# Patient Record
Sex: Female | Born: 1950
Health system: Southern US, Community
[De-identification: ages and names within clinical notes are randomized; demographics above are authoritative.]

## PROBLEM LIST (undated history)

## (undated) DIAGNOSIS — R51 Headache: Secondary | ICD-10-CM

## (undated) DIAGNOSIS — N183 Chronic kidney disease, stage 3 unspecified: Secondary | ICD-10-CM

## (undated) DIAGNOSIS — N3641 Hypermobility of urethra: Secondary | ICD-10-CM

## (undated) DIAGNOSIS — Z860101 Personal history of adenomatous and serrated colon polyps: Secondary | ICD-10-CM

## (undated) DIAGNOSIS — M542 Cervicalgia: Secondary | ICD-10-CM

## (undated) DIAGNOSIS — E119 Type 2 diabetes mellitus without complications: Secondary | ICD-10-CM

## (undated) DIAGNOSIS — E7800 Pure hypercholesterolemia, unspecified: Secondary | ICD-10-CM

## (undated) DIAGNOSIS — C50919 Malignant neoplasm of unspecified site of unspecified female breast: Secondary | ICD-10-CM

## (undated) DIAGNOSIS — I10 Essential (primary) hypertension: Secondary | ICD-10-CM

## (undated) DIAGNOSIS — G4733 Obstructive sleep apnea (adult) (pediatric): Secondary | ICD-10-CM

## (undated) DIAGNOSIS — E039 Hypothyroidism, unspecified: Secondary | ICD-10-CM

## (undated) DIAGNOSIS — Z9289 Personal history of other medical treatment: Secondary | ICD-10-CM

## (undated) DIAGNOSIS — R7303 Prediabetes: Secondary | ICD-10-CM

## (undated) DIAGNOSIS — R9389 Abnormal findings on diagnostic imaging of other specified body structures: Secondary | ICD-10-CM

## (undated) DIAGNOSIS — D649 Anemia, unspecified: Secondary | ICD-10-CM

## (undated) DIAGNOSIS — M1990 Unspecified osteoarthritis, unspecified site: Secondary | ICD-10-CM

## (undated) DIAGNOSIS — N814 Uterovaginal prolapse, unspecified: Secondary | ICD-10-CM

## (undated) DIAGNOSIS — M1A9XX0 Chronic gout, unspecified, without tophus (tophi): Secondary | ICD-10-CM

## (undated) DIAGNOSIS — Z86010 Personal history of colonic polyps: Secondary | ICD-10-CM

## (undated) DIAGNOSIS — Z8719 Personal history of other diseases of the digestive system: Secondary | ICD-10-CM

## (undated) DIAGNOSIS — E782 Mixed hyperlipidemia: Secondary | ICD-10-CM

## (undated) DIAGNOSIS — G4730 Sleep apnea, unspecified: Secondary | ICD-10-CM

## (undated) DIAGNOSIS — I441 Atrioventricular block, second degree: Secondary | ICD-10-CM

## (undated) HISTORY — DX: Personal history of other medical treatment: Z92.89

## (undated) HISTORY — PX: ABDOMINAL HYSTERECTOMY: SHX81

## (undated) HISTORY — DX: Atrioventricular block, second degree: I44.1

## (undated) HISTORY — PX: REPAIR TENDONS FOOT: SUR1209

---

## 1972-07-28 HISTORY — PX: TUBAL LIGATION: SHX77

## 1980-07-28 DIAGNOSIS — C50919 Malignant neoplasm of unspecified site of unspecified female breast: Secondary | ICD-10-CM

## 1980-07-28 DIAGNOSIS — C801 Malignant (primary) neoplasm, unspecified: Secondary | ICD-10-CM

## 1980-07-28 DIAGNOSIS — Z853 Personal history of malignant neoplasm of breast: Secondary | ICD-10-CM

## 1980-07-28 HISTORY — DX: Personal history of malignant neoplasm of breast: Z85.3

## 1980-07-28 HISTORY — DX: Malignant neoplasm of unspecified site of unspecified female breast: C50.919

## 1980-07-28 HISTORY — DX: Malignant (primary) neoplasm, unspecified: C80.1

## 1980-07-28 HISTORY — PX: MASTECTOMY: SHX3

## 1981-07-28 HISTORY — PX: BREAST RECONSTRUCTION: SHX9

## 1987-07-29 HISTORY — PX: OTHER SURGICAL HISTORY: SHX169

## 1997-07-28 HISTORY — PX: REPAIR TENDONS FOOT: SUR1209

## 1998-07-28 HISTORY — PX: REPAIR TENDONS FOOT: SUR1209

## 1999-06-03 ENCOUNTER — Other Ambulatory Visit: Admission: RE | Admit: 1999-06-03 | Discharge: 1999-06-03 | Payer: Self-pay | Admitting: Gynecology

## 2000-06-11 ENCOUNTER — Other Ambulatory Visit: Admission: RE | Admit: 2000-06-11 | Discharge: 2000-06-11 | Payer: Self-pay | Admitting: Gynecology

## 2001-05-19 ENCOUNTER — Encounter (HOSPITAL_BASED_OUTPATIENT_CLINIC_OR_DEPARTMENT_OTHER): Payer: Self-pay | Admitting: Internal Medicine

## 2001-05-19 ENCOUNTER — Encounter: Admission: RE | Admit: 2001-05-19 | Discharge: 2001-05-19 | Payer: Self-pay | Admitting: Internal Medicine

## 2001-06-29 ENCOUNTER — Ambulatory Visit (HOSPITAL_COMMUNITY): Admission: RE | Admit: 2001-06-29 | Discharge: 2001-06-29 | Payer: Self-pay | Admitting: Gastroenterology

## 2001-09-30 ENCOUNTER — Other Ambulatory Visit: Admission: RE | Admit: 2001-09-30 | Discharge: 2001-09-30 | Payer: Self-pay | Admitting: Gynecology

## 2002-10-31 ENCOUNTER — Other Ambulatory Visit: Admission: RE | Admit: 2002-10-31 | Discharge: 2002-10-31 | Payer: Self-pay | Admitting: Gynecology

## 2003-11-02 ENCOUNTER — Other Ambulatory Visit: Admission: RE | Admit: 2003-11-02 | Discharge: 2003-11-02 | Payer: Self-pay | Admitting: Gynecology

## 2004-12-05 ENCOUNTER — Other Ambulatory Visit: Admission: RE | Admit: 2004-12-05 | Discharge: 2004-12-05 | Payer: Self-pay | Admitting: Gynecology

## 2005-07-15 ENCOUNTER — Encounter: Admission: RE | Admit: 2005-07-15 | Discharge: 2005-07-15 | Payer: Self-pay | Admitting: Internal Medicine

## 2011-06-10 ENCOUNTER — Other Ambulatory Visit: Payer: Self-pay | Admitting: Gynecology

## 2011-06-10 ENCOUNTER — Ambulatory Visit
Admission: RE | Admit: 2011-06-10 | Discharge: 2011-06-10 | Disposition: A | Discharge status: Home / Self Care | Payer: Federal, State, Local not specified - PPO | Source: Ambulatory Visit | Attending: Gynecology | Admitting: Gynecology

## 2011-06-10 DIAGNOSIS — R1900 Intra-abdominal and pelvic swelling, mass and lump, unspecified site: Secondary | ICD-10-CM

## 2011-06-17 ENCOUNTER — Other Ambulatory Visit: Payer: Self-pay | Admitting: Gastroenterology

## 2011-06-17 DIAGNOSIS — K7689 Other specified diseases of liver: Secondary | ICD-10-CM

## 2011-06-26 ENCOUNTER — Ambulatory Visit
Admission: RE | Admit: 2011-06-26 | Discharge: 2011-06-26 | Disposition: A | Discharge status: Home / Self Care | Payer: Federal, State, Local not specified - PPO | Source: Ambulatory Visit | Attending: Gastroenterology | Admitting: Gastroenterology

## 2011-06-26 DIAGNOSIS — K7689 Other specified diseases of liver: Secondary | ICD-10-CM

## 2011-06-26 MED ORDER — GADOBENATE DIMEGLUMINE 529 MG/ML IV SOLN
20.0000 mL | Freq: Once | INTRAVENOUS | Status: AC | PRN
  Administered 2011-06-26: 20 mL via INTRAVENOUS

## 2011-07-29 HISTORY — PX: COLONOSCOPY: SHX174

## 2011-09-22 ENCOUNTER — Other Ambulatory Visit: Payer: Self-pay | Admitting: Gynecology

## 2011-09-22 DIAGNOSIS — R1900 Intra-abdominal and pelvic swelling, mass and lump, unspecified site: Secondary | ICD-10-CM

## 2011-09-25 ENCOUNTER — Ambulatory Visit
Admission: RE | Admit: 2011-09-25 | Discharge: 2011-09-25 | Disposition: A | Discharge status: Home / Self Care | Payer: Federal, State, Local not specified - PPO | Source: Ambulatory Visit | Attending: Gynecology | Admitting: Gynecology

## 2011-09-25 DIAGNOSIS — R1900 Intra-abdominal and pelvic swelling, mass and lump, unspecified site: Secondary | ICD-10-CM

## 2011-10-15 ENCOUNTER — Other Ambulatory Visit: Payer: Self-pay | Admitting: Obstetrics and Gynecology

## 2011-10-24 ENCOUNTER — Encounter (HOSPITAL_COMMUNITY): Payer: Self-pay

## 2011-11-06 ENCOUNTER — Encounter (HOSPITAL_COMMUNITY): Payer: Self-pay

## 2011-11-06 ENCOUNTER — Encounter (HOSPITAL_COMMUNITY)
Admission: RE | Admit: 2011-11-06 | Discharge: 2011-11-06 | Disposition: A | Discharge status: Home / Self Care | Payer: Federal, State, Local not specified - PPO | Source: Ambulatory Visit | Attending: Obstetrics and Gynecology | Admitting: Obstetrics and Gynecology

## 2011-11-06 HISTORY — DX: Pure hypercholesterolemia, unspecified: E78.00

## 2011-11-06 HISTORY — DX: Essential (primary) hypertension: I10

## 2011-11-06 LAB — BASIC METABOLIC PANEL
CO2: 25 mEq/L (ref 19–32)
Calcium: 9.9 mg/dL (ref 8.4–10.5)
Chloride: 109 mEq/L (ref 96–112)
Potassium: 3.7 mEq/L (ref 3.5–5.1)
Sodium: 143 mEq/L (ref 135–145)

## 2011-11-06 LAB — APTT: aPTT: 29 seconds (ref 24–37)

## 2011-11-06 LAB — CBC
MCV: 84.3 fL (ref 78.0–100.0)
Platelets: 206 10*3/uL (ref 150–400)
RBC: 4.33 MIL/uL (ref 3.87–5.11)
WBC: 6.3 10*3/uL (ref 4.0–10.5)

## 2011-11-06 LAB — PROTIME-INR: Prothrombin Time: 13.3 seconds (ref 11.6–15.2)

## 2011-11-06 NOTE — Pre-Procedure Instructions (Addendum)
Kathryn Diaz REVIEWED PT EKG AND HISTORY-NO EKG TO COMPARE WITH-REQUESTED PT BE SEEN BY PRIMARY CARE PHYSICIAN. EKG GIVEN TO PT TO TAKE TO Kathryn Diaz OFFICE. Kathryn Diaz OFFICE CALLED AND SPOKE WITH NURSE DJ.-PT HAS APPOINTMENT 11/07/11 AT 11:15AM CALLED Kathryn Diaz OFFICE-NOTIFIED Kathryn Diaz PT NEEDS CLEARANCE -WILL F/U ON Monday WITH Kathryn Kathryn Diaz OFFICE

## 2011-11-06 NOTE — Patient Instructions (Addendum)
   Your procedure is scheduled on: Tuesday April 16TH  Enter through the [employer] of [facility] at: 6AM Pick up the phone at the desk and dial [phone] and inform us of your arrival.  Please call this number if you have any problems the morning of surgery: [phone]  Remember: Do not eat food after midnight:MONDAY Do not drink clear liquids after: MIDNIGHT MONDAY Take these medicines the morning of surgery with a SIP OF WATER: DIOVAN. HOLD METFORMIN Monday EVENING.  Do not wear jewelry, make-up, or FINGER nail polish Do not wear lotions, powders, perfumes or deodorant. Do not shave 48 hours prior to surgery. Do not bring valuables to the hospital. Contacts, dentures or bridgework may not be worn into surgery.  Leave suitcase in the car. After Surgery it may be brought to your room. For patients being admitted to the hospital, checkout time is 11:00am the day of discharge.  Patients discharged on the day of surgery will not be allowed to drive home.     Remember to use your hibiclens as instructed.Please shower with 1/2 bottle the evening before your surgery and the other 1/2 bottle the morning of surgery. Neck down avoiding private area.

## 2011-11-10 MED ORDER — CEFAZOLIN SODIUM-DEXTROSE 2-3 GM-% IV SOLR
2.0000 g | INTRAVENOUS | Status: AC
  Administered 2011-11-11: 2 g via INTRAVENOUS
  Filled 2011-11-10: qty 50

## 2011-11-10 NOTE — H&P (Signed)
Kathryn Diaz is an 61 y.o. female noted to have a 6.2  cm cystic left adnexal mass that has persisted since originally being found in November 2012. The patient has had a recent CA 125 level drawn on September 25, 2011 which was 5U/ml. In view of this persistent adnexal complex mass she is now being taken to the operating room to undergo a bilateral laparoscopic salpino ophorectomy if laparotomy is indicated then a hysterectomy will also be done. The patient has had a negative endometrial biopsy and on April 12,2013 she received medical clearance by Dr. Jarome Matin.  Pertinent Gynecological History:  Last mammogram: normal Date: 03/28/2011  Last pap: normal Date: 05/23/2010 OB History: G2, P2002  Menstrual History:  No LMP recorded.    Past Medical History  Diagnosis Date  . Hypertension   . Hypercholesteremia   . Diabetes mellitus     TYPE 2-METFORMIN    Past Surgical History  Procedure Date  . Mastectomy 1982    RIGHT BREAST   . Breast reconstruction 1983  . Colonoscopy 2013  . Right ankle repair 1989    No family history on file.  Social History:  reports that she has never smoked. She does not have any smokeless tobacco history on file. She reports that she does not drink alcohol or use illicit drugs.  Allergies: No Known Allergies  No prescriptions prior to admission    ROS  Respiratory: No SOB or cough, GI: no nausea, vomiting constipation or diarrhea. No melena GU: No dysuria, frequency or urgency. No hematuria Gyn: No bleeding, discharge, itching or pelvic pain   There were no vitals taken for this visit. Physical Exam  Afebrile   BP 144/84  Pulse 84   Respiration 16 Head: Normocephalic and atraumatic Neck: supple no JVD no increase thyroid Heart: regular rhythm no murmur or gallop Abdomen: soft non tender without enlargement of the liver, kidneys or spleen Back: No CVA tenderness Pelvic Exam:   External Genitalia:  WNL  BUS: within normal limits                          Vagina: without lesion                         Cervix: no tender without lesion                           Uterus: Nontender, anterior                          Adnexa: fullness on left slightly tender  Neurologic exam: Grossly intact   No results found for this or any previous visit (from the past 24 hour(s)).  No results found.  Assessment/Plan: Complex left adnexal mass  Plan: laparoscopic bilateral salpingo ophorectomy if laparotomy is need with also proceed with hysterectomy.Risks and benefits of the procedure were discussed with the patient  [facility] 11/10/2011, 6:23 PM

## 2011-11-11 ENCOUNTER — Encounter (HOSPITAL_COMMUNITY): Payer: Self-pay | Admitting: *Deleted

## 2011-11-11 ENCOUNTER — Ambulatory Visit (HOSPITAL_COMMUNITY): Payer: Federal, State, Local not specified - PPO | Admitting: Anesthesiology

## 2011-11-11 ENCOUNTER — Ambulatory Visit (HOSPITAL_COMMUNITY)
Admission: RE | Admit: 2011-11-11 | Discharge: 2011-11-12 | Disposition: A | Discharge status: Home / Self Care | Payer: Federal, State, Local not specified - PPO | Source: Ambulatory Visit | Attending: Obstetrics and Gynecology | Admitting: Obstetrics and Gynecology

## 2011-11-11 ENCOUNTER — Encounter (HOSPITAL_COMMUNITY)
Admission: RE | Disposition: A | Discharge status: Home / Self Care | Payer: Self-pay | Source: Ambulatory Visit | Attending: Obstetrics and Gynecology

## 2011-11-11 ENCOUNTER — Encounter (HOSPITAL_COMMUNITY): Payer: Self-pay | Admitting: Anesthesiology

## 2011-11-11 DIAGNOSIS — Z01818 Encounter for other preprocedural examination: Secondary | ICD-10-CM | POA: Insufficient documentation

## 2011-11-11 DIAGNOSIS — I10 Essential (primary) hypertension: Secondary | ICD-10-CM | POA: Insufficient documentation

## 2011-11-11 DIAGNOSIS — Z01812 Encounter for preprocedural laboratory examination: Secondary | ICD-10-CM | POA: Insufficient documentation

## 2011-11-11 DIAGNOSIS — N9489 Other specified conditions associated with female genital organs and menstrual cycle: Principal | ICD-10-CM | POA: Insufficient documentation

## 2011-11-11 DIAGNOSIS — E119 Type 2 diabetes mellitus without complications: Secondary | ICD-10-CM | POA: Insufficient documentation

## 2011-11-11 DIAGNOSIS — D279 Benign neoplasm of unspecified ovary: Secondary | ICD-10-CM | POA: Insufficient documentation

## 2011-11-11 HISTORY — PX: SALPINGOOPHORECTOMY: SHX82

## 2011-11-11 HISTORY — PX: LAPAROSCOPY: SHX197

## 2011-11-11 LAB — GLUCOSE, CAPILLARY
Glucose-Capillary: 126 mg/dL — ABNORMAL HIGH (ref 70–99)
Glucose-Capillary: 103 mg/dL — ABNORMAL HIGH (ref 70–99)

## 2011-11-11 LAB — HEMOGLOBIN AND HEMATOCRIT, BLOOD
HCT: 35.8 % — ABNORMAL LOW (ref 36.0–46.0)
Hemoglobin: 11.7 g/dL — ABNORMAL LOW (ref 12.0–15.0)

## 2011-11-11 SURGERY — LAPAROSCOPY OPERATIVE
Anesthesia: General | Site: Abdomen | Wound class: Clean Contaminated

## 2011-11-11 MED ORDER — SIMETHICONE 80 MG PO CHEW: 80.0000 mg | CHEWABLE_TABLET | Freq: Four times a day (QID) | ORAL | Status: DC | PRN

## 2011-11-11 MED ORDER — ONDANSETRON HCL 4 MG/2ML IJ SOLN: 4.0000 mg | Freq: Four times a day (QID) | INTRAMUSCULAR | Status: DC | PRN

## 2011-11-11 MED ORDER — GLYCOPYRROLATE 0.2 MG/ML IJ SOLN
INTRAMUSCULAR | Status: AC
  Filled 2011-11-11: qty 2

## 2011-11-11 MED ORDER — ONDANSETRON HCL 4 MG/2ML IJ SOLN
INTRAMUSCULAR | Status: AC
  Filled 2011-11-11: qty 2

## 2011-11-11 MED ORDER — MORPHINE SULFATE (PF) 1 MG/ML IV SOLN
INTRAVENOUS | Status: DC
  Administered 2011-11-11: 3 mg via INTRAVENOUS
  Administered 2011-11-11: 1 mL via INTRAVENOUS
  Administered 2011-11-11: 10:00:00 via INTRAVENOUS
  Administered 2011-11-11: 2 mL via INTRAVENOUS
  Administered 2011-11-12: 2 mg via INTRAVENOUS
  Administered 2011-11-12: 2 mL via INTRAVENOUS
  Administered 2011-11-12: 2 mg via INTRAVENOUS
  Filled 2011-11-11: qty 25

## 2011-11-11 MED ORDER — BUPIVACAINE HCL (PF) 0.25 % IJ SOLN
INTRAMUSCULAR | Status: DC | PRN
  Administered 2011-11-11: 10 mL

## 2011-11-11 MED ORDER — DIPHENHYDRAMINE HCL 12.5 MG/5ML PO ELIX: 12.5000 mg | ORAL_SOLUTION | Freq: Four times a day (QID) | ORAL | Status: DC | PRN

## 2011-11-11 MED ORDER — ROCURONIUM BROMIDE 100 MG/10ML IV SOLN
INTRAVENOUS | Status: DC | PRN
  Administered 2011-11-11: 40 mg via INTRAVENOUS

## 2011-11-11 MED ORDER — CEFAZOLIN SODIUM 1-5 GM-% IV SOLN
INTRAVENOUS | Status: AC
  Filled 2011-11-11: qty 50

## 2011-11-11 MED ORDER — ROCURONIUM BROMIDE 50 MG/5ML IV SOLN
INTRAVENOUS | Status: AC
  Filled 2011-11-11: qty 1

## 2011-11-11 MED ORDER — NEOSTIGMINE METHYLSULFATE 1 MG/ML IJ SOLN
INTRAMUSCULAR | Status: DC | PRN
  Administered 2011-11-11: 3 mg via INTRAVENOUS

## 2011-11-11 MED ORDER — LIDOCAINE HCL (CARDIAC) 20 MG/ML IV SOLN
INTRAVENOUS | Status: AC
  Filled 2011-11-11: qty 5

## 2011-11-11 MED ORDER — INDIGOTINDISULFONATE SODIUM 8 MG/ML IJ SOLN
INTRAMUSCULAR | Status: AC
  Filled 2011-11-11: qty 5

## 2011-11-11 MED ORDER — LACTATED RINGERS IV SOLN
INTRAVENOUS | Status: DC
  Administered 2011-11-11 (×2): via INTRAVENOUS

## 2011-11-11 MED ORDER — NALOXONE HCL 0.4 MG/ML IJ SOLN: 0.4000 mg | INTRAMUSCULAR | Status: DC | PRN

## 2011-11-11 MED ORDER — HYDROMORPHONE HCL PF 1 MG/ML IJ SOLN
INTRAMUSCULAR | Status: AC
  Administered 2011-11-11: 0.5 mg via INTRAVENOUS
  Filled 2011-11-11: qty 1

## 2011-11-11 MED ORDER — ALUM & MAG HYDROXIDE-SIMETH 200-200-20 MG/5ML PO SUSP: 30.0000 mL | ORAL | Status: DC | PRN

## 2011-11-11 MED ORDER — BUPIVACAINE HCL (PF) 0.25 % IJ SOLN
INTRAMUSCULAR | Status: AC
  Filled 2011-11-11: qty 30

## 2011-11-11 MED ORDER — MIDAZOLAM HCL 2 MG/2ML IJ SOLN
INTRAMUSCULAR | Status: AC
  Filled 2011-11-11: qty 2

## 2011-11-11 MED ORDER — LACTATED RINGERS IR SOLN
Status: DC | PRN
  Administered 2011-11-11: 3000 mL

## 2011-11-11 MED ORDER — PROPOFOL 10 MG/ML IV EMUL
INTRAVENOUS | Status: AC
  Filled 2011-11-11: qty 20

## 2011-11-11 MED ORDER — SODIUM CHLORIDE 0.9 % IJ SOLN: 9.0000 mL | INTRAMUSCULAR | Status: DC | PRN

## 2011-11-11 MED ORDER — FENTANYL CITRATE 0.05 MG/ML IJ SOLN
INTRAMUSCULAR | Status: AC
  Filled 2011-11-11: qty 5

## 2011-11-11 MED ORDER — ZOLPIDEM TARTRATE 5 MG PO TABS: 5.0000 mg | ORAL_TABLET | Freq: Every evening | ORAL | Status: DC | PRN

## 2011-11-11 MED ORDER — GLYCOPYRROLATE 0.2 MG/ML IJ SOLN
INTRAMUSCULAR | Status: DC | PRN
  Administered 2011-11-11: 0.6 mg via INTRAVENOUS

## 2011-11-11 MED ORDER — HYDROMORPHONE HCL PF 1 MG/ML IJ SOLN
0.2500 mg | INTRAMUSCULAR | Status: DC | PRN
  Administered 2011-11-11 (×4): 0.5 mg via INTRAVENOUS

## 2011-11-11 MED ORDER — LIDOCAINE HCL (CARDIAC) 20 MG/ML IV SOLN
INTRAVENOUS | Status: DC | PRN
  Administered 2011-11-11: 80 mg via INTRAVENOUS

## 2011-11-11 MED ORDER — MIDAZOLAM HCL 5 MG/5ML IJ SOLN
INTRAMUSCULAR | Status: DC | PRN
  Administered 2011-11-11: 1 mg via INTRAVENOUS

## 2011-11-11 MED ORDER — ONDANSETRON HCL 4 MG/2ML IJ SOLN
INTRAMUSCULAR | Status: DC | PRN
  Administered 2011-11-11: 4 mg via INTRAVENOUS

## 2011-11-11 MED ORDER — LACTATED RINGERS IV SOLN
INTRAVENOUS | Status: DC
  Administered 2011-11-11 (×2): via INTRAVENOUS

## 2011-11-11 MED ORDER — DIPHENHYDRAMINE HCL 50 MG/ML IJ SOLN: 12.5000 mg | Freq: Four times a day (QID) | INTRAMUSCULAR | Status: DC | PRN

## 2011-11-11 MED ORDER — IBUPROFEN 600 MG PO TABS
600.0000 mg | ORAL_TABLET | Freq: Four times a day (QID) | ORAL | Status: DC | PRN
  Administered 2011-11-12: 600 mg via ORAL
  Filled 2011-11-11: qty 1

## 2011-11-11 MED ORDER — PROPOFOL 10 MG/ML IV EMUL
INTRAVENOUS | Status: DC | PRN
  Administered 2011-11-11: 150 mg via INTRAVENOUS

## 2011-11-11 MED ORDER — FENTANYL CITRATE 0.05 MG/ML IJ SOLN
INTRAMUSCULAR | Status: DC | PRN
  Administered 2011-11-11 (×5): 50 ug via INTRAVENOUS

## 2011-11-11 MED ORDER — NEOSTIGMINE METHYLSULFATE 1 MG/ML IJ SOLN
INTRAMUSCULAR | Status: AC
  Filled 2011-11-11: qty 10

## 2011-11-11 SURGICAL SUPPLY — 44 items
BLADE SURG 15 STRL LF C SS BP (BLADE) ×3 IMPLANT
BLADE SURG 15 STRL SS (BLADE) ×1
CABLE HIGH FREQUENCY MONO STRZ (ELECTRODE) IMPLANT
CANISTER SUCTION 2500CC (MISCELLANEOUS) ×4 IMPLANT
CATH ROBINSON RED A/P 16FR (CATHETERS) IMPLANT
CLOTH BEACON ORANGE TIMEOUT ST (SAFETY) ×4 IMPLANT
CONT PATH 16OZ SNAP LID 3702 (MISCELLANEOUS) ×4 IMPLANT
COVER TABLE BACK 60X90 (DRAPES) ×4 IMPLANT
DECANTER SPIKE VIAL GLASS SM (MISCELLANEOUS) IMPLANT
DRSG COVADERM PLUS 2X2 (GAUZE/BANDAGES/DRESSINGS) ×8 IMPLANT
EVACUATOR SMOKE 8.L (FILTER) IMPLANT
FORCEPS CUTTING 33CM 5MM (CUTTING FORCEPS) ×4 IMPLANT
GLOVE BIO SURGEON STRL SZ7.5 (GLOVE) ×8 IMPLANT
GOWN PREVENTION PLUS LG XLONG (DISPOSABLE) ×8 IMPLANT
GOWN PREVENTION PLUS XXLARGE (GOWN DISPOSABLE) ×4 IMPLANT
NEEDLE HYPO 25X1 1.5 SAFETY (NEEDLE) ×4 IMPLANT
NS IRRIG 1000ML POUR BTL (IV SOLUTION) ×4 IMPLANT
PACK ABDOMINAL GYN (CUSTOM PROCEDURE TRAY) IMPLANT
PACK LAPAROSCOPY BASIN (CUSTOM PROCEDURE TRAY) ×4 IMPLANT
PAD OB MATERNITY 4.3X12.25 (PERSONAL CARE ITEMS) ×4 IMPLANT
POUCH SPECIMEN RETRIEVAL 10MM (ENDOMECHANICALS) ×4 IMPLANT
PROTECTOR NERVE ULNAR (MISCELLANEOUS) ×4 IMPLANT
RINGERS IRRIG 1000ML POUR BTL (IV SOLUTION) IMPLANT
SET IRRIG TUBING LAPAROSCOPIC (IRRIGATION / IRRIGATOR) ×4 IMPLANT
SOLUTION ELECTROLUBE (MISCELLANEOUS) IMPLANT
SPONGE GAUZE 2X2 8PLY STRL LF (GAUZE/BANDAGES/DRESSINGS) ×4 IMPLANT
SPONGE LAP 18X18 X RAY DECT (DISPOSABLE) IMPLANT
STAPLER VISISTAT 35W (STAPLE) IMPLANT
SUT VIC AB 0 CT1 18XCR BRD8 (SUTURE) IMPLANT
SUT VIC AB 0 CT1 27 (SUTURE)
SUT VIC AB 0 CT1 27XBRD ANBCTR (SUTURE) IMPLANT
SUT VIC AB 0 CT1 8-18 (SUTURE)
SUT VIC AB 2-0 SH 27 (SUTURE)
SUT VIC AB 2-0 SH 27XBRD (SUTURE) IMPLANT
SUT VICRYL 0 TIES 12 18 (SUTURE) IMPLANT
SUT VICRYL 0 UR6 27IN ABS (SUTURE) ×4 IMPLANT
SUT VICRYL 4-0 PS2 18IN ABS (SUTURE) ×4 IMPLANT
SYR CONTROL 10ML LL (SYRINGE) ×4 IMPLANT
TAPE CLOTH SURG 4X10 WHT LF (GAUZE/BANDAGES/DRESSINGS) ×4 IMPLANT
TOWEL OR 17X24 6PK STRL BLUE (TOWEL DISPOSABLE) ×8 IMPLANT
TRAY FOLEY CATH 14FR (SET/KITS/TRAYS/PACK) ×4 IMPLANT
TROCAR Z-THREAD BLADED 12X100M (TROCAR) ×4 IMPLANT
WARMER LAPAROSCOPE (MISCELLANEOUS) ×4 IMPLANT
WATER STERILE IRR 1000ML POUR (IV SOLUTION) ×4 IMPLANT

## 2011-11-11 NOTE — Transfer of Care (Signed)
Immediate Anesthesia Transfer of Care Note  Patient: Kathryn Diaz  Procedure(s) Performed: Procedure(s) (LRB): LAPAROSCOPY OPERATIVE (N/A) SALPINGO OOPHERECTOMY (Bilateral) LAPAROSCOPIC LYSIS OF ADHESIONS (N/A)  Patient Location: PACU  Anesthesia Type: General  Level of Consciousness: awake, alert  and oriented  Airway & Oxygen Therapy: Patient Spontanous Breathing and Patient connected to nasal cannula oxygen  Post-op Assessment: Report given to PACU RN and Post -op Vital signs reviewed and stable  Post vital signs: stable  Complications: No apparent anesthesia complications

## 2011-11-11 NOTE — Anesthesia Postprocedure Evaluation (Signed)
Anesthesia Post Note  Patient: Kathryn Diaz  Procedure(s) Performed: Procedure(s) (LRB): LAPAROSCOPY OPERATIVE (N/A) SALPINGO OOPHERECTOMY (Bilateral) LAPAROSCOPIC LYSIS OF ADHESIONS (N/A)  Anesthesia type: General  Patient location: PACU  Post pain: Pain level controlled  Post assessment: Post-op Vital signs reviewed  Last Vitals:  Filed Vitals:   11/11/11 0900  BP: 125/69  Pulse: 58  Temp:   Resp: 16    Post vital signs: Reviewed  Level of consciousness: sedated  Complications: No apparent anesthesia complicationsfj

## 2011-11-11 NOTE — Op Note (Signed)
NAME:  Kathryn Diaz, Kathryn Diaz              ACCOUNT NO.:  [id number]  MEDICAL RECORD NO.:  [id number]  LOCATION:  [facility]                          FACILITY:  [facility]  PHYSICIAN:  Miguel Aschoff, M.D.       DATE OF BIRTH:  07/13/1951  DATE OF PROCEDURE:  11/11/2011 DATE OF DISCHARGE:                              OPERATIVE REPORT   PREOPERATIVE DIAGNOSIS:  Complex left adnexal mass.  POSTOPERATIVE DIAGNOSIS:  Complex left adnexal mass.  PROCEDURE:  Diagnostic laparoscopy with bilateral salpingo-oophorectomy.  SURGEON:  Miguel Aschoff, MD.  ASSISTANTLuvenia Redden, MD.  ANESTHESIA:  General.  COMPLICATIONS:  None.  JUSTIFICATION:  The patient is a 61 year old black female, noted to have a 6-cm complex left adnexal mass which has persisted.  The patient has been followed with serial ultrasounds, which have shown the mass to persist.  CA-125 levels have been normal.  In view of the persistent mass however, which could represent an ovarian neoplasm, the patient is being taken to the operating room at this time to undergo bilateral salpingo-oophorectomy, and if laparotomy is indicated, hysterectomy via laparotomy.  The risks and benefits of the procedure have been discussed.  Informed consent has been obtained.  PROCEDURE IN DETAIL:  The patient was taken to the operating room, placed in supine position.  General anesthesia was administered without difficulty.  She was then placed in the dorsal lithotomy position. Prepped and draped in usual sterile fashion.  Foley catheter was inserted.  At this point, a Hulka tenaculum placed through the cervix and placed in supine position.  Foley catheter was inserted.  After this was done, a Hulka tenaculum was placed through the cervix and held. Attention was then directed to the umbilicus where a small infraumbilical incision was made.  The Veress needle was inserted and the abdomen was insufflated with 3 L of CO2.  Following the insufflation, the trocar to  laparoscope was placed followed by laparoscope itself.  Once this was done, 2 accessory ports were established.  The 5-mm port was established in the right lower quadrant and 12-mm port was established in the left lower quadrant again under direct visualization.  At this point, systematic inspection of the abdomen revealed the uterus to be anterior, normal size and shape.  The anterior bladder and peritoneum was unremarkable.  The right tube was normal along its course.  The right ovary was small, but adherent to the right lateral pelvic sidewall.  On the left side, there were mental adhesions adherent to the ovary and tube.  The ovary was also adherent to the left lateral side wall by filmy adhesions.  There was a cystic mass again approximately 6.3 cm in size.  There were no external excrescences noted.  The cul-de-sac was unremarkable.  The appendix was visualized was noted to be within normal limits.  The liver was visualized, appeared to be within normal limits as was the gallbladder. At this point, the gyrus unit was introduced, the adhesions holding the omentum onto the ovary and infundibulopelvic ligament were cauterized and cut, freeing these and restoring the anatomy.  Then, the infundibulopelvic ligament was identified.  The ureter was out of the field and then  the infundibulopelvic ligament was doubly fulgurated, cut, and then dissection continued along the mesovarium ligament with serial cauterizations and cuts until the utero-ovarian ligament was reached.  Then, this was grasped, cauterized, and cut thus freeing the specimen.  Specimen was placed in the cul-de-sac.  The excision site was evaluated and was noted to be hemostatically secure.  Attention was then directed to the right side.  The filmy adhesions holding the right ovary to lateral pelvic sidewall were then taken down without difficulty.  The right infundibulopelvic ligament was identified, doubly cauterized  and then cut, and then similarly the dissection continued along the mesovarian ligament.  This was cauterized and cut followed by the utero- ovarian ligament.  Again, this specimen was freed and placed in the cul- de-sac also.  At this point, an EndoCatch unit was introduced.  The specimens were placed into the EndoCatch bag and brought out through the left lower quadrant incision without difficulty.  Prior to removing the specimen, the pelvis irrigated with saline.  Hemostasis appeared to be excellent.  The specimen was excised.  CO2 was allowed to escape.  The 12-mm port site was closed.  The fascia identified and ligated using a figure-of-8 sutures of 0 Vicryl.  The subcutaneous 0 Vicryl suture was placed and then all the incisions were closed using subcuticular 4-0 Vicryl.  The port sites were then injected with a total of 10 mL of 0.25% Marcaine.  The patient reversed from the anesthetic and brought to the recovery room in satisfactory condition.  The estimated blood loss was minimal.     Miguel Aschoff, M.D.     AR/MEDQ  D:  11/11/2011  T:  11/11/2011  Job:  [id number]

## 2011-11-11 NOTE — Brief Op Note (Signed)
11/11/2011  8:34 AM  PATIENT:  Kathryn Diaz  61 y.o. female  PRE-OPERATIVE DIAGNOSIS:  COMPLEX ADNEXAL MASS  POST-OPERATIVE DIAGNOSIS:  COMPLEX ADNEXAL MASS  PROCEDURE:  Procedure(s) (LRB): LAPAROSCOPY OPERATIVE (N/A) SALPINGO OOPHERECTOMY (Bilateral) LAPAROSCOPIC LYSIS OF ADHESIONS (N/A)  SURGEON:  Surgeon(s) and Role:    * Miguel Aschoff, MD - Primary    * W Lodema Hong, MD - Assisting   ANESTHESIA:   general  EBL:  Total I/O In: 1000 [I.V.:1000] Out: 310 [Urine:300; Blood:10]  BLOOD ADMINISTERED:none  DRAINS: none   LOCAL MEDICATIONS USED:  MARCAINE     SPECIMEN:  Source of Specimen:  bilateral tubes and ovaries  DISPOSITION OF SPECIMEN:  PATHOLOGY  COUNTS:  YES  TOURNIQUET:  * No tourniquets in log *  DICTATION: .Other Dictation: Dictation Number [id number]  PLAN OF CARE: Outpaitent with extended stay  PATIENT DISPOSITION:  PACU - hemodynamically stable.   Delay start of Pharmacological VTE agent (>24hrs) due to surgical blood loss or risk of bleeding: not applicable

## 2011-11-11 NOTE — Anesthesia Preprocedure Evaluation (Addendum)
Anesthesia Evaluation  Patient identified by MRN, date of birth, ID band Patient awake    Reviewed: Allergy & Precautions, H&P , Patient's Chart, lab work & pertinent test results, reviewed documented beta blocker date and time   Airway Mallampati: II TM Distance: >3 FB Neck ROM: full    Dental No notable dental hx.    Pulmonary  breath sounds clear to auscultation  Pulmonary exam normal       Cardiovascular hypertension (DBP 90's today. EKG okay), Pt. on medications Rhythm:regular Rate:Normal     Neuro/Psych    GI/Hepatic   Endo/Other  Diabetes mellitus-, Type obesityPatient pre-diabetic, per her Hx  Renal/GU      Musculoskeletal   Abdominal   Peds  Hematology   Anesthesia Other Findings   Reproductive/Obstetrics                           Anesthesia Physical Anesthesia Plan  ASA: III  Anesthesia Plan: General   Post-op Pain Management:    Induction: Intravenous  Airway Management Planned: Oral ETT  Additional Equipment:   Intra-op Plan:   Post-operative Plan:   Informed Consent: I have reviewed the patients History and Physical, chart, labs and discussed the procedure including the risks, benefits and alternatives for the proposed anesthesia with the patient or authorized representative who has indicated his/her understanding and acceptance.   Dental Advisory Given  Plan Discussed with: CRNA and Surgeon  Anesthesia Plan Comments: (  Discussed  general anesthesia, including possible nausea, instrumentation of airway, sore throat,pulmonary aspiration, etc. I asked if the were any outstanding questions, or  concerns before we proceeded. )        Anesthesia Quick Evaluation

## 2011-11-11 NOTE — Progress Notes (Signed)
11/11/11 1137  Clinical Encounter Type  Visited With Patient and family together (Two sisters)  Visit Type Initial;Spiritual support  Referral From Nurse  Spiritual Encounters  Spiritual Needs Emotional    Kathryn Diaz was tired and groggy, but welcoming, when I visited to offer chaplain support.  She reports being emotionally and geographically close to family; she and her three sisters talk regularly (sometimes more than once daily).    No needs besides rest at this time, but very pleased to be checked on.  She is aware of ongoing chaplain availability.  Avis Epley, [place] Chaplain [phone]

## 2011-11-12 ENCOUNTER — Encounter (HOSPITAL_COMMUNITY): Payer: Self-pay | Admitting: Obstetrics and Gynecology

## 2011-11-12 LAB — CBC
MCH: 27.4 pg (ref 26.0–34.0)
MCHC: 32.3 g/dL (ref 30.0–36.0)
MCV: 84.9 fL (ref 78.0–100.0)
Platelets: 200 10*3/uL (ref 150–400)
RBC: 4.16 MIL/uL (ref 3.87–5.11)

## 2011-11-12 MED ORDER — ZOLPIDEM TARTRATE 5 MG PO TABS: 5.0000 mg | ORAL_TABLET | Freq: Every evening | ORAL | Status: DC | PRN

## 2011-11-12 NOTE — Progress Notes (Signed)
Pt d/c home    Teaching complete  Out in wheelchair  

## 2011-11-12 NOTE — Discharge Instructions (Signed)
Call for any problems such as fever worsening pain or problems with the wounds. Call for pathoolgy report on 11/13/2011  Resume  Prior medications  Take Vicodin one every 3 to 4 hours for pain if needed  You can drive as of April 22

## 2011-11-12 NOTE — Progress Notes (Signed)
S: Stable this Am. No BM yet but has tolerated her diet  O: Afebrile   BP: 142/82    Pulse 73  Abdomen: soft wounds clean and dry  HG  11.4  A: Stable S/P laparoscopic BSO  Plan: D/C home           RTC in 4 weeks           To call for pathology report on 11/13/2011            Regular diet            Meds: Vicodin one every 4 hours as needed for pain            Condition improved.

## 2011-12-27 DIAGNOSIS — I441 Atrioventricular block, second degree: Secondary | ICD-10-CM

## 2011-12-27 HISTORY — DX: Atrioventricular block, second degree: I44.1

## 2011-12-27 HISTORY — PX: TRANSTHORACIC ECHOCARDIOGRAM: SHX275

## 2012-01-15 ENCOUNTER — Inpatient Hospital Stay (HOSPITAL_COMMUNITY)
Admission: EM | Admit: 2012-01-15 | Discharge: 2012-01-16 | DRG: 143 | Disposition: A | Discharge status: Home / Self Care | Payer: Federal, State, Local not specified - PPO | Source: Ambulatory Visit | Attending: Cardiovascular Disease | Admitting: Cardiovascular Disease

## 2012-01-15 ENCOUNTER — Encounter (HOSPITAL_COMMUNITY): Payer: Self-pay | Admitting: *Deleted

## 2012-01-15 DIAGNOSIS — R079 Chest pain, unspecified: Secondary | ICD-10-CM

## 2012-01-15 DIAGNOSIS — Z23 Encounter for immunization: Secondary | ICD-10-CM

## 2012-01-15 DIAGNOSIS — E785 Hyperlipidemia, unspecified: Secondary | ICD-10-CM

## 2012-01-15 DIAGNOSIS — I10 Essential (primary) hypertension: Secondary | ICD-10-CM

## 2012-01-15 DIAGNOSIS — E669 Obesity, unspecified: Secondary | ICD-10-CM | POA: Diagnosis present

## 2012-01-15 DIAGNOSIS — I441 Atrioventricular block, second degree: Secondary | ICD-10-CM | POA: Diagnosis present

## 2012-01-15 DIAGNOSIS — E119 Type 2 diabetes mellitus without complications: Secondary | ICD-10-CM | POA: Diagnosis present

## 2012-01-15 DIAGNOSIS — I219 Acute myocardial infarction, unspecified: Secondary | ICD-10-CM

## 2012-01-15 DIAGNOSIS — E039 Hypothyroidism, unspecified: Secondary | ICD-10-CM | POA: Diagnosis present

## 2012-01-15 DIAGNOSIS — R001 Bradycardia, unspecified: Secondary | ICD-10-CM | POA: Diagnosis not present

## 2012-01-15 DIAGNOSIS — R0789 Other chest pain: Principal | ICD-10-CM | POA: Diagnosis present

## 2012-01-15 DIAGNOSIS — Z853 Personal history of malignant neoplasm of breast: Secondary | ICD-10-CM

## 2012-01-15 DIAGNOSIS — I498 Other specified cardiac arrhythmias: Secondary | ICD-10-CM | POA: Diagnosis present

## 2012-01-15 DIAGNOSIS — Z6836 Body mass index (BMI) 36.0-36.9, adult: Secondary | ICD-10-CM

## 2012-01-15 DIAGNOSIS — E7800 Pure hypercholesterolemia, unspecified: Secondary | ICD-10-CM | POA: Diagnosis present

## 2012-01-15 HISTORY — DX: Hypothyroidism, unspecified: E03.9

## 2012-01-15 HISTORY — DX: Headache: R51

## 2012-01-15 HISTORY — DX: Malignant neoplasm of unspecified site of unspecified female breast: C50.919

## 2012-01-15 HISTORY — DX: Type 2 diabetes mellitus without complications: E11.9

## 2012-01-15 LAB — DIFFERENTIAL
Basophils Absolute: 0 10*3/uL (ref 0.0–0.1)
Basophils Relative: 0 % (ref 0–1)
Eosinophils Relative: 1 % (ref 0–5)
Monocytes Absolute: 0.5 10*3/uL (ref 0.1–1.0)
Neutro Abs: 5.6 10*3/uL (ref 1.7–7.7)

## 2012-01-15 LAB — CBC
HCT: 37.7 % (ref 36.0–46.0)
MCHC: 32.9 g/dL (ref 30.0–36.0)
MCV: 83.8 fL (ref 78.0–100.0)
RDW: 15.3 % (ref 11.5–15.5)

## 2012-01-15 LAB — POCT I-STAT, CHEM 8
HCT: 39 % (ref 36.0–46.0)
Hemoglobin: 13.3 g/dL (ref 12.0–15.0)
Sodium: 145 mEq/L (ref 135–145)
TCO2: 26 mmol/L (ref 0–100)

## 2012-01-15 LAB — BASIC METABOLIC PANEL
Calcium: 9.7 mg/dL (ref 8.4–10.5)
Creatinine, Ser: 0.96 mg/dL (ref 0.50–1.10)
GFR calc Af Amer: 72 mL/min — ABNORMAL LOW (ref >90)

## 2012-01-15 LAB — TROPONIN I: Troponin I: <0.3 ng/mL (ref <0.30)

## 2012-01-15 LAB — POCT I-STAT TROPONIN I: Troponin i, poc: 0.15 ng/mL (ref 0.00–0.08)

## 2012-01-15 LAB — CK TOTAL AND CKMB (NOT AT ARMC): Total CK: 131 U/L (ref 7–177)

## 2012-01-15 MED ORDER — ACETAMINOPHEN 650 MG RE SUPP: 650.0000 mg | Freq: Four times a day (QID) | RECTAL | Status: DC | PRN

## 2012-01-15 MED ORDER — ACETAMINOPHEN 325 MG PO TABS
650.0000 mg | ORAL_TABLET | Freq: Four times a day (QID) | ORAL | Status: DC | PRN
  Administered 2012-01-16 (×2): 650 mg via ORAL
  Filled 2012-01-15 (×2): qty 2

## 2012-01-15 MED ORDER — SODIUM CHLORIDE 0.9 % IV SOLN
INTRAVENOUS | Status: DC
  Administered 2012-01-15 – 2012-01-16 (×2): via INTRAVENOUS

## 2012-01-15 MED ORDER — HEPARIN (PORCINE) IN NACL 100-0.45 UNIT/ML-% IJ SOLN
12.0000 [IU]/kg/h | INTRAMUSCULAR | Status: DC
  Administered 2012-01-15: 12 [IU]/kg/h via INTRAVENOUS
  Filled 2012-01-15 (×2): qty 250

## 2012-01-15 MED ORDER — MORPHINE SULFATE 2 MG/ML IJ SOLN: 2.0000 mg | INTRAMUSCULAR | Status: DC | PRN

## 2012-01-15 MED ORDER — METFORMIN HCL ER 500 MG PO TB24
500.0000 mg | ORAL_TABLET | Freq: Every day | ORAL | Status: DC
  Filled 2012-01-15 (×2): qty 1

## 2012-01-15 MED ORDER — NITROGLYCERIN 2 % TD OINT
0.5000 [in_us] | TOPICAL_OINTMENT | Freq: Once | TRANSDERMAL | Status: AC
  Administered 2012-01-15: 0.5 [in_us] via TOPICAL
  Filled 2012-01-15: qty 1

## 2012-01-15 MED ORDER — ASPIRIN EC 325 MG PO TBEC
325.0000 mg | DELAYED_RELEASE_TABLET | Freq: Every day | ORAL | Status: DC
  Administered 2012-01-16: 325 mg via ORAL
  Filled 2012-01-15: qty 1

## 2012-01-15 MED ORDER — SODIUM CHLORIDE 0.9 % IJ SOLN: 3.0000 mL | Freq: Two times a day (BID) | INTRAMUSCULAR | Status: DC

## 2012-01-15 MED ORDER — LEVOTHYROXINE SODIUM 112 MCG PO TABS
112.0000 ug | ORAL_TABLET | Freq: Every day | ORAL | Status: DC
  Administered 2012-01-16: 112 ug via ORAL
  Filled 2012-01-15 (×2): qty 1

## 2012-01-15 MED ORDER — ATORVASTATIN CALCIUM 40 MG PO TABS
40.0000 mg | ORAL_TABLET | Freq: Every day | ORAL | Status: DC
  Filled 2012-01-15: qty 1

## 2012-01-15 MED ORDER — ONDANSETRON HCL 4 MG/2ML IJ SOLN: 4.0000 mg | Freq: Four times a day (QID) | INTRAMUSCULAR | Status: DC | PRN

## 2012-01-15 MED ORDER — ASPIRIN 81 MG PO CHEW
324.0000 mg | CHEWABLE_TABLET | Freq: Once | ORAL | Status: AC
  Administered 2012-01-15: 324 mg via ORAL
  Filled 2012-01-15: qty 4

## 2012-01-15 MED ORDER — PNEUMOCOCCAL VAC POLYVALENT 25 MCG/0.5ML IJ INJ
0.5000 mL | INJECTION | INTRAMUSCULAR | Status: AC
  Administered 2012-01-16: 0.5 mL via INTRAMUSCULAR
  Filled 2012-01-15: qty 0.5

## 2012-01-15 MED ORDER — IRBESARTAN 75 MG PO TABS
75.0000 mg | ORAL_TABLET | Freq: Every day | ORAL | Status: DC
  Administered 2012-01-16: 75 mg via ORAL
  Filled 2012-01-15: qty 1

## 2012-01-15 MED ORDER — HEPARIN BOLUS VIA INFUSION
4000.0000 [IU] | Freq: Once | INTRAVENOUS | Status: AC
  Administered 2012-01-15: 4000 [IU] via INTRAVENOUS

## 2012-01-15 MED ORDER — ONDANSETRON HCL 4 MG PO TABS: 4.0000 mg | ORAL_TABLET | Freq: Four times a day (QID) | ORAL | Status: DC | PRN

## 2012-01-15 MED ORDER — DOCUSATE SODIUM 100 MG PO CAPS
100.0000 mg | ORAL_CAPSULE | Freq: Two times a day (BID) | ORAL | Status: DC
  Filled 2012-01-15 (×3): qty 1

## 2012-01-15 MED ORDER — NITROGLYCERIN 2 % TD OINT
1.0000 [in_us] | TOPICAL_OINTMENT | Freq: Four times a day (QID) | TRANSDERMAL | Status: DC
  Administered 2012-01-16 (×2): 1 [in_us] via TOPICAL
  Filled 2012-01-15: qty 30

## 2012-01-15 MED ORDER — ALLOPURINOL 300 MG PO TABS
300.0000 mg | ORAL_TABLET | Freq: Every day | ORAL | Status: DC
  Administered 2012-01-16: 300 mg via ORAL
  Filled 2012-01-15: qty 1

## 2012-01-15 NOTE — ED Notes (Signed)
Critical i-STAT cTnl was shown to Dr. Shela Commons.

## 2012-01-15 NOTE — Progress Notes (Signed)
ANTICOAGULATION CONSULT NOTE - Initial Consult  Pharmacy Consult for heparin Indication: chest pain/ACS  No Known Allergies  Patient Measurements: Height: 5\' 5"  (165.1 cm) Weight: 222 lb (100.699 kg) IBW/kg (Calculated) : 57   Vital Signs: Temp: 98.2 F (36.8 C) (06/20 1731) Temp src: Oral (06/20 1731) BP: 131/85 mmHg (06/20 2100) Pulse Rate: 74  (06/20 2100)  Labs:  Basename 01/15/12 1859 01/15/12 1807 01/15/12 1746  HGB -- 13.3 12.4  HCT -- 39.0 37.7  PLT -- -- 242  APTT -- -- --  LABPROT -- -- --  INR -- -- --  HEPARINUNFRC -- -- --  CREATININE -- 1.00 0.96  CKTOTAL 131 -- --  CKMB 2.2 -- --  TROPONINI <0.30 -- --    Estimated Creatinine Clearance: 69.5 ml/min (by C-G formula based on Cr of 1).   Medical History: Past Medical History  Diagnosis Date  . Hypertension   . Hypercholesteremia   . Diabetes mellitus     TYPE 2-METFORMIN  . Arthritis     Medications:  Prescriptions prior to admission  Medication Sig Dispense Refill  . allopurinol (ZYLOPRIM) 300 MG tablet Take 300 mg by mouth daily.      Marland Kitchen levothyroxine (SYNTHROID, LEVOTHROID) 112 MCG tablet Take 112 mcg by mouth daily.      . metFORMIN (GLUCOPHAGE XR) 500 MG 24 hr tablet Take 500 mg by mouth daily with breakfast.      . simvastatin (ZOCOR) 80 MG tablet Take 80 mg by mouth daily.      . valsartan (DIOVAN) 80 MG tablet Take 80 mg by mouth daily.       Scheduled:    . aspirin  324 mg Oral Once  . heparin  4,000 Units Intravenous Once  . nitroGLYCERIN  0.5 inch Topical Once    Assessment: 61yo female c/o several episodes of substernal CP over the past two days to continue heparin begun in ED.  Goal of Therapy:  Heparin level 0.3-0.7 units/ml Monitor platelets by anticoagulation protocol: Yes   Plan:  Heparin bolus of 4000 units followed by gtt at 950 units/hr begun by EDMD; will continue and monitor heparin levels and CBC.  Colleen Can PharmD BCPS 01/15/2012,10:24 PM

## 2012-01-15 NOTE — ED Notes (Signed)
Pt states that she was having center chest tightness yesterday at work, then it went away. Today the tightness came back, pt denies N/V, SOB, diaphoresis. Pt states inhales to calm self down.

## 2012-01-15 NOTE — ED Provider Notes (Signed)
Plan of several episodes of chest tightness anterior onset yesterday. And also had 3 episodes of chest pain today lasting less than 5 minutes each she is presently asymptomatic. On exam lungs clear auscultation heart regular rate and rhythm abdomen nondistended nontender. In light of cardiac risk factors symptoms and elevated troponin suggestive of acute coronary syndrome.  Doug Sou, MD 01/15/12 [phone]

## 2012-01-15 NOTE — ED Notes (Signed)
MD at bedside. 

## 2012-01-15 NOTE — ED Provider Notes (Signed)
History     CSN: [id number]  Arrival date & time 01/15/12  1703   First MD Initiated Contact with Patient 01/15/12 1817      Chief Complaint  Patient presents with  . Chest Pain    (Consider location/radiation/quality/duration/timing/severity/associated sxs/prior treatment) Patient is a 61 y.o. female presenting with chest pain. The history is provided by the patient, a relative and a friend.  Chest Pain The chest pain began yesterday. Duration of episode(s) is 3 minutes. Chest pain occurs intermittently. The chest pain is resolved. The severity of the pain is moderate. The quality of the pain is described as aching, dull, heavy and brief. The pain does not radiate. Chest pain is worsened by stress. Pertinent negatives for primary symptoms include no fever, no shortness of breath, no cough, no wheezing, no palpitations, no abdominal pain, no nausea and no vomiting. Treatments tried: "deep breathing and hymns" Risk factors include sedentary lifestyle.  Her past medical history is significant for cancer, diabetes, hyperlipidemia and hypertension.  Pertinent negatives for past medical history include no CAD, no DVT, no MI and no PE.     Past Medical History  Diagnosis Date  . Hypertension   . Hypercholesteremia   . Diabetes mellitus     TYPE 2-METFORMIN    Past Surgical History  Procedure Date  . Mastectomy 1982    RIGHT BREAST   . Breast reconstruction 1983  . Colonoscopy 2013  . Right ankle repair 1989  . Laparoscopy 11/11/2011    Procedure: LAPAROSCOPY OPERATIVE;  Surgeon: Miguel Aschoff, MD;  Location: [facility];  Service: Gynecology;  Laterality: N/A;  . Salpingoophorectomy 11/11/2011    Procedure: SALPINGO OOPHERECTOMY;  Surgeon: Miguel Aschoff, MD;  Location: [facility];  Service: Gynecology;  Laterality: Bilateral;    History reviewed. No pertinent family history.  History  Substance Use Topics  . Smoking status: Never Smoker   . Smokeless tobacco: Not on file  . Alcohol Use: No     OB History    Grav Para Term Preterm Abortions TAB SAB Ect Mult Living                  Review of Systems  Constitutional: Negative for fever, chills, activity change and appetite change.  HENT: Negative for neck pain and neck stiffness.   Respiratory: Positive for chest tightness. Negative for cough, shortness of breath and wheezing.   Cardiovascular: Positive for chest pain. Negative for palpitations.  Gastrointestinal: Negative for nausea, vomiting, abdominal pain and constipation.  Genitourinary: Negative for difficulty urinating.  Skin: Negative for rash and wound.  Neurological: Negative for syncope, facial asymmetry and light-headedness.  Psychiatric/Behavioral: Negative for confusion and agitation.  All other systems reviewed and are negative.    Allergies  Review of patient's allergies indicates no known allergies.  Home Medications   Current Outpatient Rx  Name Route Sig Dispense Refill  . ALLOPURINOL 300 MG PO TABS Oral Take 300 mg by mouth daily.    Marland Kitchen LEVOTHYROXINE SODIUM 112 MCG PO TABS Oral Take 112 mcg by mouth daily.    Marland Kitchen METFORMIN HCL ER 500 MG PO TB24 Oral Take 500 mg by mouth daily with breakfast.    . SIMVASTATIN 80 MG PO TABS Oral Take 80 mg by mouth daily.    Marland Kitchen VALSARTAN 80 MG PO TABS Oral Take 80 mg by mouth daily.    Marland Kitchen ZOLPIDEM TARTRATE 5 MG PO TABS Oral Take 1-2 tablets (5-10 mg total) by mouth at bedtime as needed  for sleep. 20 tablet 0    BP 146/87  Pulse 75  Temp 98.2 F (36.8 C) (Oral)  Resp 10  SpO2 100%  Physical Exam  Nursing note and vitals reviewed. Constitutional: She is oriented to person, place, and time. She appears well-developed and well-nourished.  HENT:  Head: Normocephalic and atraumatic.  Right Ear: External ear normal.  Left Ear: External ear normal.  Nose: Nose normal.  Mouth/Throat: Oropharynx is clear and moist. No oropharyngeal exudate.  Eyes: Conjunctivae are normal. Pupils are equal, round, and reactive to  light.  Neck: Normal range of motion. Neck supple.  Cardiovascular: Normal rate, regular rhythm, normal heart sounds and intact distal pulses.  Exam reveals no gallop and no friction rub.   No murmur heard. Pulmonary/Chest: Effort normal. No respiratory distress. She has no wheezes. She has no rales. She exhibits no tenderness.  Abdominal: Soft. Bowel sounds are normal. She exhibits no distension and no mass. There is no tenderness. There is no rebound and no guarding.  Musculoskeletal: Normal range of motion. She exhibits no edema and no tenderness.  Neurological: She is alert and oriented to person, place, and time.  Skin: Skin is warm and dry.  Psychiatric: She has a normal mood and affect. Her behavior is normal. Judgment and thought content normal.    ED Course  Procedures (including critical care time)  Labs Reviewed  BASIC METABOLIC PANEL - Abnormal; Notable for the following:    Potassium 3.4 (*)     Glucose, Bld 102 (*)     GFR calc non Af Amer 63 (*)     GFR calc Af Amer 72 (*)     All other components within normal limits  POCT I-STAT, CHEM 8 - Abnormal; Notable for the following:    Glucose, Bld 102 (*)     All other components within normal limits  POCT I-STAT TROPONIN I - Abnormal; Notable for the following:    Troponin i, poc 0.15 (*)     All other components within normal limits  PRO B NATRIURETIC PEPTIDE - Abnormal; Notable for the following:    Pro B Natriuretic peptide (BNP) 157.9 (*)     All other components within normal limits  CBC  DIFFERENTIAL  TROPONIN I  CK TOTAL AND CKMB  CARDIAC PANEL(CRET KIN+CKTOT+MB+TROPI)  HEPARIN LEVEL (UNFRACTIONATED)  CBC  CBC  CREATININE, SERUM  TSH  CARDIAC PANEL(CRET KIN+CKTOT+MB+TROPI)  COMPREHENSIVE METABOLIC PANEL  CARDIAC PANEL(CRET KIN+CKTOT+MB+TROPI)   No results found.   1. Myocardial infarction      Date: 01/15/2012  Rate: 96 bpm  Rhythm: normal sinus rhythm  QRS Axis: left  Intervals: QT prolonged  (490 ms)  ST/T Wave abnormalities: normal  Conduction Disutrbances:none  Narrative Interpretation: No evidence of acute ischemia or arrythmia  Old EKG Reviewed: Unchanged (11/06/11)    MDM  60 yo F w/no known history of coronary artery disease presents after several episodes of substernal chest pressure over the past two days. No associated symptoms. CXR negative for evidence of dissection or PTX. Aspirin (325mg  PO) administered. Clinical picture (intermittent chest pressure, no dyspnea, tachycardia, or tachypnea) not concerning for PE. EKG not concerning for acute ischemia; however, initial troponin positive; suspect NSTEMI. Pt given heparin bolus and initiated on heparin drip. Pt admitted to Hospitalist service.         Clemetine Marker, MD 01/16/12 [phone]

## 2012-01-15 NOTE — Progress Notes (Addendum)
At 23:10, alerted by monitor tech that patient was having pauses on telemetry.  Telemetry strip currently showing sinus rhythm with 2nd degree, type 1 AV block (Wenkebach).  EKG in ED shows sinus rhythm with 1st degree AV block, incomplete bundle branch block is present.  No ST elevation or depression.  No previous history of any AV block for this patient.  Initial I-STAT troponin I was critical high at 0.15, subsequent cardiac enzymes show no elevation in CK, CKMB or troponin I.  Patient was admitted for chest pain and discomfort over the past several days which is currently resolved.  Possible NSTEMI per EDP, Dr. Ethelda Chick.  Patient is currently receiving heparin at 9.31ml/hr after 4000 unit bolus in ED.  Also received ASA 324mg  PO and nitroglycerin paste on chest wall.  No report of chest pain, SOB, nausea, vomiting, diaphoresis or any discomfort at this time.  Patient is resting comfortably in bed with family at bedside.  Heart rhythm has returned to sinus rhythm with 1st degree AVB on telemetry with rate in the 70s.  Paged MD on-call for Dr. Allyson Sabal with [facility] to make aware of rhythm changes.  Awaiting return call.  Addendum: Spoke to Charmian Muff, NP on-call for Dr. Allyson Sabal.  No new orders.  Continue to monitor heart rate and rhythm, save and document strips.  Notify on-call for sustained bradycardia or 2nd degree AV block.

## 2012-01-16 ENCOUNTER — Encounter (HOSPITAL_COMMUNITY)
Admission: EM | Disposition: A | Discharge status: Home / Self Care | Payer: Self-pay | Source: Ambulatory Visit | Attending: Internal Medicine

## 2012-01-16 DIAGNOSIS — I441 Atrioventricular block, second degree: Secondary | ICD-10-CM | POA: Diagnosis present

## 2012-01-16 DIAGNOSIS — R001 Bradycardia, unspecified: Secondary | ICD-10-CM | POA: Diagnosis not present

## 2012-01-16 LAB — CARDIAC PANEL(CRET KIN+CKTOT+MB+TROPI)
CK, MB: 2 ng/mL (ref 0.3–4.0)
CK, MB: 2 ng/mL (ref 0.3–4.0)
Relative Index: 1.8 (ref 0.0–2.5)
Relative Index: 1.9 (ref 0.0–2.5)
Total CK: 110 U/L (ref 7–177)
Total CK: 98 U/L (ref 7–177)
Troponin I: <0.3 ng/mL (ref <0.30)

## 2012-01-16 LAB — CBC
HCT: 33.1 % — ABNORMAL LOW (ref 36.0–46.0)
Hemoglobin: 11.2 g/dL — ABNORMAL LOW (ref 12.0–15.0)
MCHC: 33.8 g/dL (ref 30.0–36.0)
RBC: 4.02 MIL/uL (ref 3.87–5.11)
WBC: 8.1 10*3/uL (ref 4.0–10.5)

## 2012-01-16 LAB — COMPREHENSIVE METABOLIC PANEL
ALT: 12 U/L (ref 0–35)
Alkaline Phosphatase: 55 U/L (ref 39–117)
CO2: 25 mEq/L (ref 19–32)
Chloride: 109 mEq/L (ref 96–112)
GFR calc Af Amer: 81 mL/min — ABNORMAL LOW (ref >90)
Glucose, Bld: 106 mg/dL — ABNORMAL HIGH (ref 70–99)
Potassium: 3.4 mEq/L — ABNORMAL LOW (ref 3.5–5.1)
Sodium: 142 mEq/L (ref 135–145)
Total Bilirubin: 0.3 mg/dL (ref 0.3–1.2)
Total Protein: 5.8 g/dL — ABNORMAL LOW (ref 6.0–8.3)

## 2012-01-16 LAB — LIPID PANEL
Cholesterol: 136 mg/dL (ref 0–200)
HDL: 50 mg/dL (ref >39)
LDL Cholesterol: 75 mg/dL (ref 0–99)
Triglycerides: 53 mg/dL (ref <150)

## 2012-01-16 LAB — TSH: TSH: 0.3 u[IU]/mL — ABNORMAL LOW (ref 0.350–4.500)

## 2012-01-16 SURGERY — LEFT HEART CATHETERIZATION WITH CORONARY ANGIOGRAM
Anesthesia: LOCAL

## 2012-01-16 MED ORDER — POTASSIUM CHLORIDE CRYS ER 20 MEQ PO TBCR
EXTENDED_RELEASE_TABLET | ORAL | Status: AC
  Filled 2012-01-16: qty 2

## 2012-01-16 MED ORDER — ASPIRIN 325 MG PO TBEC: 325.0000 mg | DELAYED_RELEASE_TABLET | Freq: Every day | ORAL | Status: AC

## 2012-01-16 MED ORDER — POTASSIUM CHLORIDE CRYS ER 20 MEQ PO TBCR
40.0000 meq | EXTENDED_RELEASE_TABLET | Freq: Once | ORAL | Status: AC
  Administered 2012-01-16: 40 meq via ORAL

## 2012-01-16 NOTE — Progress Notes (Signed)
Pt ambulated 50yards in hallway tolerated well. "Feels like normal".

## 2012-01-16 NOTE — H&P (Signed)
Kathryn Diaz is an 61 y.o. female.   Chief Complaint: Chest pain HPI: A 61 year old female with history of hypertension hyperlipidemia and diabetes who has been having intermittent central chest pain for close to 3 weeks. Pain usually lasts a few seconds to minutes. To date escalated as take that for more than out. It was rated as 4-5/10 centrally located no radiation. No relief with medications at home. She had no prior cardiac disease. She came to the emergency room where she was having persistent pain with mildly elevated troponin. The presumption of acute coronary syndrome was made and patient is being admitted to cardiology service. She is currently chest pain-free after nitroglycerin, morphine and heparin. Patient has family history of coronary artery disease one of her brothers had MI in his 19s. She had no history of smoking but had other risk factors including hypertension hyperlipidemia and diabetes. Height EKG in the ED also was normal.  Past Medical History  Diagnosis Date  . Hypertension   . Hypercholesteremia   . Breast cancer   . Hypothyroidism   . Type II diabetes mellitus     "borderline"  . Headache     Past Surgical History  Procedure Date  . Breast reconstruction 1983  . Colonoscopy 2013  . Right ankle repair 1989  . Laparoscopy 11/11/2011    Procedure: LAPAROSCOPY OPERATIVE;  Surgeon: Miguel Aschoff, MD;  Location: [facility];  Service: Gynecology;  Laterality: N/A;  . Salpingoophorectomy 11/11/2011    Procedure: SALPINGO OOPHERECTOMY;  Surgeon: Miguel Aschoff, MD;  Location: [facility];  Service: Gynecology;  Laterality: Bilateral;  . Repair tendons foot ~ 2000    right ankle  . Tubal ligation 1974  . Mastectomy 1982    RIGHT BREAST     History reviewed. No pertinent family history. Social History:  reports that she has never smoked. She has never used smokeless tobacco. She reports that she does not drink alcohol or use illicit drugs.  Allergies: No Known  Allergies  Medications Prior to Admission  Medication Sig Dispense Refill  . allopurinol (ZYLOPRIM) 300 MG tablet Take 300 mg by mouth daily.      Marland Kitchen levothyroxine (SYNTHROID, LEVOTHROID) 112 MCG tablet Take 112 mcg by mouth daily.      . metFORMIN (GLUCOPHAGE XR) 500 MG 24 hr tablet Take 500 mg by mouth daily with breakfast.      . simvastatin (ZOCOR) 80 MG tablet Take 80 mg by mouth daily.      . valsartan (DIOVAN) 80 MG tablet Take 80 mg by mouth daily.        Results for orders placed during the hospital encounter of 01/15/12 (from the past 48 hour(s))  CBC     Status: Normal   Collection Time   01/15/12  5:46 PM      Component Value Range Comment   WBC 9.1  4.0 - 10.5 K/uL    RBC 4.50  3.87 - 5.11 MIL/uL    Hemoglobin 12.4  12.0 - 15.0 g/dL    HCT 96.2  95.2 - 84.1 %    MCV 83.8  78.0 - 100.0 fL    MCH 27.6  26.0 - 34.0 pg    MCHC 32.9  30.0 - 36.0 g/dL    RDW 32.4  40.1 - 02.7 %    Platelets 242  150 - 400 K/uL   DIFFERENTIAL     Status: Normal   Collection Time   01/15/12  5:46 PM  Component Value Range Comment   Neutrophils Relative 62  43 - 77 %    Neutro Abs 5.6  1.7 - 7.7 K/uL    Lymphocytes Relative 31  12 - 46 %    Lymphs Abs 2.8  0.7 - 4.0 K/uL    Monocytes Relative 6  3 - 12 %    Monocytes Absolute 0.5  0.1 - 1.0 K/uL    Eosinophils Relative 1  0 - 5 %    Eosinophils Absolute 0.1  0.0 - 0.7 K/uL    Basophils Relative 0  0 - 1 %    Basophils Absolute 0.0  0.0 - 0.1 K/uL   BASIC METABOLIC PANEL     Status: Abnormal   Collection Time   01/15/12  5:46 PM      Component Value Range Comment   Sodium 145  135 - 145 mEq/L    Potassium 3.4 (*) 3.5 - 5.1 mEq/L    Chloride 107  96 - 112 mEq/L    CO2 27  19 - 32 mEq/L    Glucose, Bld 102 (*) 70 - 99 mg/dL    BUN 21  6 - 23 mg/dL    Creatinine, Ser 1.61  0.50 - 1.10 mg/dL    Calcium 9.7  8.4 - 09.6 mg/dL    GFR calc non Af Amer 63 (*) >90 mL/min    GFR calc Af Amer 72 (*) >90 mL/min   POCT I-STAT TROPONIN I      Status: Abnormal   Collection Time   01/15/12  6:05 PM      Component Value Range Comment   Troponin i, poc 0.15 (*) 0.00 - 0.08 ng/mL    Comment NOTIFIED PHYSICIAN      Comment 3            POCT I-STAT, CHEM 8     Status: Abnormal   Collection Time   01/15/12  6:07 PM      Component Value Range Comment   Sodium 145  135 - 145 mEq/L    Potassium 3.7  3.5 - 5.1 mEq/L    Chloride 106  96 - 112 mEq/L    BUN 23  6 - 23 mg/dL    Creatinine, Ser 0.45  0.50 - 1.10 mg/dL    Glucose, Bld 409 (*) 70 - 99 mg/dL    Calcium, Ion 8.11  9.14 - 1.32 mmol/L    TCO2 26  0 - 100 mmol/L    Hemoglobin 13.3  12.0 - 15.0 g/dL    HCT 78.2  95.6 - 21.3 %   TROPONIN I     Status: Normal   Collection Time   01/15/12  6:59 PM      Component Value Range Comment   Troponin I <0.30  <0.30 ng/mL   CK TOTAL AND CKMB     Status: Normal   Collection Time   01/15/12  6:59 PM      Component Value Range Comment   Total CK 131  7 - 177 U/L    CK, MB 2.2  0.3 - 4.0 ng/mL    Relative Index 1.7  0.0 - 2.5   PRO B NATRIURETIC PEPTIDE     Status: Abnormal   Collection Time   01/15/12 11:09 PM      Component Value Range Comment   Pro B Natriuretic peptide (BNP) 157.9 (*) 0 - 125 pg/mL   CARDIAC PANEL(CRET KIN+CKTOT+MB+TROPI)     Status: Normal  Collection Time   01/15/12 11:09 PM      Component Value Range Comment   Total CK 110  7 - 177 U/L    CK, MB 2.0  0.3 - 4.0 ng/mL    Troponin I <0.30  <0.30 ng/mL    Relative Index 1.8  0.0 - 2.5   HEPARIN LEVEL (UNFRACTIONATED)     Status: Normal   Collection Time   01/16/12  2:13 AM      Component Value Range Comment   Heparin Unfractionated 0.37  0.30 - 0.70 IU/mL   CBC     Status: Abnormal   Collection Time   01/16/12  2:13 AM      Component Value Range Comment   WBC 8.1  4.0 - 10.5 K/uL    RBC 4.02  3.87 - 5.11 MIL/uL    Hemoglobin 11.2 (*) 12.0 - 15.0 g/dL    HCT 47.8 (*) 29.5 - 46.0 %    MCV 82.3  78.0 - 100.0 fL    MCH 27.9  26.0 - 34.0 pg    MCHC 33.8  30.0  - 36.0 g/dL    RDW 62.1  30.8 - 65.7 %    Platelets 201  150 - 400 K/uL   COMPREHENSIVE METABOLIC PANEL     Status: Abnormal   Collection Time   01/16/12  2:16 AM      Component Value Range Comment   Sodium 142  135 - 145 mEq/L    Potassium 3.4 (*) 3.5 - 5.1 mEq/L    Chloride 109  96 - 112 mEq/L    CO2 25  19 - 32 mEq/L    Glucose, Bld 106 (*) 70 - 99 mg/dL    BUN 20  6 - 23 mg/dL    Creatinine, Ser 8.46  0.50 - 1.10 mg/dL    Calcium 9.0  8.4 - 96.2 mg/dL    Total Protein 5.8 (*) 6.0 - 8.3 g/dL    Albumin 3.3 (*) 3.5 - 5.2 g/dL    AST 13  0 - 37 U/L    ALT 12  0 - 35 U/L    Alkaline Phosphatase 55  39 - 117 U/L    Total Bilirubin 0.3  0.3 - 1.2 mg/dL    GFR calc non Af Amer 69 (*) >90 mL/min    GFR calc Af Amer 81 (*) >90 mL/min    No results found.  Review of Systems  Constitutional: Negative.   HENT: Negative.   Eyes: Negative.   Respiratory: Positive for shortness of breath.   Cardiovascular: Positive for chest pain and leg swelling. Negative for palpitations, orthopnea, claudication and PND.  Gastrointestinal: Negative.   Genitourinary: Negative.   Musculoskeletal: Negative.   Skin: Negative.   Neurological: Negative.   Endo/Heme/Allergies: Negative.   Psychiatric/Behavioral: Negative.   All other systems reviewed and are negative.    Blood pressure 169/76, pulse 58, temperature 98.6 F (37 C), temperature source Oral, resp. rate 20, height 5\' 5"  (1.651 m), weight 99.8 kg (220 lb 0.3 oz), SpO2 99.00%. Physical Exam  Constitutional: She is oriented to person, place, and time. She appears well-developed and well-nourished.  HENT:  Head: Normocephalic and atraumatic.  Right Ear: External ear normal.  Left Ear: External ear normal.  Nose: Nose normal.  Mouth/Throat: Oropharynx is clear and moist.  Eyes: EOM are normal. Pupils are equal, round, and reactive to light.  Neck: Normal range of motion. Neck supple.  Cardiovascular: Normal rate, regular rhythm,  normal  heart sounds and intact distal pulses.   Respiratory: Effort normal and breath sounds normal.  GI: Soft. Bowel sounds are normal.  Musculoskeletal: Normal range of motion.  Neurological: She is alert and oriented to person, place, and time. She has normal reflexes.  Skin: Skin is warm and dry.  Psychiatric: She has a normal mood and affect. Her behavior is normal. Judgment and thought content normal.     Assessment/Plan A 61 year old female presenting with chest pain suspicious for angina. Plan #1 chest pain: Patient will be admitted and serial cardiac enzymes checked. We'll follow the trend of her troponin to see if his rise in also look at the CT and CK-MB. Cardiology will follow patient in the morning and make further determination as to whether or not she needs a stress test or a cath. In the meantime on full anticoagulation given nitro paste. Also aspirin morphine and oxygen. Plan #2 diabetes mellitus: I'll continue her medications from home however she is on metformin which we will hold. She'll get sliding scale insulin in the hospital. #3 hypertension: Continue his home medications. #4 hypothyroidism: Continue with her Synthroid from home. #5 Hyperlipidemia: We'll check fasting lipid panel and continue with her Lipitor.  Shuayb Schepers,LAWAL 01/16/2012, 4:53 AM

## 2012-01-16 NOTE — ED Provider Notes (Signed)
I have personally seen and examined the patient.  I have discussed the plan of care with the resident.  I have reviewed the documentation on PMH/FH/Soc. History.  I have reviewed the documentation of the resident and agree.  Doug Sou, MD 01/16/12 [phone]

## 2012-01-16 NOTE — Discharge Instructions (Signed)
Heart healthy diabetic diet.  Call or come to the ER for chest pain, lightheadedness or dizziness.

## 2012-01-16 NOTE — Progress Notes (Signed)
  Echocardiogram 2D Echocardiogram has been performed.  Minard Millirons 01/16/2012, 10:04 AM

## 2012-01-16 NOTE — Care Management Note (Signed)
    Page 1 of 1   01/16/2012     2:21:01 PM   CARE MANAGEMENT NOTE 01/16/2012  Patient:  Kathryn Diaz, Kathryn Diaz   Account Number:  [id number]  Date Initiated:  01/16/2012  Documentation initiated by:  GRAVES-BIGELOW,Vadis Slabach  Subjective/Objective Assessment:   Pt admited with cp. Plan for d/c after ambulation.     Action/Plan:   CM will ocntinue to monitor for disposition needs.   Anticipated DC Date:  01/16/2012   Anticipated DC Plan:  HOME/SELF CARE      DC Planning Services  CM consult      Choice offered to / List presented to:             Status of service:  Completed, signed off Medicare Important Message given?   (If response is "NO", the following Medicare IM given date fields will be blank) Date Medicare IM given:   Date Additional Medicare IM given:    Discharge Disposition:  HOME/SELF CARE  Per UR Regulation:  Reviewed for med. necessity/level of care/duration of stay  If discussed at Long Length of Stay Meetings, dates discussed:    Comments:

## 2012-01-16 NOTE — Progress Notes (Signed)
ANTICOAGULATION CONSULT NOTE - Follow Up Consult  Pharmacy Consult for heparin Indication: chest pain/ACS  Labs:  Basename 01/16/12 0216 01/16/12 0213 01/15/12 2309 01/15/12 1859 01/15/12 1807 01/15/12 1746  HGB -- 11.2* -- -- 13.3 --  HCT -- 33.1* -- -- 39.0 37.7  PLT -- 201 -- -- -- 242  APTT -- -- -- -- -- --  LABPROT -- -- -- -- -- --  INR -- -- -- -- -- --  HEPARINUNFRC -- 0.37 -- -- -- --  CREATININE 0.88 -- -- -- 1.00 0.96  CKTOTAL -- -- 110 131 -- --  CKMB -- -- 2.0 2.2 -- --  TROPONINI -- -- <0.30 <0.30 -- --    Assessment/Plan: 61yo female therapeutic on heparin with initial dosing for CP.  Will continue gtt at current rate and confirm stable with next CE.   Colleen Can PharmD BCPS 01/16/2012,3:21 AM

## 2012-01-16 NOTE — Progress Notes (Signed)
UR Completed Tavaris Eudy Graves-Bigelow, RN,BSN [phone]  

## 2012-01-16 NOTE — Progress Notes (Signed)
ANTICOAGULATION CONSULT NOTE - Follow Up Consult  Pharmacy Consult for heparin Indication: chest pain/ACS  Labs:  Basename 01/16/12 0720 01/16/12 0216 01/16/12 7564 01/15/12 2309 01/15/12 1859 01/15/12 1807 01/15/12 1746  HGB -- -- 11.2* -- -- 13.3 --  HCT -- -- 33.1* -- -- 39.0 37.7  PLT -- -- 201 -- -- -- 242  APTT -- -- -- -- -- -- --  LABPROT -- -- -- -- -- -- --  INR -- -- -- -- -- -- --  HEPARINUNFRC 0.51 -- 0.37 -- -- -- --  CREATININE -- 0.88 -- -- -- 1.00 0.96  CKTOTAL 98 -- -- 110 131 -- --  CKMB 2.0 -- -- 2.0 2.2 -- --  TROPONINI <0.30 -- -- <0.30 <0.30 -- --    Assessment: Heparin level = 0.51, therpeutic on IV heparin drip 950 units/hr for chest pain/ACS in this 61yo female. PLTC 201K, H/H 11.2/33.1.  No bleeding noted.   Goal of Therapy:  Heparin level 0.3-0.7 units/ml  Monitor platelets by anticoagulation protocol: Yes    Plan:  Continue IV heparin 950 units/hr  Daily heparin level and CBC.  Noah Delaine, RPh Clinical Pharmacist 01/16/2012,12:05 PM

## 2012-01-16 NOTE — Consult Note (Signed)
Reason for Consult: Chest pain Referring Physician:   YICEL SHANNON is an 61 y.o. female.  HPI:    The patient is a 61 year old female who is obese with a history of hypertension, hypercholesterolemia, breast cancer, hypothyroidism, borderline type 2 diabetes. She presented with chest discomfort that started about 2 days ago.  It occurred while she was at work. She states that it felt "like someone scared me".  She does not describe it as pain or pressure. It was no radiation to arm, neck, back, jaw. Does report some chest tightness that occurred while she laid flat and it resolved with sitting up.  She denies palpitations, diaphoresis, dizziness, lower extremity edema.  She has no history of tobacco use.  Initial plan of care troponin was 0.15. All subsequent cardiac enzymes have been negative.  EKG shows LAFB, poor R progression, prolonged QT rate of 96BPM.  Telemetry shows first and second degree AVB.  Rate as low as 37 BPM.  Past Medical History  Diagnosis Date  . Hypertension   . Hypercholesteremia   . Breast cancer   . Hypothyroidism   . Type II diabetes mellitus     "borderline"  . Headache     Past Surgical History  Procedure Date  . Breast reconstruction 1983  . Colonoscopy 2013  . Right ankle repair 1989  . Laparoscopy 11/11/2011    Procedure: LAPAROSCOPY OPERATIVE;  Surgeon: Miguel Aschoff, MD;  Location: [facility];  Service: Gynecology;  Laterality: N/A;  . Salpingoophorectomy 11/11/2011    Procedure: SALPINGO OOPHERECTOMY;  Surgeon: Miguel Aschoff, MD;  Location: [facility];  Service: Gynecology;  Laterality: Bilateral;  . Repair tendons foot ~ 2000    right ankle  . Tubal ligation 1974  . Mastectomy 1982    RIGHT BREAST     History reviewed. No pertinent family history.  Social History:  reports that she has never smoked. She has never used smokeless tobacco. She reports that she does not drink alcohol or use illicit drugs.  Allergies: No Known Allergies  Medications:      . allopurinol  300 mg Oral Daily  . aspirin  324 mg Oral Once  . aspirin EC  325 mg Oral Daily  . atorvastatin  40 mg Oral q1800  . docusate sodium  100 mg Oral BID  . heparin  4,000 Units Intravenous Once  . irbesartan  75 mg Oral Daily  . levothyroxine  112 mcg Oral Q0600  . nitroGLYCERIN  0.5 inch Topical Once  . nitroGLYCERIN  1 inch Topical Q6H  . pneumococcal 23 valent vaccine  0.5 mL Intramuscular Tomorrow-1000  . potassium chloride SA      . potassium chloride  40 mEq Oral Once  . sodium chloride  3 mL Intravenous Q12H  . DISCONTD: metFORMIN  500 mg Oral Q breakfast     Results for orders placed during the hospital encounter of 01/15/12 (from the past 48 hour(s))  CBC     Status: Normal   Collection Time   01/15/12  5:46 PM      Component Value Range Comment   WBC 9.1  4.0 - 10.5 K/uL    RBC 4.50  3.87 - 5.11 MIL/uL    Hemoglobin 12.4  12.0 - 15.0 g/dL    HCT 16.1  09.6 - 04.5 %    MCV 83.8  78.0 - 100.0 fL    MCH 27.6  26.0 - 34.0 pg    MCHC 32.9  30.0 - 36.0 g/dL  RDW 15.3  11.5 - 15.5 %    Platelets 242  150 - 400 K/uL   DIFFERENTIAL     Status: Normal   Collection Time   01/15/12  5:46 PM      Component Value Range Comment   Neutrophils Relative 62  43 - 77 %    Neutro Abs 5.6  1.7 - 7.7 K/uL    Lymphocytes Relative 31  12 - 46 %    Lymphs Abs 2.8  0.7 - 4.0 K/uL    Monocytes Relative 6  3 - 12 %    Monocytes Absolute 0.5  0.1 - 1.0 K/uL    Eosinophils Relative 1  0 - 5 %    Eosinophils Absolute 0.1  0.0 - 0.7 K/uL    Basophils Relative 0  0 - 1 %    Basophils Absolute 0.0  0.0 - 0.1 K/uL   BASIC METABOLIC PANEL     Status: Abnormal   Collection Time   01/15/12  5:46 PM      Component Value Range Comment   Sodium 145  135 - 145 mEq/L    Potassium 3.4 (*) 3.5 - 5.1 mEq/L    Chloride 107  96 - 112 mEq/L    CO2 27  19 - 32 mEq/L    Glucose, Bld 102 (*) 70 - 99 mg/dL    BUN 21  6 - 23 mg/dL    Creatinine, Ser 1.61  0.50 - 1.10 mg/dL    Calcium 9.7   8.4 - 10.5 mg/dL    GFR calc non Af Amer 63 (*) >90 mL/min    GFR calc Af Amer 72 (*) >90 mL/min   POCT I-STAT TROPONIN I     Status: Abnormal   Collection Time   01/15/12  6:05 PM      Component Value Range Comment   Troponin i, poc 0.15 (*) 0.00 - 0.08 ng/mL    Comment NOTIFIED PHYSICIAN      Comment 3            POCT I-STAT, CHEM 8     Status: Abnormal   Collection Time   01/15/12  6:07 PM      Component Value Range Comment   Sodium 145  135 - 145 mEq/L    Potassium 3.7  3.5 - 5.1 mEq/L    Chloride 106  96 - 112 mEq/L    BUN 23  6 - 23 mg/dL    Creatinine, Ser 0.96  0.50 - 1.10 mg/dL    Glucose, Bld 045 (*) 70 - 99 mg/dL    Calcium, Ion 4.09  8.11 - 1.32 mmol/L    TCO2 26  0 - 100 mmol/L    Hemoglobin 13.3  12.0 - 15.0 g/dL    HCT 91.4  78.2 - 95.6 %   TROPONIN I     Status: Normal   Collection Time   01/15/12  6:59 PM      Component Value Range Comment   Troponin I <0.30  <0.30 ng/mL   CK TOTAL AND CKMB     Status: Normal   Collection Time   01/15/12  6:59 PM      Component Value Range Comment   Total CK 131  7 - 177 U/L    CK, MB 2.2  0.3 - 4.0 ng/mL    Relative Index 1.7  0.0 - 2.5   PRO B NATRIURETIC PEPTIDE     Status: Abnormal   Collection  Time   01/15/12 11:09 PM      Component Value Range Comment   Pro B Natriuretic peptide (BNP) 157.9 (*) 0 - 125 pg/mL   CARDIAC PANEL(CRET KIN+CKTOT+MB+TROPI)     Status: Normal   Collection Time   01/15/12 11:09 PM      Component Value Range Comment   Total CK 110  7 - 177 U/L    CK, MB 2.0  0.3 - 4.0 ng/mL    Troponin I <0.30  <0.30 ng/mL    Relative Index 1.8  0.0 - 2.5   HEPARIN LEVEL (UNFRACTIONATED)     Status: Normal   Collection Time   01/16/12  2:13 AM      Component Value Range Comment   Heparin Unfractionated 0.37  0.30 - 0.70 IU/mL   CBC     Status: Abnormal   Collection Time   01/16/12  2:13 AM      Component Value Range Comment   WBC 8.1  4.0 - 10.5 K/uL    RBC 4.02  3.87 - 5.11 MIL/uL    Hemoglobin 11.2  (*) 12.0 - 15.0 g/dL    HCT 40.9 (*) 81.1 - 46.0 %    MCV 82.3  78.0 - 100.0 fL    MCH 27.9  26.0 - 34.0 pg    MCHC 33.8  30.0 - 36.0 g/dL    RDW 91.4  78.2 - 95.6 %    Platelets 201  150 - 400 K/uL   COMPREHENSIVE METABOLIC PANEL     Status: Abnormal   Collection Time   01/16/12  2:16 AM      Component Value Range Comment   Sodium 142  135 - 145 mEq/L    Potassium 3.4 (*) 3.5 - 5.1 mEq/L    Chloride 109  96 - 112 mEq/L    CO2 25  19 - 32 mEq/L    Glucose, Bld 106 (*) 70 - 99 mg/dL    BUN 20  6 - 23 mg/dL    Creatinine, Ser 2.13  0.50 - 1.10 mg/dL    Calcium 9.0  8.4 - 08.6 mg/dL    Total Protein 5.8 (*) 6.0 - 8.3 g/dL    Albumin 3.3 (*) 3.5 - 5.2 g/dL    AST 13  0 - 37 U/L    ALT 12  0 - 35 U/L    Alkaline Phosphatase 55  39 - 117 U/L    Total Bilirubin 0.3  0.3 - 1.2 mg/dL    GFR calc non Af Amer 69 (*) >90 mL/min    GFR calc Af Amer 81 (*) >90 mL/min   HEPARIN LEVEL (UNFRACTIONATED)     Status: Normal   Collection Time   01/16/12  7:20 AM      Component Value Range Comment   Heparin Unfractionated 0.51  0.30 - 0.70 IU/mL     No results found.  Review of Systems  Constitutional: Negative for fever and diaphoresis.  HENT: Negative for congestion, sore throat and neck pain.   Eyes: Negative for blurred vision and double vision.  Respiratory: Negative for cough and shortness of breath.   Cardiovascular: Negative for chest pain, palpitations, orthopnea, claudication, leg swelling and PND.       She does not describe chest pain but a feeling that someone scared her.  Gastrointestinal: Negative for nausea, vomiting, abdominal pain, blood in stool and melena.  Genitourinary: Negative for dysuria and hematuria.  Musculoskeletal: Negative for myalgias.  Neurological: Negative  for dizziness, weakness and headaches.   Blood pressure 124/75, pulse 59, temperature 98.3 F (36.8 C), temperature source Oral, resp. rate 20, height 5\' 5"  (1.651 m), weight 99.8 kg (220 lb 0.3 oz), SpO2  99.00%. Physical Exam  Constitutional: She is oriented to person, place, and time. She appears well-developed. No distress.       Obese  HENT:  Head: Normocephalic and atraumatic.  Eyes: EOM are normal. Pupils are equal, round, and reactive to light. No scleral icterus.  Neck: Normal range of motion. Neck supple. No JVD present.  Cardiovascular: Normal rate and regular rhythm.   No murmur heard. Pulses:      Radial pulses are 2+ on the right side, and 2+ on the left side.       Dorsalis pedis pulses are 2+ on the right side, and 2+ on the left side.       Trace LEE No carotid bruits.  Respiratory: Effort normal and breath sounds normal. She has no wheezes. She has no rales. She exhibits no tenderness.  GI: Soft. Bowel sounds are normal. There is no tenderness.  Musculoskeletal: She exhibits edema.       Trace of LEE   Lymphadenopathy:    She has no cervical adenopathy.  Neurological: She is alert and oriented to person, place, and time. She exhibits normal muscle tone.  Skin: Skin is warm and dry.  Psychiatric: She has a normal mood and affect.    Assessment/Plan: Patient Active Hospital Problem List: Second degree AVB LAFB Chest pain (01/15/2012) Diabetes mellitus (01/15/2012) Hypertension (01/15/2012) Hyperlipidemia (01/15/2012) Hypothyroidism (01/15/2012)  Plan:  Echo pending.  Except for initial POC troponin cardiac markers are negative.  No heart rate lowering meds.   Will check lipid panel this AM.  She can probably go with OP nuc and 30 day cardiac monitor.  We will wait for results of Echo.    Wilburt Finlay 01/16/2012, 8:49 AM      Agree with note written by Jones Skene PAC  61 y/o female with + CRF, atypical CP for last 2-3 days. Pain was very brief and improved with deep breathing. Exam benign. EKG w/o acute changes. Enz neg (1 POCM borderline +). 2D echo pending. If nl, will D/C home today and arrange OP exercise myoview as well as event monitor for brady on tele  on Tuesday. Discussed with pt, sister and daughter.    Runell Gess 01/16/2012 12:45 PM

## 2012-01-16 NOTE — Clinical Social Work Psychosocial (Signed)
     Clinical Social Work Department BRIEF PSYCHOSOCIAL ASSESSMENT 01/16/2012  Patient:  Kathryn Diaz, Kathryn Diaz     Account Number:  [id number]     Admit date:  01/15/2012  Clinical Social Worker:  Doree Albee  Date/Time:  01/16/2012 04:00 PM  Referred by:  RN  Date Referred:  01/16/2012 Referred for  Advanced Directives  Advanced Directives   Other Referral:   Interview type:  Patient Other interview type:    PSYCHOSOCIAL DATA Living Status:   Admitted from facility:   Level of care:   Primary support name:  Ginger Harrison Primary support relationship to patient:  CHILD, ADULT Degree of support available:   strong    CURRENT CONCERNS Current Concerns  Other - See comment   Other Concerns:   advanced directives    SOCIAL WORK ASSESSMENT / PLAN CSW met with pt and pt family at bedside to discuss advanced directives and role of health care power of attorney and living willl. Pt verbally expressed undrestanding of directions to complete advanced directives. Pt requested time to look over adavanced directive and would most likely take home.    Pt stated that she will notify staff if she would like to complete the advanced directive.   Assessment/plan status:  No Further Intervention Required Other assessment/ plan:   Information/referral to community resources:   advanced directive packet    PATIENTS/FAMILYS RESPONSE TO PLAN OF CARE: Pt and pt family thanked csw for concern and support. Pt thanked csw for education regarding advanced directive and health care power of attorney.

## 2012-01-18 NOTE — Discharge Summary (Signed)
Physician Discharge Summary  Patient ID: Kathryn Diaz MRN: [id number] DOB/AGE: Sep 23, 1950 61 y.o.  Admit date: 01/15/2012 Discharge date: 01/16/2012  Discharge Diagnoses:  Principal Problem:  *Chest pain, negative MI, normal Echo, outpt. Nuc. stress test Active Problems:  Diabetes mellitus  Hypertension  Hyperlipidemia  Hypothyroidism  Second degree AV block, Mobitz type I  Bradycardia   Discharged Condition: good  Hospital Course:  61 year old female who is obese with a history of hypertension, hypercholesterolemia, breast cancer, hypothyroidism, borderline type 2 diabetes. She presented with chest discomfort that started about 2 days ago. It occurred while she was at work. She states that it felt "like someone scared me". She does not describe it as pain or pressure. It was no radiation to arm, neck, back, jaw. Does report some chest tightness that occurred while she laid flat and it resolved with sitting up. She denies palpitations, diaphoresis, dizziness, lower extremity edema. She has no history of tobacco use. Initial plan of care troponin was 0.15. All subsequent cardiac enzymes have been negative. EKG shows LAFB, poor R progression, prolonged QT rate of 96BPM. Telemetry shows first and second degree AVB. Rate as low as 37 BPM.  She was admitted overnight without further pain/discomfort.  She was seen by Dr. Allyson Sabal in consult.    2D Echo: Left ventricle: The cavity size was normal. There was moderate concentric hypertrophy. Systolic function was normal. The estimated ejection fraction was in the range of 60% to 65%. Wall motion was normal; there were no regional wall motion abnormalities. Doppler parameters are consistent with abnormal left ventricular relaxation (grade 1 diastolic dysfunction). The E/e' ratio is >10, suggesting elevated LV filling pressure. - Aortic valve: Mildly thickened leaflets. There was no stenosis. No regurgitation. - Mitral valve: Mildly  thickened leaflets . Late bileaflet systolic prolapse. Mild central regurgitation. - Left atrium: The atrium was at the upper limits of normal in size. - Right atrium: The atrium was mildly dilated. - Tricuspid valve: Mild regurgitation. - Inferior vena cava: The vessel was normal in size; the respirophasic diameter changes were in the normal range (= 50%); findings are consistent with normal central venous pressure. - Pericardium, extracardiac: There was no pericardial effusion.  No further bradycardia.  Pt was ambulated without out any complaints.  Dr. Allyson Sabal discharged with plans for [facility] and Event monitor for the bradycardia.   These tests have been arranged in the office.  Consults: cardiology  Significant Diagnostic Studies: Initial troponin marker 0.15 followup troponins < 0.30x4 with CK 131-98 in the 2.2-2.0. Negative myocardial infarction.  Sodium 142 potassium 3.4 and this was replaced chloride 109 CO2 25 BUN 20 creatinine 0.88 calcium 9.0 glucose 106 alkaline phosphatase albumin 3.3 AST 13 ALT 12 total protein 5.8 total bili 0.3  Total cholesterol 136 triglycerides 53 HDL 50 LDL 75   Hemoglobin 11.2 hematocrit 33.1 PVC 8.1 platelets 201.  TSH 0.30  EKG sinus rhythm without acute changes.  Discharge Exam: Blood pressure 134/79, pulse 64, temperature 98.3 F (36.8 C), temperature source Oral, resp. rate 20, height 5\' 5"  (1.651 m), weight 99.8 kg (220 lb 0.3 oz), SpO2 98.00%.   Constitutional: She is oriented to person, place, and time. She appears well-developed. No distress.  Obese  HENT:  Head: Normocephalic and atraumatic.  Eyes: EOM are normal. Pupils are equal, round, and reactive to light. No scleral icterus.  Neck: Normal range of motion. Neck supple. No JVD present.  Cardiovascular: Normal rate and regular rhythm.  No murmur heard.  Pulses:  Radial pulses are 2+ on the right side, and 2+ on the left side.  Dorsalis pedis pulses are 2+ on the right  side, and 2+ on the left side.  Trace LEE No carotid bruits.  Respiratory: Effort normal and breath sounds normal. She has no wheezes. She has no rales. She exhibits no tenderness.  GI: Soft. Bowel sounds are normal. There is no tenderness.  Musculoskeletal: She exhibits edema.  Trace of LEE    Disposition: 01-Home or Self Care   Medication List  As of 01/18/2012  5:03 PM   TAKE these medications         allopurinol 300 MG tablet   Commonly known as: ZYLOPRIM   Take 300 mg by mouth daily.      aspirin 325 MG EC tablet   Take 1 tablet (325 mg total) by mouth daily.      GLUCOPHAGE XR 500 MG 24 hr tablet   Generic drug: metFORMIN   Take 500 mg by mouth daily with breakfast.      levothyroxine 112 MCG tablet   Commonly known as: SYNTHROID, LEVOTHROID   Take 112 mcg by mouth daily.      simvastatin 80 MG tablet   Commonly known as: ZOCOR   Take 80 mg by mouth daily.      valsartan 80 MG tablet   Commonly known as: DIOVAN   Take 80 mg by mouth daily.           Follow-up Information    Follow up with Runell Gess, MD on 01/20/2012. (for Stress test, lexiscan myoview at Dr. Hazle Coca office.  Do not  eat of Drink after  5:00 am the day of the test. Do not wear strong deodorant or perfume to the test.  no caffine 24 hours prior to the test.  Be at the office at 1:00 pm )    Contact information:   [address] [place] [address] [phone]       Follow up with Runell Gess, MD on 01/20/2012. (you will have a monitor placed also on that day)    Contact information:   [address] [place] [address] [phone]       Follow up with Runell Gess, MD. (Our office will call and schedule you with a follow up with Dr. Allyson Sabal to go over test results.)    Contact information:   [address] [place] [address] [phone]        Discharge instructions: Heart healthy  diabetic diet.  Call or come to the ER for chest pain, lightheadedness or dizziness.   SignedLeone Brand 01/18/2012, 5:03 PM

## 2013-01-10 ENCOUNTER — Other Ambulatory Visit: Payer: Self-pay | Admitting: Gynecology

## 2013-03-24 ENCOUNTER — Encounter: Payer: Self-pay | Admitting: *Deleted

## 2013-03-29 ENCOUNTER — Encounter: Payer: Self-pay | Admitting: Cardiovascular Disease

## 2013-03-30 ENCOUNTER — Encounter: Payer: Self-pay | Admitting: Cardiovascular Disease

## 2013-03-30 ENCOUNTER — Ambulatory Visit (INDEPENDENT_AMBULATORY_CARE_PROVIDER_SITE_OTHER): Payer: Federal, State, Local not specified - PPO | Admitting: Cardiovascular Disease

## 2013-03-30 VITALS — BP 118/72 | HR 67 | Ht 65.0 in | Wt 229.6 lb

## 2013-03-30 DIAGNOSIS — R079 Chest pain, unspecified: Secondary | ICD-10-CM

## 2013-03-30 DIAGNOSIS — E785 Hyperlipidemia, unspecified: Secondary | ICD-10-CM

## 2013-03-30 DIAGNOSIS — I10 Essential (primary) hypertension: Secondary | ICD-10-CM

## 2013-03-30 NOTE — Assessment & Plan Note (Signed)
Well-controlled on current medications 

## 2013-03-30 NOTE — Assessment & Plan Note (Signed)
History of normal Myoview stress test 01/23/12. She does have positive risk factors. She's had no recurrent chest pain.

## 2013-03-30 NOTE — Assessment & Plan Note (Signed)
On statin therapy followed by her PCP 

## 2013-03-30 NOTE — Patient Instructions (Signed)
Your physician wants you to follow-up in: 1 year with Dr Berry. You will receive a reminder letter in the mail two months in advance. If you don't receive a letter, please call our office to schedule the follow-up appointment.  

## 2013-03-30 NOTE — Progress Notes (Signed)
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 She was seen at [facility] January 15, 2012 through January 16, 2012, with atypical chest pain like "somebody scared her." She ruled out for MI by troponins. She did have some second-degree AV block Mobitz type I which she also had on event monitoring as an outpatient. A 2D echo performed in the hospital was normal. A Myoview stress test performed in our office on January 23, 2012, was normal as well. She has had no recurrent symptoms. Her risk factors include hypertension, hyperlipidemia and diabetes, as well as a family history with a half-brother that had an MI.   Since I saw her a year ago she has had no recurrent symptoms.      Current Outpatient Prescriptions  Medication Sig Dispense Refill  . allopurinol (ZYLOPRIM) 300 MG tablet Take 300 mg by mouth daily.      Marland Kitchen levothyroxine (SYNTHROID, LEVOTHROID) 112 MCG tablet Take 112 mcg by mouth daily.      . metFORMIN (GLUCOPHAGE XR) 500 MG 24 hr tablet Take 500 mg by mouth daily with breakfast. Takes occasionally.      . simvastatin (ZOCOR) 80 MG tablet Take 80 mg by mouth daily.      . valsartan (DIOVAN) 80 MG tablet Take 80 mg by mouth daily.       No current facility-administered medications for this visit.    No Known Allergies  History   Social History  . Marital Status: Divorced    Spouse Name: N/A    Number of Children: 5  . Years of Education: N/A   Occupational History  . Scientist, water quality at [employer]    Social History Main Topics  . Smoking status: Never Smoker   . Smokeless tobacco: Never Used  . Alcohol Use: No  . Drug Use: No  . Sexual Activity: No   Other Topics Concern    . Not on file   Social History Narrative  . No narrative on file     Review of Systems: General: negative for chills, fever, night sweats or weight changes.  Cardiovascular: negative for chest pain, dyspnea on exertion, edema, orthopnea, palpitations, paroxysmal nocturnal dyspnea or shortness of breath Dermatological: negative for rash Respiratory: negative for cough or wheezing Urologic: negative for hematuria Abdominal: negative for nausea, vomiting, diarrhea, bright red blood per rectum, melena, or hematemesis Neurologic: negative for visual changes, syncope, or dizziness All other systems reviewed and are otherwise negative except as noted above.    Blood pressure 118/72, pulse 67, height 5\' 5"  (1.651 m), weight 229 lb 9.6 oz (104.146 kg).  General appearance: alert and no distress Neck: no adenopathy, no carotid bruit, no JVD, supple, symmetrical, trachea midline and thyroid not enlarged, symmetric, no tenderness/mass/nodules Lungs: clear to auscultation bilaterally Heart: regular rate and rhythm, S1, S2 normal, no murmur, click, rub or gallop Extremities: extremities normal, atraumatic, no cyanosis or edema  EKG normal sinus rhythm at 57 without ST or T wave changes.  ASSESSMENT AND PLAN:   Chest pain, negative MI, normal Echo, outpt. Nuc. stress test History of normal Myoview stress test 01/23/12. She does have positive risk factors. She's had no recurrent chest pain.  Hyperlipidemia On statin therapy followed by  her PCP  Hypertension Well-controlled on current medications      Runell Gess MD [facility], [facility] 03/30/2013 4:12 PM

## 2014-04-04 ENCOUNTER — Ambulatory Visit (INDEPENDENT_AMBULATORY_CARE_PROVIDER_SITE_OTHER): Payer: Federal, State, Local not specified - PPO | Admitting: Cardiovascular Disease

## 2014-04-04 ENCOUNTER — Encounter: Payer: Self-pay | Admitting: Cardiovascular Disease

## 2014-04-04 VITALS — BP 144/90 | HR 71 | Ht 65.0 in | Wt 236.7 lb

## 2014-04-04 DIAGNOSIS — E785 Hyperlipidemia, unspecified: Secondary | ICD-10-CM

## 2014-04-04 DIAGNOSIS — I10 Essential (primary) hypertension: Secondary | ICD-10-CM

## 2014-04-04 NOTE — Assessment & Plan Note (Signed)
On statin therapy followed by her PCP 

## 2014-04-04 NOTE — Progress Notes (Signed)
     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 She did have some second-degree AV block Mobitz type I which she also had on event monitoring as an outpatient. A 2D echo performed in the hospital was normal. A Myoview stress test performed in our office on January 23, 2012, was normal as well. She has had no recurrent symptoms. Her risk factors include hypertension, hyperlipidemia and diabetes, as well as a family history with a half-brother that had an MI.  Since I saw her a year ago she has had no recurrent symptoms.    Current Outpatient Prescriptions  Medication Sig Dispense Refill  . allopurinol (ZYLOPRIM) 300 MG tablet Take 300 mg by mouth daily.      Marland Kitchen levothyroxine (SYNTHROID, LEVOTHROID) 112 MCG tablet Take 112 mcg by mouth daily.      . metFORMIN (GLUCOPHAGE XR) 500 MG 24 hr tablet Take 500 mg by mouth daily with breakfast.       . simvastatin (ZOCOR) 80 MG tablet Take 80 mg by mouth daily.      . valsartan (DIOVAN) 80 MG tablet Take 80 mg by mouth daily.       No current facility-administered medications for this visit.    No Known Allergies  History   Social History  . Marital Status: Divorced    Spouse Name: N/A    Number of Children: 33  . Years of Education: N/A   Occupational History  . Engineer, water at [employer]  . Smoking status: Never Smoker   . Smokeless tobacco: Never Used  . Alcohol Use: No  . Drug Use: No  . Sexual Activity: No   Other Topics Concern  . Not on file    Social History Narrative  . No narrative on file     Review of Systems: General: negative for chills, fever, night sweats or weight changes.  Cardiovascular: negative for chest pain, dyspnea on exertion, edema, orthopnea, palpitations, paroxysmal nocturnal dyspnea or shortness of breath Dermatological: negative for rash Respiratory: negative for cough or wheezing Urologic: negative for hematuria Abdominal: negative for nausea, vomiting, diarrhea, bright red blood per rectum, melena, or hematemesis Neurologic: negative for visual changes, syncope, or dizziness All other systems reviewed and are otherwise negative except as noted above.    Blood pressure 144/90, pulse 71, height 5\' 5"  (1.651 m), weight 236 lb 11.2 oz (107.366 kg).  General appearance: alert and no distress Neck: no adenopathy, no carotid bruit, no JVD, supple, symmetrical, trachea midline and thyroid not enlarged, symmetric, no tenderness/mass/nodules Lungs: clear to auscultation bilaterally Heart: regular rate and rhythm, S1, S2 normal, no murmur, click, rub or gallop Extremities: extremities normal, atraumatic, no cyanosis or edema  EKG normal sinus rhythm at 71 with no ST or T wave changes  ASSESSMENT AND PLAN:   Hypertension Controlled on current medications  Hyperlipidemia On statin therapy followed by her PCP      Lorretta Harp MD [facility], [facility] 04/04/2014 8:21 AM

## 2014-04-04 NOTE — Assessment & Plan Note (Signed)
Controlled on current medications 

## 2014-04-04 NOTE — Patient Instructions (Signed)
Dr Gwenlyn Found wants you to follow-up in 1 year. You will receive a reminder letter in the mail one months in advance. If you don't receive a letter, please call our office to schedule the follow-up appointment.

## 2014-04-10 ENCOUNTER — Encounter: Payer: Self-pay | Admitting: Cardiovascular Disease

## 2014-04-17 ENCOUNTER — Encounter: Payer: Self-pay | Admitting: Cardiovascular Disease

## 2015-04-04 ENCOUNTER — Ambulatory Visit: Payer: Federal, State, Local not specified - PPO | Admitting: Cardiovascular Disease

## 2015-04-17 ENCOUNTER — Other Ambulatory Visit: Payer: Self-pay | Admitting: Radiology

## 2015-05-30 ENCOUNTER — Other Ambulatory Visit: Payer: Self-pay | Admitting: Internal Medicine

## 2015-05-30 DIAGNOSIS — M25511 Pain in right shoulder: Secondary | ICD-10-CM

## 2015-06-05 ENCOUNTER — Ambulatory Visit
Admission: RE | Admit: 2015-06-05 | Discharge: 2015-06-05 | Disposition: A | Discharge status: Home / Self Care | Payer: Federal, State, Local not specified - PPO | Source: Ambulatory Visit | Attending: Internal Medicine | Admitting: Internal Medicine

## 2015-06-05 DIAGNOSIS — M25511 Pain in right shoulder: Secondary | ICD-10-CM

## 2016-07-01 ENCOUNTER — Encounter: Payer: Self-pay | Admitting: Cardiovascular Disease

## 2016-07-01 ENCOUNTER — Ambulatory Visit (INDEPENDENT_AMBULATORY_CARE_PROVIDER_SITE_OTHER): Payer: Federal, State, Local not specified - PPO | Admitting: Cardiovascular Disease

## 2016-07-01 VITALS — BP 98/70 | HR 78 | Ht 65.0 in | Wt 244.0 lb

## 2016-07-01 DIAGNOSIS — R079 Chest pain, unspecified: Secondary | ICD-10-CM

## 2016-07-01 DIAGNOSIS — I10 Essential (primary) hypertension: Secondary | ICD-10-CM | POA: Diagnosis not present

## 2016-07-01 DIAGNOSIS — E7800 Pure hypercholesterolemia, unspecified: Secondary | ICD-10-CM | POA: Diagnosis not present

## 2016-07-01 MED ORDER — PANTOPRAZOLE SODIUM 40 MG PO TBEC: 40.0000 mg | DELAYED_RELEASE_TABLET | Freq: Every day | ORAL | 1 refills | Status: DC

## 2016-07-01 NOTE — Assessment & Plan Note (Signed)
History of atypical chest pain with negative Myoview 01/15/12. Over the last several months she's had recurrent substernal burning lasting for seconds to minutes at a time unrelated to activity. I'm going to begin her on Protonix 40 mg a day for a two-month empiric trial and we'll see her back after that. If her symptoms do not improve we will consider functional testing.

## 2016-07-01 NOTE — Progress Notes (Signed)
07/01/2016 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 She was seen at [facility] January 15, 2012 through January 16, 2012, with atypical chest pain like "somebody scared her." She ruled out for MI by troponins. She did have some second-degree AV block Mobitz type I which she also had on event monitoring as an outpatient. A 2D echo performed in the hospital was normal. A Myoview stress test performed in our office on January 23, 2012, was normal as well. She has had no recurrent symptoms. Her risk factors include hypertension, hyperlipidemia and diabetes, as well as a family history with a half-brother that had an MI.  Since I saw her a year ago she has has developed atypical substernal chest burning occurring on a weekly basis associated with activity, eating or position..   Current Outpatient Prescriptions  Medication Sig Dispense Refill  . allopurinol (ZYLOPRIM) 300 MG tablet Take 300 mg by mouth daily.    . carvedilol (COREG) 3.125 MG tablet Take 3.125 mg by mouth 2 (two) times daily with a meal.    . levothyroxine (SYNTHROID, LEVOTHROID) 112 MCG tablet Take 112 mcg by mouth daily.    . metFORMIN (GLUCOPHAGE XR) 500 MG 24 hr tablet Take 500 mg by mouth daily with breakfast.     . simvastatin (ZOCOR) 80 MG tablet Take 80 mg by mouth daily.    Marland Kitchen triamterene-hydrochlorothiazide (DYAZIDE) 37.5-25 MG capsule Take 1 capsule by mouth daily.    . valsartan (DIOVAN) 320 MG tablet Take 320 mg by mouth daily.    . pantoprazole (PROTONIX) 40 MG tablet Take 1 tablet (40 mg total) by mouth daily. 90 tablet 1   No current facility-administered medications for this visit.     No  Known Allergies  Social History   Social History  . Marital status: Divorced    Spouse name: N/A  . Number of children: 5  . Years of education: N/A   Occupational History  . Engineer, water at [employer]    Social History Main Topics  . Smoking status: Never Smoker  . Smokeless tobacco: Never Used  . Alcohol use No  . Drug use: No  . Sexual activity: No   Other Topics Concern  . Not on file   Social History Narrative  . No narrative on file     Review of Systems: General: negative for chills, fever, night sweats or weight changes.  Cardiovascular: negative for chest pain, dyspnea on exertion, edema, orthopnea, palpitations, paroxysmal nocturnal dyspnea or shortness of breath Dermatological: negative for rash Respiratory: negative for cough or wheezing Urologic: negative for hematuria Abdominal: negative for nausea, vomiting, diarrhea, bright red blood per rectum, melena, or hematemesis Neurologic: negative for visual changes, syncope, or dizziness All other systems reviewed and are otherwise negative except as noted above.    Blood pressure 98/70, pulse 78, height 5\' 5"  (1.651 m), weight 244 lb (110.7 kg).  General appearance: alert and no distress Neck: no adenopathy, no carotid bruit, no JVD, supple, symmetrical, trachea midline and thyroid not enlarged, symmetric, no tenderness/mass/nodules Lungs: clear to auscultation bilaterally Heart: regular rate and rhythm, S1, S2 normal, no murmur, click, rub  or gallop Extremities: extremities normal, atraumatic, no cyanosis or edema  EKG sinus rhythm at 78 with left anterior fascicular block. I personally reviewed this EKG  ASSESSMENT AND PLAN:   Chest pain, negative MI, normal Echo, outpt. Nuc. stress test History of atypical chest pain with negative Myoview 01/15/12. Over the last several months she's had recurrent substernal burning lasting for seconds to minutes at a time unrelated to activity. I'm going  to begin her on Protonix 40 mg a day for a two-month empiric trial and we'll see her back after that. If her symptoms do not improve we will consider functional testing.  Hypertension History of hypertension blood pressure measures 98/70. She is on Dyazide and valsartan. Continue current meds are Cardizem  Hyperlipidemia History of hyperlipidemia on statin therapy followed by her PCP      Lorretta Harp MD [facility], [facility] 07/01/2016 8:21 AM

## 2016-07-01 NOTE — Assessment & Plan Note (Signed)
History of hyperlipidemia on statin therapy followed by her PCP. 

## 2016-07-01 NOTE — Patient Instructions (Signed)
Medication Instructions: START Protonix 40 mg daily   Follow-Up: Your physician recommends that you schedule a follow-up appointment in: 2 months with Dr. Gwenlyn Found.  If you need a refill on your cardiac medications before your next appointment, please call your pharmacy.

## 2016-07-01 NOTE — Assessment & Plan Note (Signed)
History of hypertension blood pressure measures 98/70. She is on Dyazide and valsartan. Continue current meds are Cardizem

## 2016-09-02 ENCOUNTER — Ambulatory Visit: Payer: Federal, State, Local not specified - PPO | Admitting: Cardiovascular Disease

## 2016-09-17 ENCOUNTER — Ambulatory Visit: Payer: Federal, State, Local not specified - PPO | Admitting: Cardiovascular Disease

## 2016-09-19 ENCOUNTER — Encounter: Payer: Self-pay | Admitting: Cardiovascular Disease

## 2016-09-19 ENCOUNTER — Ambulatory Visit (INDEPENDENT_AMBULATORY_CARE_PROVIDER_SITE_OTHER): Payer: Federal, State, Local not specified - PPO | Admitting: Cardiovascular Disease

## 2016-09-19 DIAGNOSIS — I208 Other forms of angina pectoris: Secondary | ICD-10-CM

## 2016-09-19 DIAGNOSIS — E7800 Pure hypercholesterolemia, unspecified: Secondary | ICD-10-CM | POA: Diagnosis not present

## 2016-09-19 DIAGNOSIS — I10 Essential (primary) hypertension: Secondary | ICD-10-CM

## 2016-09-19 NOTE — Assessment & Plan Note (Signed)
History of hypertension blood pressure measured at 106/76. She is on carvedilol and Diovan as well as Dyazide. Continue current meds at current dosing

## 2016-09-19 NOTE — Patient Instructions (Signed)

## 2016-09-19 NOTE — Progress Notes (Signed)
09/19/2016 Kathryn Diaz   11-18-1950  [id number]  Primary Physician Kathryn Lopes, MD Primary Cardiologist: Kathryn Harp MD Kathryn Diaz  HPI:   The patient is a 66 year old, moderately overweight, divorced African American female, mother of 23, grandmother to 5 grandchildren who works at [employer] as an Engineer, water. I last saw her in the office 07/01/16. She was seen at [facility] January 15, 2012 through January 16, 2012, with atypical chest pain like "somebody scared her." She ruled out for MI by troponins. She did have some second-degree AV block Mobitz type I which she also had on event monitoring as an outpatient. A 2D echo performed in the hospital was normal. A Myoview stress test performed in our office on January 23, 2012, was normal as well. She has had no recurrent symptoms. Her risk factors include hypertension, hyperlipidemia and diabetes, as well as a family history with a half-brother that had an MI.  Since I saw her in December I started Protonix and her symptoms have markedly improved.    Current Outpatient Prescriptions  Medication Sig Dispense Refill  . allopurinol (ZYLOPRIM) 300 MG tablet Take 300 mg by mouth daily.    . carvedilol (COREG) 3.125 MG tablet Take 3.125 mg by mouth 2 (two) times daily with a meal.    . levothyroxine (SYNTHROID, LEVOTHROID) 112 MCG tablet Take 112 mcg by mouth daily.    . metFORMIN (GLUCOPHAGE XR) 500 MG 24 hr tablet Take 500 mg by mouth daily with breakfast.     . pantoprazole (PROTONIX) 40 MG tablet Take 1 tablet (40 mg total) by mouth daily. 90 tablet 1  . triamterene-hydrochlorothiazide (DYAZIDE) 37.5-25 MG capsule Take 1 capsule by mouth daily.    . valsartan (DIOVAN) 320 MG tablet Take 320 mg by mouth daily.     No current facility-administered medications for this visit.     No Known Allergies  Social History   Social History  . Marital status: Divorced    Spouse name: N/A  . Number of children: 5  .  Years of education: N/A   Occupational History  . Engineer, water at [employer]    Social History Main Topics  . Smoking status: Never Smoker  . Smokeless tobacco: Never Used  . Alcohol use No  . Drug use: No  . Sexual activity: No   Other Topics Concern  . Not on file   Social History Narrative  . No narrative on file     Review of Systems: General: negative for chills, fever, night sweats or weight changes.  Cardiovascular: negative for chest pain, dyspnea on exertion, edema, orthopnea, palpitations, paroxysmal nocturnal dyspnea or shortness of breath Dermatological: negative for rash Respiratory: negative for cough or wheezing Urologic: negative for hematuria Abdominal: negative for nausea, vomiting, diarrhea, bright red blood per rectum, melena, or hematemesis Neurologic: negative for visual changes, syncope, or dizziness All other systems reviewed and are otherwise negative except as noted above.    Blood pressure 106/76, pulse 78, height 5\' 5"  (1.651 m), weight 242 lb (109.8 kg), SpO2 98 %.  General appearance: alert and no distress Neck: no adenopathy, no carotid bruit, no JVD, supple, symmetrical, trachea midline and thyroid not enlarged, symmetric, no tenderness/mass/nodules Lungs: clear to auscultation bilaterally Heart: regular rate and rhythm, S1, S2 normal, no murmur, click, rub or gallop Extremities: extremities normal, atraumatic, no cyanosis or edema  EKG not performed today  ASSESSMENT AND PLAN:  Chest pain, negative MI, normal Echo, outpt. Nuc. stress test History of atypical chest pain improved after the addition of Protonix I suspect this was gastrointestinal related to reflux.  Hypertension History of hypertension blood pressure measured at 106/76. She is on carvedilol and Diovan as well as Dyazide. Continue current meds at current dosing  Hyperlipidemia History of hyperlipidemia not on statin therapy followed by her  PCP      Kathryn Harp MD [facility], [facility] 09/19/2016 9:42 AM

## 2016-09-19 NOTE — Assessment & Plan Note (Signed)
History of hyperlipidemia not on statin therapy followed by her PCP 

## 2016-09-19 NOTE — Assessment & Plan Note (Signed)
History of atypical chest pain improved after the addition of Protonix I suspect this was gastrointestinal related to reflux.

## 2016-12-25 LAB — HM COLONOSCOPY

## 2017-01-21 ENCOUNTER — Other Ambulatory Visit: Payer: Self-pay | Admitting: *Deleted

## 2017-01-21 MED ORDER — PANTOPRAZOLE SODIUM 40 MG PO TBEC: 40.0000 mg | DELAYED_RELEASE_TABLET | Freq: Every day | ORAL | 1 refills | Status: DC

## 2017-09-07 ENCOUNTER — Other Ambulatory Visit: Payer: Self-pay | Admitting: Internal Medicine

## 2017-09-07 DIAGNOSIS — E038 Other specified hypothyroidism: Secondary | ICD-10-CM | POA: Diagnosis not present

## 2017-09-07 DIAGNOSIS — R1013 Epigastric pain: Secondary | ICD-10-CM

## 2017-09-07 DIAGNOSIS — Z6839 Body mass index (BMI) 39.0-39.9, adult: Secondary | ICD-10-CM | POA: Diagnosis not present

## 2017-09-07 DIAGNOSIS — R51 Headache: Secondary | ICD-10-CM | POA: Diagnosis not present

## 2017-09-07 DIAGNOSIS — Z1389 Encounter for screening for other disorder: Secondary | ICD-10-CM | POA: Diagnosis not present

## 2017-09-08 ENCOUNTER — Other Ambulatory Visit: Payer: Self-pay | Admitting: Internal Medicine

## 2017-09-08 DIAGNOSIS — R51 Headache: Principal | ICD-10-CM

## 2017-09-08 DIAGNOSIS — R519 Headache, unspecified: Secondary | ICD-10-CM

## 2017-09-10 ENCOUNTER — Ambulatory Visit
Admission: RE | Admit: 2017-09-10 | Discharge: 2017-09-10 | Disposition: A | Discharge status: Home / Self Care | Payer: Federal, State, Local not specified - PPO | Source: Ambulatory Visit | Attending: Internal Medicine | Admitting: Internal Medicine

## 2017-09-10 DIAGNOSIS — R1013 Epigastric pain: Secondary | ICD-10-CM

## 2017-09-10 DIAGNOSIS — K7689 Other specified diseases of liver: Secondary | ICD-10-CM | POA: Diagnosis not present

## 2017-09-14 ENCOUNTER — Ambulatory Visit
Admission: RE | Admit: 2017-09-14 | Discharge: 2017-09-14 | Disposition: A | Discharge status: Home / Self Care | Payer: Federal, State, Local not specified - PPO | Source: Ambulatory Visit | Attending: Internal Medicine | Admitting: Internal Medicine

## 2017-09-14 DIAGNOSIS — R51 Headache: Principal | ICD-10-CM

## 2017-09-14 DIAGNOSIS — R519 Headache, unspecified: Secondary | ICD-10-CM

## 2017-09-22 ENCOUNTER — Other Ambulatory Visit: Payer: Self-pay | Admitting: Internal Medicine

## 2017-09-22 DIAGNOSIS — R1013 Epigastric pain: Secondary | ICD-10-CM

## 2017-09-23 ENCOUNTER — Ambulatory Visit
Admission: RE | Admit: 2017-09-23 | Discharge: 2017-09-23 | Disposition: A | Discharge status: Home / Self Care | Payer: Federal, State, Local not specified - PPO | Source: Ambulatory Visit | Attending: Internal Medicine | Admitting: Internal Medicine

## 2017-09-23 DIAGNOSIS — R1013 Epigastric pain: Secondary | ICD-10-CM

## 2017-09-23 DIAGNOSIS — K449 Diaphragmatic hernia without obstruction or gangrene: Secondary | ICD-10-CM | POA: Diagnosis not present

## 2017-09-23 MED ORDER — IOPAMIDOL (ISOVUE-300) INJECTION 61%
100.0000 mL | Freq: Once | INTRAVENOUS | Status: AC | PRN
  Administered 2017-09-23: 100 mL via INTRAVENOUS

## 2017-09-30 ENCOUNTER — Encounter: Payer: Self-pay | Admitting: Gastroenterology

## 2017-11-09 ENCOUNTER — Ambulatory Visit: Payer: Medicare Other | Admitting: Gastroenterology

## 2017-12-01 ENCOUNTER — Other Ambulatory Visit: Payer: Self-pay

## 2017-12-09 ENCOUNTER — Telehealth: Payer: Self-pay | Admitting: *Deleted

## 2017-12-09 NOTE — Telephone Encounter (Signed)
PA complete for pantoprazole and approved through cover my meds

## 2017-12-26 HISTORY — PX: COLONOSCOPY: SHX174

## 2017-12-28 DIAGNOSIS — M109 Gout, unspecified: Secondary | ICD-10-CM | POA: Insufficient documentation

## 2017-12-28 DIAGNOSIS — E669 Obesity, unspecified: Secondary | ICD-10-CM | POA: Insufficient documentation

## 2017-12-28 DIAGNOSIS — Z860101 Personal history of adenomatous and serrated colon polyps: Secondary | ICD-10-CM | POA: Insufficient documentation

## 2017-12-28 DIAGNOSIS — Z853 Personal history of malignant neoplasm of breast: Secondary | ICD-10-CM | POA: Insufficient documentation

## 2017-12-28 DIAGNOSIS — R109 Unspecified abdominal pain: Secondary | ICD-10-CM | POA: Diagnosis not present

## 2018-02-01 DIAGNOSIS — Z6838 Body mass index (BMI) 38.0-38.9, adult: Secondary | ICD-10-CM | POA: Diagnosis not present

## 2018-02-01 DIAGNOSIS — S4992XA Unspecified injury of left shoulder and upper arm, initial encounter: Secondary | ICD-10-CM | POA: Diagnosis not present

## 2018-02-01 DIAGNOSIS — R42 Dizziness and giddiness: Secondary | ICD-10-CM | POA: Diagnosis not present

## 2018-02-01 DIAGNOSIS — I10 Essential (primary) hypertension: Secondary | ICD-10-CM | POA: Diagnosis not present

## 2018-03-25 DIAGNOSIS — M25559 Pain in unspecified hip: Secondary | ICD-10-CM | POA: Diagnosis not present

## 2018-03-25 DIAGNOSIS — I10 Essential (primary) hypertension: Secondary | ICD-10-CM | POA: Diagnosis not present

## 2018-03-25 DIAGNOSIS — Z23 Encounter for immunization: Secondary | ICD-10-CM | POA: Diagnosis not present

## 2018-03-25 DIAGNOSIS — Z6839 Body mass index (BMI) 39.0-39.9, adult: Secondary | ICD-10-CM | POA: Diagnosis not present

## 2018-04-27 DIAGNOSIS — Z853 Personal history of malignant neoplasm of breast: Secondary | ICD-10-CM | POA: Diagnosis not present

## 2018-04-27 DIAGNOSIS — Z01419 Encounter for gynecological examination (general) (routine) without abnormal findings: Secondary | ICD-10-CM | POA: Diagnosis not present

## 2018-04-27 DIAGNOSIS — Z6841 Body Mass Index (BMI) 40.0 and over, adult: Secondary | ICD-10-CM | POA: Diagnosis not present

## 2018-04-27 DIAGNOSIS — R922 Inconclusive mammogram: Secondary | ICD-10-CM | POA: Diagnosis not present

## 2018-06-02 DIAGNOSIS — E119 Type 2 diabetes mellitus without complications: Secondary | ICD-10-CM | POA: Diagnosis not present

## 2018-06-02 DIAGNOSIS — H2513 Age-related nuclear cataract, bilateral: Secondary | ICD-10-CM | POA: Diagnosis not present

## 2018-06-02 DIAGNOSIS — H04123 Dry eye syndrome of bilateral lacrimal glands: Secondary | ICD-10-CM | POA: Diagnosis not present

## 2018-06-22 DIAGNOSIS — E038 Other specified hypothyroidism: Secondary | ICD-10-CM | POA: Diagnosis not present

## 2018-06-22 DIAGNOSIS — R82998 Other abnormal findings in urine: Secondary | ICD-10-CM | POA: Diagnosis not present

## 2018-06-22 DIAGNOSIS — R7309 Other abnormal glucose: Secondary | ICD-10-CM | POA: Diagnosis not present

## 2018-06-22 DIAGNOSIS — I10 Essential (primary) hypertension: Secondary | ICD-10-CM | POA: Diagnosis not present

## 2018-06-22 DIAGNOSIS — E7849 Other hyperlipidemia: Secondary | ICD-10-CM | POA: Diagnosis not present

## 2018-06-29 DIAGNOSIS — C50919 Malignant neoplasm of unspecified site of unspecified female breast: Secondary | ICD-10-CM | POA: Diagnosis not present

## 2018-06-29 DIAGNOSIS — Z0000 Encounter for general adult medical examination without abnormal findings: Secondary | ICD-10-CM | POA: Diagnosis not present

## 2018-06-29 DIAGNOSIS — E038 Other specified hypothyroidism: Secondary | ICD-10-CM | POA: Diagnosis not present

## 2018-06-29 DIAGNOSIS — R51 Headache: Secondary | ICD-10-CM | POA: Diagnosis not present

## 2018-06-29 DIAGNOSIS — M545 Low back pain: Secondary | ICD-10-CM | POA: Diagnosis not present

## 2018-06-29 DIAGNOSIS — M109 Gout, unspecified: Secondary | ICD-10-CM | POA: Diagnosis not present

## 2018-06-29 DIAGNOSIS — I10 Essential (primary) hypertension: Secondary | ICD-10-CM | POA: Diagnosis not present

## 2018-06-29 DIAGNOSIS — Z1389 Encounter for screening for other disorder: Secondary | ICD-10-CM | POA: Diagnosis not present

## 2018-06-29 DIAGNOSIS — E7849 Other hyperlipidemia: Secondary | ICD-10-CM | POA: Diagnosis not present

## 2018-06-29 DIAGNOSIS — Z6839 Body mass index (BMI) 39.0-39.9, adult: Secondary | ICD-10-CM | POA: Diagnosis not present

## 2018-06-29 DIAGNOSIS — R7309 Other abnormal glucose: Secondary | ICD-10-CM | POA: Diagnosis not present

## 2018-07-01 DIAGNOSIS — Z1212 Encounter for screening for malignant neoplasm of rectum: Secondary | ICD-10-CM | POA: Diagnosis not present

## 2018-11-08 ENCOUNTER — Other Ambulatory Visit: Payer: Self-pay | Admitting: Cardiovascular Disease

## 2019-01-18 ENCOUNTER — Other Ambulatory Visit: Payer: Self-pay | Admitting: Internal Medicine

## 2019-01-18 DIAGNOSIS — R6889 Other general symptoms and signs: Secondary | ICD-10-CM | POA: Diagnosis not present

## 2019-01-18 DIAGNOSIS — Z20822 Contact with and (suspected) exposure to covid-19: Secondary | ICD-10-CM

## 2019-01-21 LAB — NOVEL CORONAVIRUS, NAA: SARS-CoV-2, NAA: NOT DETECTED

## 2019-01-25 ENCOUNTER — Telehealth: Payer: Self-pay

## 2019-01-25 NOTE — Telephone Encounter (Signed)
Received call from patient regarding Covid results.  Informed patient she is negative.

## 2019-03-18 DIAGNOSIS — I10 Essential (primary) hypertension: Secondary | ICD-10-CM | POA: Diagnosis not present

## 2019-03-18 DIAGNOSIS — E785 Hyperlipidemia, unspecified: Secondary | ICD-10-CM | POA: Diagnosis not present

## 2019-03-18 DIAGNOSIS — R739 Hyperglycemia, unspecified: Secondary | ICD-10-CM | POA: Diagnosis not present

## 2019-03-18 DIAGNOSIS — M545 Low back pain: Secondary | ICD-10-CM | POA: Diagnosis not present

## 2019-03-18 DIAGNOSIS — Z23 Encounter for immunization: Secondary | ICD-10-CM | POA: Diagnosis not present

## 2019-03-18 DIAGNOSIS — E039 Hypothyroidism, unspecified: Secondary | ICD-10-CM | POA: Diagnosis not present

## 2019-03-18 DIAGNOSIS — Z1331 Encounter for screening for depression: Secondary | ICD-10-CM | POA: Diagnosis not present

## 2019-03-18 DIAGNOSIS — R1013 Epigastric pain: Secondary | ICD-10-CM | POA: Diagnosis not present

## 2019-04-09 DIAGNOSIS — Z03818 Encounter for observation for suspected exposure to other biological agents ruled out: Secondary | ICD-10-CM | POA: Diagnosis not present

## 2019-04-29 ENCOUNTER — Other Ambulatory Visit: Payer: Self-pay

## 2019-04-29 DIAGNOSIS — Z20822 Contact with and (suspected) exposure to covid-19: Secondary | ICD-10-CM

## 2019-05-01 LAB — NOVEL CORONAVIRUS, NAA: SARS-CoV-2, NAA: NOT DETECTED

## 2019-05-02 DIAGNOSIS — Z853 Personal history of malignant neoplasm of breast: Secondary | ICD-10-CM | POA: Diagnosis not present

## 2019-05-02 DIAGNOSIS — Z1231 Encounter for screening mammogram for malignant neoplasm of breast: Secondary | ICD-10-CM | POA: Diagnosis not present

## 2019-06-06 DIAGNOSIS — H35033 Hypertensive retinopathy, bilateral: Secondary | ICD-10-CM | POA: Diagnosis not present

## 2019-06-06 DIAGNOSIS — H04123 Dry eye syndrome of bilateral lacrimal glands: Secondary | ICD-10-CM | POA: Diagnosis not present

## 2019-06-06 DIAGNOSIS — H2513 Age-related nuclear cataract, bilateral: Secondary | ICD-10-CM | POA: Diagnosis not present

## 2019-06-06 DIAGNOSIS — H524 Presbyopia: Secondary | ICD-10-CM | POA: Diagnosis not present

## 2019-06-06 DIAGNOSIS — E119 Type 2 diabetes mellitus without complications: Secondary | ICD-10-CM | POA: Diagnosis not present

## 2019-06-28 DIAGNOSIS — E7849 Other hyperlipidemia: Secondary | ICD-10-CM | POA: Diagnosis not present

## 2019-06-28 DIAGNOSIS — E038 Other specified hypothyroidism: Secondary | ICD-10-CM | POA: Diagnosis not present

## 2019-06-28 DIAGNOSIS — R739 Hyperglycemia, unspecified: Secondary | ICD-10-CM | POA: Diagnosis not present

## 2019-06-29 DIAGNOSIS — R82998 Other abnormal findings in urine: Secondary | ICD-10-CM | POA: Diagnosis not present

## 2019-07-04 ENCOUNTER — Other Ambulatory Visit: Payer: Self-pay

## 2019-07-04 DIAGNOSIS — Z20828 Contact with and (suspected) exposure to other viral communicable diseases: Secondary | ICD-10-CM | POA: Diagnosis not present

## 2019-07-04 DIAGNOSIS — Z20822 Contact with and (suspected) exposure to covid-19: Secondary | ICD-10-CM

## 2019-07-05 LAB — NOVEL CORONAVIRUS, NAA: SARS-CoV-2, NAA: NOT DETECTED

## 2019-07-20 DIAGNOSIS — Z1212 Encounter for screening for malignant neoplasm of rectum: Secondary | ICD-10-CM | POA: Diagnosis not present

## 2019-08-16 ENCOUNTER — Ambulatory Visit: Payer: Federal, State, Local not specified - PPO | Attending: Internal Medicine

## 2019-08-16 DIAGNOSIS — Z20822 Contact with and (suspected) exposure to covid-19: Secondary | ICD-10-CM

## 2019-08-17 LAB — NOVEL CORONAVIRUS, NAA: SARS-CoV-2, NAA: NOT DETECTED

## 2019-10-14 ENCOUNTER — Other Ambulatory Visit: Payer: Self-pay

## 2019-10-14 ENCOUNTER — Encounter: Payer: Self-pay | Admitting: Cardiovascular Disease

## 2019-10-14 ENCOUNTER — Ambulatory Visit (INDEPENDENT_AMBULATORY_CARE_PROVIDER_SITE_OTHER): Payer: Medicare Other | Admitting: Cardiovascular Disease

## 2019-10-14 DIAGNOSIS — I10 Essential (primary) hypertension: Secondary | ICD-10-CM | POA: Diagnosis not present

## 2019-10-14 DIAGNOSIS — E782 Mixed hyperlipidemia: Secondary | ICD-10-CM | POA: Diagnosis not present

## 2019-10-14 DIAGNOSIS — I208 Other forms of angina pectoris: Secondary | ICD-10-CM | POA: Diagnosis not present

## 2019-10-14 MED ORDER — PANTOPRAZOLE SODIUM 40 MG PO TBEC: 40.0000 mg | DELAYED_RELEASE_TABLET | Freq: Every day | ORAL | 3 refills | Status: DC

## 2019-10-14 NOTE — Assessment & Plan Note (Signed)
History of essential hypertension blood pressure measured today 112/74.  She is on Dyazide and Diovan.

## 2019-10-14 NOTE — Assessment & Plan Note (Signed)
History of hyperlipidemia not on statin therapy with lipid profile performed 06/28/2019 revealing a total cholesterol 145, LDL of 86 and HDL of 48.

## 2019-10-14 NOTE — Patient Instructions (Signed)
Medication Instructions:  START PANTOPRAZOLE 40 MG ONCE DAILY  *If you need a refill on your cardiac medications before your next appointment, please call your pharmacy*   Lab Work: If you have labs (blood work) drawn today and your tests are completely normal, you will receive your results only by: Marland Kitchen MyChart Message (if you have MyChart) OR . A paper copy in the mail If you have any lab test that is abnormal or we need to change your treatment, we will call you to review the results.   Follow-Up: At [facility], you and your health needs are our priority.  As part of our continuing mission to provide you with exceptional heart care, we have created designated Provider Care Teams.  These Care Teams include your primary Cardiologist (physician) and Advanced Practice Providers (APPs -  Physician Assistants and Nurse Practitioners) who all work together to provide you with the care you need, when you need it.  We recommend signing up for the patient portal called "MyChart".  Sign up information is provided on this After Visit Summary.  MyChart is used to connect with patients for Virtual Visits (Telemedicine).  Patients are able to view lab/test results, encounter notes, upcoming appointments, etc.  Non-urgent messages can be sent to your provider as well.   To learn more about what you can do with MyChart, go to [email or web address].    Your next appointment:   12 month(s)  The format for your next appointment:   In Person  Provider:   You may see Quay Burow MD or one of the following Advanced Practice Providers on your designated Care Team:    Kerin Ransom, PA-C  [place], [place]  Coletta Memos, [place]

## 2019-10-14 NOTE — Progress Notes (Signed)
10/14/2019 [place]   1950-09-19  [id number]  Primary Physician Leanna Battles, MD Primary Cardiologist: Lorretta Harp MD Lupe Carney, [place]  HPI:  Kathryn Diaz is a 69 y.o.  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  I last saw her in the office  09/19/2016.Marland Kitchen She was seen at [facility] January 15, 2012 through January 16, 2012, with atypical chest pain like "somebody scared her." She ruled out for MI by troponins. She did have some second-degree AV block Mobitz type I which she also had on event monitoring as an outpatient. A 2D echo performed in the hospital was normal. A Myoview stress test performed in our office on January 23, 2012, was normal as well. She has had no recurrent symptoms. Her risk factors include hypertension, hyperlipidemia and diabetes, as well as a family history with a half-brother that had an MI.    Since I saw her 3 years ago she continues to do well.  She does get occasional chest burning postprandial and sounds more like GERD.  She otherwise has remained stable from a cardiovascular point of view.  I did prescribe Protonix in the past.   Current Meds  Medication Sig  . allopurinol (ZYLOPRIM) 300 MG tablet Take 300 mg by mouth daily.  . carvedilol (COREG) 3.125 MG tablet Take 3.125 mg by mouth 2 (two) times daily with a meal.  . levothyroxine (SYNTHROID, LEVOTHROID) 112 MCG tablet Take 112 mcg by mouth daily.  . metFORMIN (GLUCOPHAGE XR) 500 MG 24 hr tablet Take 500 mg by mouth daily with breakfast.   . pantoprazole (PROTONIX) 40 MG tablet Take 1 tablet by mouth once daily  . triamterene-hydrochlorothiazide (DYAZIDE) 37.5-25 MG capsule Take 1 capsule by mouth daily.  . valsartan (DIOVAN) 320 MG tablet Take 320 mg by mouth daily.     No Known Allergies  Social History    Socioeconomic History  . Marital status: Divorced    Spouse name: Not on file  . Number of children: 5  . Years of education: Not on file  . Highest education level: Not on file  Occupational History  . 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 Smoking status: Never Smoker  . Smokeless tobacco: Never Used  Substance and Sexual Activity  . Alcohol use: No  . Drug use: No  . Sexual activity: Never  Other Topics Concern  . Not on file  Social History Narrative  . Not on file   Social Determinants of Health   Financial Resource Strain:   . Difficulty of Paying Living Expenses:   Food Insecurity:   . Worried About Charity fundraiser in the Last Year:   . Arboriculturist in the Last Year:   Transportation Needs:   . Film/video editor (Medical):   Marland Kitchen Lack of Transportation (Non-Medical):   Physical Activity:   . Days of Exercise per Week:   . Minutes of Exercise per Session:   Stress:   . Feeling of Stress :   Social Connections:   . Frequency of Communication with Friends and Family:   . Frequency of Social Gatherings with Friends and Family:   . Attends Religious Services:   . Active Member of Clubs or Organizations:   .  Attends Archivist Meetings:   Marland Kitchen Marital Status:   Intimate Partner Violence:   . Fear of Current or Ex-Partner:   . Emotionally Abused:   Marland Kitchen Physically Abused:   . Sexually Abused:      Review of Systems: General: negative for chills, fever, night sweats or weight changes.  Cardiovascular: negative for chest pain, dyspnea on exertion, edema, orthopnea, palpitations, paroxysmal nocturnal dyspnea or shortness of breath Dermatological: negative for rash Respiratory: negative for cough or wheezing Urologic: negative for hematuria Abdominal: negative for nausea, vomiting, diarrhea, bright red blood per rectum, melena, or hematemesis Neurologic: negative for visual changes, syncope, or  dizziness All other systems reviewed and are otherwise negative except as noted above.    Blood pressure 112/74, pulse 74, height 5\' 5"  (1.651 m), weight 240 lb (108.9 kg).  General appearance: alert and no distress Neck: no adenopathy, no carotid bruit, no JVD, supple, symmetrical, trachea midline and thyroid not enlarged, symmetric, no tenderness/mass/nodules Lungs: clear to auscultation bilaterally Heart: regular rate and rhythm, S1, S2 normal, no murmur, click, rub or gallop Extremities: extremities normal, atraumatic, no cyanosis or edema Pulses: 2+ and symmetric Skin: Skin color, texture, turgor normal. No rashes or lesions Neurologic: Alert and oriented X 3, normal strength and tone. Normal symmetric reflexes. Normal coordination and gait  EKG normal sinus rhythm at 74 with left anterior fascicular block.  Personally reviewed this EKG.  ASSESSMENT AND PLAN:   Chest pain, negative MI, normal Echo, outpt. Nuc. stress test History of atypical chest pain which sounds more like reflux with a normal Myoview back in 2013.  The pain occurs principally after eating.  I did prescribe her Protonix when I saw her 3 years ago which she did not fill.  We will prescribe her Protonix today.  Hypertension History of essential hypertension blood pressure measured today 112/74.  She is on Dyazide and Diovan.  Hyperlipidemia History of hyperlipidemia not on statin therapy with lipid profile performed 06/28/2019 revealing a total cholesterol 145, LDL of 86 and HDL of 48.      Lorretta Harp MD FACP,FACC,FAHA, [facility] 10/14/2019 11:03 AM

## 2019-10-14 NOTE — Assessment & Plan Note (Signed)
History of atypical chest pain which sounds more like reflux with a normal Myoview back in 2013.  The pain occurs principally after eating.  I did prescribe her Protonix when I saw her 3 years ago which she did not fill.  We will prescribe her Protonix today.

## 2019-11-21 DIAGNOSIS — R1013 Epigastric pain: Secondary | ICD-10-CM | POA: Diagnosis not present

## 2019-11-21 DIAGNOSIS — I10 Essential (primary) hypertension: Secondary | ICD-10-CM | POA: Diagnosis not present

## 2019-11-21 DIAGNOSIS — E7849 Other hyperlipidemia: Secondary | ICD-10-CM | POA: Diagnosis not present

## 2019-11-21 DIAGNOSIS — E038 Other specified hypothyroidism: Secondary | ICD-10-CM | POA: Diagnosis not present

## 2019-12-12 DIAGNOSIS — S83242A Other tear of medial meniscus, current injury, left knee, initial encounter: Secondary | ICD-10-CM | POA: Diagnosis not present

## 2019-12-12 DIAGNOSIS — M25571 Pain in right ankle and joints of right foot: Secondary | ICD-10-CM | POA: Diagnosis not present

## 2019-12-28 DIAGNOSIS — R1013 Epigastric pain: Secondary | ICD-10-CM | POA: Diagnosis not present

## 2019-12-28 DIAGNOSIS — Z86010 Personal history of colonic polyps: Secondary | ICD-10-CM | POA: Diagnosis not present

## 2019-12-28 DIAGNOSIS — R1084 Generalized abdominal pain: Secondary | ICD-10-CM | POA: Diagnosis not present

## 2020-01-03 DIAGNOSIS — E785 Hyperlipidemia, unspecified: Secondary | ICD-10-CM | POA: Diagnosis not present

## 2020-01-03 DIAGNOSIS — Z1331 Encounter for screening for depression: Secondary | ICD-10-CM | POA: Diagnosis not present

## 2020-01-03 DIAGNOSIS — I10 Essential (primary) hypertension: Secondary | ICD-10-CM | POA: Diagnosis not present

## 2020-01-03 DIAGNOSIS — R1013 Epigastric pain: Secondary | ICD-10-CM | POA: Diagnosis not present

## 2020-01-03 DIAGNOSIS — R739 Hyperglycemia, unspecified: Secondary | ICD-10-CM | POA: Diagnosis not present

## 2020-01-03 DIAGNOSIS — E669 Obesity, unspecified: Secondary | ICD-10-CM | POA: Diagnosis not present

## 2020-01-31 DIAGNOSIS — Z1211 Encounter for screening for malignant neoplasm of colon: Secondary | ICD-10-CM | POA: Diagnosis not present

## 2020-01-31 DIAGNOSIS — Z86010 Personal history of colonic polyps: Secondary | ICD-10-CM | POA: Diagnosis not present

## 2020-01-31 DIAGNOSIS — K449 Diaphragmatic hernia without obstruction or gangrene: Secondary | ICD-10-CM | POA: Diagnosis not present

## 2020-01-31 DIAGNOSIS — R1013 Epigastric pain: Secondary | ICD-10-CM | POA: Diagnosis not present

## 2020-01-31 DIAGNOSIS — R109 Unspecified abdominal pain: Secondary | ICD-10-CM | POA: Diagnosis not present

## 2020-01-31 DIAGNOSIS — D175 Benign lipomatous neoplasm of intra-abdominal organs: Secondary | ICD-10-CM | POA: Diagnosis not present

## 2020-01-31 LAB — HM COLONOSCOPY

## 2020-02-02 DIAGNOSIS — M25562 Pain in left knee: Secondary | ICD-10-CM | POA: Diagnosis not present

## 2020-02-06 DIAGNOSIS — S83242A Other tear of medial meniscus, current injury, left knee, initial encounter: Secondary | ICD-10-CM | POA: Diagnosis not present

## 2020-05-24 DIAGNOSIS — Z01419 Encounter for gynecological examination (general) (routine) without abnormal findings: Secondary | ICD-10-CM | POA: Diagnosis not present

## 2020-05-24 DIAGNOSIS — Z6841 Body Mass Index (BMI) 40.0 and over, adult: Secondary | ICD-10-CM | POA: Diagnosis not present

## 2020-06-06 DIAGNOSIS — E119 Type 2 diabetes mellitus without complications: Secondary | ICD-10-CM | POA: Diagnosis not present

## 2020-06-06 DIAGNOSIS — H04123 Dry eye syndrome of bilateral lacrimal glands: Secondary | ICD-10-CM | POA: Diagnosis not present

## 2020-06-06 DIAGNOSIS — H524 Presbyopia: Secondary | ICD-10-CM | POA: Diagnosis not present

## 2020-06-06 DIAGNOSIS — H35033 Hypertensive retinopathy, bilateral: Secondary | ICD-10-CM | POA: Diagnosis not present

## 2020-06-06 DIAGNOSIS — H25813 Combined forms of age-related cataract, bilateral: Secondary | ICD-10-CM | POA: Diagnosis not present

## 2020-07-06 DIAGNOSIS — E039 Hypothyroidism, unspecified: Secondary | ICD-10-CM | POA: Diagnosis not present

## 2020-07-06 DIAGNOSIS — E785 Hyperlipidemia, unspecified: Secondary | ICD-10-CM | POA: Diagnosis not present

## 2020-07-06 DIAGNOSIS — R739 Hyperglycemia, unspecified: Secondary | ICD-10-CM | POA: Diagnosis not present

## 2020-07-13 DIAGNOSIS — E039 Hypothyroidism, unspecified: Secondary | ICD-10-CM | POA: Diagnosis not present

## 2020-07-13 DIAGNOSIS — Z23 Encounter for immunization: Secondary | ICD-10-CM | POA: Diagnosis not present

## 2020-07-13 DIAGNOSIS — M109 Gout, unspecified: Secondary | ICD-10-CM | POA: Diagnosis not present

## 2020-07-13 DIAGNOSIS — Z0000 Encounter for general adult medical examination without abnormal findings: Secondary | ICD-10-CM | POA: Diagnosis not present

## 2020-07-13 DIAGNOSIS — E785 Hyperlipidemia, unspecified: Secondary | ICD-10-CM | POA: Diagnosis not present

## 2020-07-13 DIAGNOSIS — I700 Atherosclerosis of aorta: Secondary | ICD-10-CM | POA: Diagnosis not present

## 2020-07-13 DIAGNOSIS — I10 Essential (primary) hypertension: Secondary | ICD-10-CM | POA: Diagnosis not present

## 2020-07-13 DIAGNOSIS — R739 Hyperglycemia, unspecified: Secondary | ICD-10-CM | POA: Diagnosis not present

## 2020-07-13 DIAGNOSIS — Z853 Personal history of malignant neoplasm of breast: Secondary | ICD-10-CM | POA: Diagnosis not present

## 2020-07-13 DIAGNOSIS — H6121 Impacted cerumen, right ear: Secondary | ICD-10-CM | POA: Diagnosis not present

## 2020-07-13 DIAGNOSIS — R82998 Other abnormal findings in urine: Secondary | ICD-10-CM | POA: Diagnosis not present

## 2020-07-17 DIAGNOSIS — Z1212 Encounter for screening for malignant neoplasm of rectum: Secondary | ICD-10-CM | POA: Diagnosis not present

## 2020-07-25 DIAGNOSIS — M25552 Pain in left hip: Secondary | ICD-10-CM | POA: Diagnosis not present

## 2020-07-25 DIAGNOSIS — M545 Low back pain, unspecified: Secondary | ICD-10-CM | POA: Diagnosis not present

## 2020-08-08 DIAGNOSIS — M25552 Pain in left hip: Secondary | ICD-10-CM | POA: Diagnosis not present

## 2020-08-08 DIAGNOSIS — M545 Low back pain, unspecified: Secondary | ICD-10-CM | POA: Diagnosis not present

## 2020-09-06 DIAGNOSIS — J302 Other seasonal allergic rhinitis: Secondary | ICD-10-CM | POA: Diagnosis not present

## 2020-09-06 DIAGNOSIS — R519 Headache, unspecified: Secondary | ICD-10-CM | POA: Diagnosis not present

## 2020-09-07 DIAGNOSIS — E119 Type 2 diabetes mellitus without complications: Secondary | ICD-10-CM | POA: Diagnosis not present

## 2020-09-07 DIAGNOSIS — H35033 Hypertensive retinopathy, bilateral: Secondary | ICD-10-CM | POA: Diagnosis not present

## 2020-09-07 DIAGNOSIS — H04123 Dry eye syndrome of bilateral lacrimal glands: Secondary | ICD-10-CM | POA: Diagnosis not present

## 2020-09-07 DIAGNOSIS — H25813 Combined forms of age-related cataract, bilateral: Secondary | ICD-10-CM | POA: Diagnosis not present

## 2020-09-11 DIAGNOSIS — E039 Hypothyroidism, unspecified: Secondary | ICD-10-CM | POA: Diagnosis not present

## 2020-10-16 ENCOUNTER — Other Ambulatory Visit: Payer: Self-pay

## 2020-10-16 ENCOUNTER — Ambulatory Visit (INDEPENDENT_AMBULATORY_CARE_PROVIDER_SITE_OTHER): Payer: Medicare Other | Admitting: Cardiovascular Disease

## 2020-10-16 ENCOUNTER — Encounter: Payer: Self-pay | Admitting: Cardiovascular Disease

## 2020-10-16 VITALS — BP 112/72 | HR 66 | Ht 65.0 in | Wt 229.8 lb

## 2020-10-16 DIAGNOSIS — E782 Mixed hyperlipidemia: Secondary | ICD-10-CM | POA: Diagnosis not present

## 2020-10-16 DIAGNOSIS — I10 Essential (primary) hypertension: Secondary | ICD-10-CM | POA: Diagnosis not present

## 2020-10-16 NOTE — Assessment & Plan Note (Signed)
History of essential hypertension blood pressure measured today 112/72.  She is on carvedilol, Dyazide, and losartan.

## 2020-10-16 NOTE — Patient Instructions (Signed)

## 2020-10-16 NOTE — Assessment & Plan Note (Signed)
History of hyperlipidemia on statin therapy with lipid profile performed 07/06/2020 revealing a total cholesterol 147, LDL of 82 and HDL 49.

## 2020-10-16 NOTE — Assessment & Plan Note (Signed)
History of chest pain in the past back in 2013 with a negative Myoview stress test 01/23/2012 and normal 2D echocardiogram.  She gets occasional atypical chest pain which does not sound cardiac.

## 2020-10-16 NOTE — Progress Notes (Signed)
10/16/2020 Kathryn Diaz   1951-01-15  [id number]  Primary Physician Leanna Battles, MD Primary Cardiologist: Lorretta Harp MD Lupe Carney, [place]  HPI:  Kathryn Diaz is a 70 y.o.  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  I last saw her in the office  10/14/2019.Marland Kitchen She was seen at [facility] January 15, 2012 through January 16, 2012, with atypical chest pain like "somebody scared her." She ruled out for MI by troponins. She did have some second-degree AV block Mobitz type I which she also had on event monitoring as an outpatient. A 2D echo performed in the hospital was normal. A Myoview stress test performed in our office on January 23, 2012, was normal as well. She has had no recurrent symptoms. Her risk factors include hypertension, hyperlipidemia and diabetes, as well as a family history with a half-brother that had an MI.    Since I saw her in the office a year ago she continues to do well.  She gets occasional atypical noncardiac chest pain which sounds more like GERD.  She denies shortness of breath.   Current Meds  Medication Sig  . allopurinol (ZYLOPRIM) 300 MG tablet Take 300 mg by mouth daily.  . carvedilol (COREG) 3.125 MG tablet Take 3.125 mg by mouth 2 (two) times daily with a meal.  . levothyroxine (SYNTHROID, LEVOTHROID) 112 MCG tablet Take 112 mcg by mouth daily.  Marland Kitchen losartan (COZAAR) 100 MG tablet TAKE 1/2 (ONE-HALF) TABLET BY MOUTH ONCE DAILY  . meloxicam (MOBIC) 15 MG tablet TAKE 1 TABLET BY MOUTH ONCE DAILY AS NEEDED FOR PAIN FOR 90 DAYS  . metFORMIN (GLUCOPHAGE-XR) 500 MG 24 hr tablet Take 500 mg by mouth daily with breakfast.  . pantoprazole (PROTONIX) 40 MG tablet Take 1 tablet (40 mg total) by mouth daily.  . simvastatin (ZOCOR) 80 MG tablet Take 1 tablet by mouth daily.  Marland Kitchen  triamterene-hydrochlorothiazide (DYAZIDE) 37.5-25 MG capsule Take 1 capsule by mouth daily.     No Known Allergies  Social History   Socioeconomic History  . Marital status: Divorced    Spouse name: Not on file  . Number of children: 5  . Years of education: Not on file  . Highest education level: Not on file  Occupational History  . 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 Smoking status: Never Smoker  . Smokeless tobacco: Never Used  Substance and Sexual Activity  . Alcohol use: No  . Drug use: No  . Sexual activity: Never  Other Topics Concern  . Not on file  Social History Narrative  . Not on file   Social Determinants of Health   Financial Resource Strain: Not on file  Food Insecurity: Not on file  Transportation Needs: Not on file  Physical Activity: Not on file  Stress: Not on file  Social Connections: Not on file  Intimate Partner Violence: Not on file     Review of Systems: General: negative for chills, fever, night sweats or weight changes.  Cardiovascular: negative for chest pain, dyspnea on exertion, edema, orthopnea, palpitations, paroxysmal nocturnal dyspnea or shortness of breath Dermatological: negative for rash Respiratory: negative for cough or wheezing Urologic: negative for hematuria Abdominal: negative for nausea, vomiting, diarrhea, bright red blood per rectum, melena,  or hematemesis Neurologic: negative for visual changes, syncope, or dizziness All other systems reviewed and are otherwise negative except as noted above.    Blood pressure 112/72, pulse 66, height 5\' 5"  (1.651 m), weight 229 lb 12.8 oz (104.2 kg).  General appearance: alert and no distress Neck: no adenopathy, no carotid bruit, no JVD, supple, symmetrical, trachea midline and thyroid not enlarged, symmetric, no tenderness/mass/nodules Lungs: clear to auscultation bilaterally Heart: regular rate and rhythm, S1, S2 normal, no  murmur, click, rub or gallop Extremities: extremities normal, atraumatic, no cyanosis or edema Pulses: 2+ and symmetric Skin: Skin color, texture, turgor normal. No rashes or lesions Neurologic: Alert and oriented X 3, normal strength and tone. Normal symmetric reflexes. Normal coordination and gait  EKG sinus rhythm at 66 with poor R wave progression.  I personally reviewed this EKG.  ASSESSMENT AND PLAN:   Chest pain, negative MI, normal Echo, outpt. Nuc. stress test History of chest pain in the past back in 2013 with a negative Myoview stress test 01/23/2012 and normal 2D echocardiogram.  She gets occasional atypical chest pain which does not sound cardiac.  Hypertension History of essential hypertension blood pressure measured today 112/72.  She is on carvedilol, Dyazide, and losartan.  Hyperlipidemia History of hyperlipidemia on statin therapy with lipid profile performed 07/06/2020 revealing a total cholesterol 147, LDL of 82 and HDL 49.      Lorretta Harp MD FACP,FACC,FAHA, [facility] 10/16/2020 11:26 AM

## 2020-10-22 ENCOUNTER — Other Ambulatory Visit (HOSPITAL_COMMUNITY): Payer: Self-pay | Admitting: Internal Medicine

## 2020-10-22 DIAGNOSIS — G4452 New daily persistent headache (NDPH): Secondary | ICD-10-CM | POA: Diagnosis not present

## 2020-10-22 DIAGNOSIS — Z1339 Encounter for screening examination for other mental health and behavioral disorders: Secondary | ICD-10-CM | POA: Diagnosis not present

## 2020-10-22 DIAGNOSIS — E119 Type 2 diabetes mellitus without complications: Secondary | ICD-10-CM | POA: Diagnosis not present

## 2020-10-22 DIAGNOSIS — F5101 Primary insomnia: Secondary | ICD-10-CM | POA: Diagnosis not present

## 2020-10-22 DIAGNOSIS — Z1331 Encounter for screening for depression: Secondary | ICD-10-CM | POA: Diagnosis not present

## 2020-10-25 ENCOUNTER — Other Ambulatory Visit: Payer: Self-pay

## 2020-10-25 ENCOUNTER — Ambulatory Visit (HOSPITAL_COMMUNITY)
Admission: RE | Admit: 2020-10-25 | Discharge: 2020-10-25 | Disposition: A | Discharge status: Home / Self Care | Payer: Medicare Other | Source: Ambulatory Visit | Attending: Internal Medicine | Admitting: Internal Medicine

## 2020-10-25 DIAGNOSIS — J019 Acute sinusitis, unspecified: Secondary | ICD-10-CM | POA: Diagnosis not present

## 2020-10-25 DIAGNOSIS — R519 Headache, unspecified: Secondary | ICD-10-CM | POA: Diagnosis not present

## 2020-10-25 DIAGNOSIS — G4452 New daily persistent headache (NDPH): Secondary | ICD-10-CM | POA: Diagnosis not present

## 2020-10-25 MED ORDER — GADOBUTROL 1 MMOL/ML IV SOLN
10.0000 mL | Freq: Once | INTRAVENOUS | Status: AC | PRN
  Administered 2020-10-25: 10 mL via INTRAVENOUS

## 2020-11-01 ENCOUNTER — Other Ambulatory Visit: Payer: Self-pay

## 2020-11-01 ENCOUNTER — Emergency Department (HOSPITAL_COMMUNITY)
Admission: EM | Admit: 2020-11-01 | Discharge: 2020-11-01 | Disposition: A | Discharge status: Home / Self Care | Payer: Medicare Other | Attending: Emergency Medicine | Admitting: Emergency Medicine

## 2020-11-01 ENCOUNTER — Encounter (HOSPITAL_COMMUNITY): Payer: Self-pay

## 2020-11-01 DIAGNOSIS — I10 Essential (primary) hypertension: Secondary | ICD-10-CM | POA: Insufficient documentation

## 2020-11-01 DIAGNOSIS — Z79899 Other long term (current) drug therapy: Secondary | ICD-10-CM | POA: Insufficient documentation

## 2020-11-01 DIAGNOSIS — Z7984 Long term (current) use of oral hypoglycemic drugs: Secondary | ICD-10-CM | POA: Diagnosis not present

## 2020-11-01 DIAGNOSIS — E039 Hypothyroidism, unspecified: Secondary | ICD-10-CM | POA: Diagnosis not present

## 2020-11-01 DIAGNOSIS — Z853 Personal history of malignant neoplasm of breast: Secondary | ICD-10-CM | POA: Diagnosis not present

## 2020-11-01 DIAGNOSIS — R519 Headache, unspecified: Secondary | ICD-10-CM | POA: Insufficient documentation

## 2020-11-01 DIAGNOSIS — E119 Type 2 diabetes mellitus without complications: Secondary | ICD-10-CM | POA: Diagnosis not present

## 2020-11-01 MED ORDER — IBUPROFEN 200 MG PO TABS
400.0000 mg | ORAL_TABLET | Freq: Once | ORAL | Status: AC
  Administered 2020-11-01: 400 mg via ORAL
  Filled 2020-11-01: qty 2

## 2020-11-01 MED ORDER — METOCLOPRAMIDE HCL 10 MG PO TABS
10.0000 mg | ORAL_TABLET | Freq: Once | ORAL | Status: AC
  Administered 2020-11-01: 10 mg via ORAL
  Filled 2020-11-01: qty 1

## 2020-11-01 NOTE — Discharge Instructions (Signed)
It was our pleasure to provide your ER care today - we hope that you feel better.  Take excedrin or ibuprofen as need for headache.   Drink plenty of fluids/stay well hydrated.   Follow up with neurologist in the next 1-2 weeks - call office today to arrange appointment.   Return to ER if worse, new symptoms, high fevers, persistent vomiting, severe head pain, numbness/weakness, or other concern.

## 2020-11-01 NOTE — ED Triage Notes (Signed)
Patient reports that she has had a headache since the beginning of the year, but worse last night. Patient states she has had a CT scan, but does not know the results.

## 2020-11-01 NOTE — ED Provider Notes (Signed)
[facility] DEPT Provider Note   CSN: [id number] Arrival date & time: 11/01/20  3716     History Chief Complaint  Patient presents with  . Headache    Kathryn Diaz is a 70 y.o. female.  Patient c/o headaches intermittently for the past year. States occur almost day, mainly frontal, bilateral, but occasionally also feels in occipital area. Headache, dull, moderate, recurrent, almost daily. Occur randomly, no specific or consistent exacerbating or alleviating factors. Not worse on left, or right. No positional change. No change w time of day or activity. No photophobia or phonophobia. No recent sinus congestion or drainage. No eye pain or change in vision. No neck pain/stiffness. No recent head trauma. No syncope. States had recent mri for same. Takes acetaminophen prn. Drove self to ED. Indicates yesterday had covid booster and pneumovac shot. No fever/chills. No vomiting. Occasional nausea. Normal appetite. No wt loss. Denies any associated numbness, weakness, problems w balance or coordination, or loss of normal functional ability.   The history is provided by the patient.  Headache Associated symptoms: no abdominal pain, no congestion, no cough, no eye pain, no fever, no neck pain, no neck stiffness, no numbness, no vomiting and no weakness        Past Medical History:  Diagnosis Date  . Atrioventricular block, second degree    Mobitz Type 1  . Breast cancer ([place])   . Headache(784.0)   . History of nuclear stress test    lexiscan; normal study   . Hypercholesteremia   . Hypertension   . Hypothyroidism   . Type II diabetes mellitus ([place])    "borderline"    Patient Active Problem List   Diagnosis Date Noted  . Second degree AV block, Mobitz type I 01/16/2012  . Bradycardia 01/16/2012  . Chest pain, negative MI, normal Echo, outpt. Nuc. stress test 01/15/2012  . Diabetes mellitus ([place]) 01/15/2012  . Hypertension 01/15/2012  .  Hyperlipidemia 01/15/2012  . Hypothyroidism 01/15/2012    Past Surgical History:  Procedure Laterality Date  . BREAST RECONSTRUCTION  1983  . COLONOSCOPY  2013  . LAPAROSCOPY  11/11/2011   Procedure: LAPAROSCOPY OPERATIVE;  Surgeon: Gus Height, MD;  Location: [facility];  Service: Gynecology;  Laterality: N/A;  . MASTECTOMY  1982   RIGHT BREAST   . REPAIR TENDONS FOOT  ~ 2000   right ankle  . RIGHT ANKLE REPAIR  1989  . SALPINGOOPHORECTOMY  11/11/2011   Procedure: SALPINGO OOPHERECTOMY;  Surgeon: Gus Height, MD;  Location: [facility];  Service: Gynecology;  Laterality: Bilateral;  . TRANSTHORACIC ECHOCARDIOGRAM  12/2011   EF 60-65%; mod conc LVH, abnormal LV relaxation (grade 1 diastolic dysfunction); mild central MR; RA mildly dilated; mild TR  . TUBAL LIGATION  1974     OB History   No obstetric history on file.     Family History  Problem Relation Age of Onset  . Clotting disorder Mother        blood clot  . Heart Problems Brother   . Kidney disease Brother   . Other Brother        cerebral hemorrhage  . Lung cancer Sister     Social History   Tobacco Use  . Smoking status: Never Smoker  . Smokeless tobacco: Never Used  Vaping Use  . Vaping Use: Never used  Substance Use Topics  . Alcohol use: No  . Drug use: No    Home Medications Prior to Admission medications  Medication Sig Start Date End Date Taking? Authorizing Provider  allopurinol (ZYLOPRIM) 300 MG tablet Take 300 mg by mouth daily.    [provider]  carvedilol (COREG) 3.125 MG tablet Take 3.125 mg by mouth 2 (two) times daily with a meal.    [provider]  levothyroxine (SYNTHROID, LEVOTHROID) 112 MCG tablet Take 112 mcg by mouth daily.    [provider]  losartan (COZAAR) 100 MG tablet TAKE 1/2 (ONE-HALF) TABLET BY MOUTH ONCE DAILY 08/31/20   [provider]  meloxicam (MOBIC) 15 MG tablet TAKE 1 TABLET BY MOUTH ONCE DAILY AS NEEDED FOR PAIN FOR 90 DAYS 08/09/20    [provider]  metFORMIN (GLUCOPHAGE-XR) 500 MG 24 hr tablet Take 500 mg by mouth daily with breakfast.    [provider]  pantoprazole (PROTONIX) 40 MG tablet Take 1 tablet (40 mg total) by mouth daily. 10/14/19   Lorretta Harp, MD  simvastatin (ZOCOR) 80 MG tablet Take 1 tablet by mouth daily.    [provider]  triamterene-hydrochlorothiazide (DYAZIDE) 37.5-25 MG capsule Take 1 capsule by mouth daily.    [provider]    Allergies    Patient has no known allergies.  Review of Systems   Review of Systems  Constitutional: Negative for chills and fever.  HENT: Negative for congestion and rhinorrhea.   Eyes: Negative for pain, redness and visual disturbance.  Respiratory: Negative for cough and shortness of breath.   Cardiovascular: Negative for chest pain.  Gastrointestinal: Negative for abdominal pain and vomiting.  Genitourinary: Negative for flank pain.  Musculoskeletal: Negative for neck pain and neck stiffness.  Skin: Negative for rash.  Neurological: Positive for headaches. Negative for syncope, speech difficulty, weakness and numbness.  Hematological: Does not bruise/bleed easily.  Psychiatric/Behavioral: Negative for confusion.    Physical Exam Updated Vital Signs BP (!) 151/87 (BP Location: Left Arm)   Pulse 96   Temp 99.5 F (37.5 C) (Oral)   Resp 16   Ht 1.651 m (5\' 5" )   Wt 103.9 kg   SpO2 99%   BMI 38.11 kg/m   Physical Exam Vitals and nursing note reviewed.  Constitutional:      General: She is not in acute distress.    Appearance: Normal appearance. She is well-developed. She is not diaphoretic.  HENT:     Head: Atraumatic.     Comments: No sinus or temporal tenderness.     Nose: Nose normal.  Eyes:     General: No scleral icterus.    Extraocular Movements: Extraocular movements intact.     Conjunctiva/sclera: Conjunctivae normal.     Pupils: Pupils are equal, round, and reactive to light.  Neck:      Thyroid: No thyromegaly.     Trachea: No tracheal deviation.     Comments: No stiffness or rigidity.  Cardiovascular:     Rate and Rhythm: Normal rate and regular rhythm.     Heart sounds: Normal heart sounds. No murmur heard. No friction rub. No gallop.   Pulmonary:     Effort: Pulmonary effort is normal. No respiratory distress.     Breath sounds: Normal breath sounds.  Abdominal:     General: There is no distension.  Genitourinary:    Comments: No cva tenderness. Musculoskeletal:        General: No swelling.     Cervical back: Neck supple.     Right lower leg: No edema.     Left lower leg: No edema.  Skin:    General: Skin is warm and dry.     Findings: No rash.  Neurological:     Mental Status: She is alert and oriented to person, place, and time.     Cranial Nerves: No cranial nerve deficit.     Comments: Speech normal. Motor intact bil, stre 5/5. No pronator drift. Sens grossly intact bil. Steady gait.   Psychiatric:     Comments: Alert, content.      ED Results / Procedures / Treatments   Labs (all labs ordered are listed, but only abnormal results are displayed) Labs Reviewed - No data to display  EKG None  Radiology No results found.  Procedures Procedures   Medications Ordered in ED Medications  ibuprofen (ADVIL) tablet 400 mg (has no administration in time range)  metoCLOPramide (REGLAN) tablet 10 mg (has no administration in time range)    ED Course  I have reviewed the triage vital signs and the nursing notes.  Pertinent labs & imaging results that were available during my care of the patient were reviewed by me and considered in my medical decision making (see chart for details).    MDM Rules/Calculators/A&P                         Recent MRI reviewed - neg acute. Note made of ?chronic inflamm change left sphenoid sinus area - also noted on imaging from 2019. No uri/sinus symptoms. Headache bil, frontal and occipital.   Pt appears not toxic,  overall well. Took acetaminophen at home. Ibuprofen po. reglan po.   Discussed imaging studies. Given chronic, recurrent headache for 1+ yr, recommend outpatient neurology for headache further evaluation and management.   Return precautions provided.       Final Clinical Impression(s) / ED Diagnoses Final diagnoses:  None    Rx / DC Orders ED Discharge Orders    None       Lajean Saver, MD 11/01/20 [phone]

## 2020-11-06 ENCOUNTER — Other Ambulatory Visit: Payer: Self-pay

## 2020-11-06 ENCOUNTER — Encounter: Payer: Self-pay | Admitting: Neurology

## 2020-11-06 ENCOUNTER — Ambulatory Visit (INDEPENDENT_AMBULATORY_CARE_PROVIDER_SITE_OTHER): Payer: Medicare Other | Admitting: Neurology

## 2020-11-06 VITALS — BP 118/62 | HR 67 | Ht 65.0 in | Wt 230.0 lb

## 2020-11-06 DIAGNOSIS — J323 Chronic sphenoidal sinusitis: Secondary | ICD-10-CM

## 2020-11-06 DIAGNOSIS — R519 Headache, unspecified: Secondary | ICD-10-CM

## 2020-11-06 DIAGNOSIS — E039 Hypothyroidism, unspecified: Secondary | ICD-10-CM | POA: Diagnosis not present

## 2020-11-06 DIAGNOSIS — J3489 Other specified disorders of nose and nasal sinuses: Secondary | ICD-10-CM

## 2020-11-06 NOTE — Patient Instructions (Signed)
It was nice to meet you today.  Even though you have a prior history of migraines, your current headache does not sound like a migraine.  Since it is in the nasal area and sinus areas, it is very possible that you suffer from chronic sinus issues of chronic sinusitis, your recent brain MRI as ordered by your primary care physician did show evidence of chronic inflammation, including findings that can be seen with sinusitis.  Please talk to your primary care physician, Dr. Philip Aspen about these results and a referral to ENT.  In addition, since you have woken up with a headache in the morning and at night, you may have underlying sleep apnea, your primary care physician is investigating this with a home sleep test, I would recommend that you follow-up on those 2 things and we will see you back in this clinic for a checkup in about 3 months to see how things are going.  I do not believe you need a daily migraine preventative.  Since her headaches are currently not severe and have responded to Advil, you can continue to use over-the-counter ibuprofen or Advil as needed.  Please try to hydrate well with water, 6 to 8 cups/day are recommended generally speaking, 8 ounce size each.

## 2020-11-06 NOTE — Progress Notes (Signed)
Subjective:    Patient ID: Kathryn Diaz is a 70 y.o. female.  HPI     Star Age, MD, PhD [facility] [address] [place], [place] [address]  I saw patient, Kathryn Diaz, as a referral from the ED for Evaluation of her recurrent headaches.  The patient is unaccompanied today.  She is a 70 year old right-handed woman with an underlying medical history of breast cancer with status post mastectomy and chemotherapy in the 80s, second-degree AV block, hypertension, hyperlipidemia, diabetes, hypothyroidism and obesity, who presented to the emergency room on 11/01/2020 with a history of recurrent headaches, described as dull, bifrontal, nearly daily, associated with occasional nausea, no neurological accompaniments, no vomiting, no photo or phonophobia.  I reviewed the emergency room records.  She was treated symptomatically with ibuprofen and metoclopramide.  She had previously undergone a brain MRI as ordered by her primary care physician, Dr. Philip Aspen.  She had a brain MRI with and without contrast on 10/25/2020 and I reviewed the results: IMPRESSION:  1. No evidence of acute intracranial abnormality or metastatic disease. 2. Chronic inflammatory left sphenoid sinus disease with T2 hypointensity in the sinus, which can be seen with fungal sinusitis.  She has not talked to her primary care about her MRI results yet.   Prior to that she had a head CT without contrast on 09/14/2017 and I reviewed the results:   IMPRESSION: Opacification left sphenoid sinus.   Otherwise negative unenhanced head CT.   She reports that her headache started about 3 to 4 months ago, earlier this year.  It is a constant, dull, pressure-like sensation in the middle of her face, sinus area, she points to her maxillary area but also nasal area, points to the area between her eyebrows and below both eyes.  Sometimes it feels like an eye pressure.  She sees her eye doctor  twice a year typically, had an eye exam on 09/07/2020 at [facility].  She sees her cardiologist once a year, recently had a checkup with Dr. Alvester Chou.  She does snore some, she is in the process of getting evaluated for sleep apnea through her primary care.  She reports that she will pick up home sleep test equipment this week.  She has not seen anybody for sinus issues, she has sinus drainage, it is clear, not thick or discolored.  She reports that she presented to the emergency room shortly after getting the Covid booster shot and pneumonia shot, she had exacerbation of her headache afterwards and it prompted her to go to the ER.  She denies any sudden onset of one-sided weakness or numbness or tingling or droopy face or slurring of speech.  Headaches are not throbbing, not associated with light sensitivity or sound sensitivity or nausea or vomiting.  She has a remote history of migraines, has not had any migraines in many years, headache is different from her previous migraines.  She tries to hydrate well, estimates that she drinks about 5 to 6 cups of water per day.  She drinks caffeine in the form of coffee, 1-1/2 cups in the mornings typically.  She does not smoke or drink alcohol.  She is divorced and lives alone.  She has woken up at night with a headache.    Her Past Medical History Is Significant For: Past Medical History:  Diagnosis Date  . Atrioventricular block, second degree    Mobitz Type 1  . Breast cancer ([place])   . Headache(784.0)   .  History of nuclear stress test    lexiscan; normal study   . Hypercholesteremia   . Hypertension   . Hypothyroidism   . Type II diabetes mellitus ([place])    "borderline"    Her Past Surgical History Is Significant For: Past Surgical History:  Procedure Laterality Date  . BREAST RECONSTRUCTION  1983  . COLONOSCOPY  2013  . LAPAROSCOPY  11/11/2011   Procedure: LAPAROSCOPY OPERATIVE;  Surgeon: Gus Height, MD;  Location: [facility];  Service: Gynecology;   Laterality: N/A;  . MASTECTOMY  1982   RIGHT BREAST   . REPAIR TENDONS FOOT  ~ 2000   right ankle  . RIGHT ANKLE REPAIR  1989  . SALPINGOOPHORECTOMY  11/11/2011   Procedure: SALPINGO OOPHERECTOMY;  Surgeon: Gus Height, MD;  Location: [facility];  Service: Gynecology;  Laterality: Bilateral;  . TRANSTHORACIC ECHOCARDIOGRAM  12/2011   EF 60-65%; mod conc LVH, abnormal LV relaxation (grade 1 diastolic dysfunction); mild central MR; RA mildly dilated; mild TR  . TUBAL LIGATION  1974    Her Family History Is Significant For: Family History  Problem Relation Age of Onset  . Clotting disorder Mother        blood clot  . Heart Problems Brother   . Kidney disease Brother   . Other Brother        cerebral hemorrhage  . Lung cancer Sister     Her Social History Is Significant For: Social History   Socioeconomic History  . Marital status: Divorced    Spouse name: Not on file  . Number of children: 5  . Years of education: Not on file  . Highest education level: Not on file  Occupational History  . 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 Smoking status: Never Smoker  . Smokeless tobacco: Never Used  Vaping Use  . Vaping Use: Never used  Substance and Sexual Activity  . Alcohol use: No  . Drug use: No  . Sexual activity: Never  Other Topics Concern  . Not on file  Social History Narrative  . Not on file   Social Determinants of Health   Financial Resource Strain: Not on file  Food Insecurity: Not on file  Transportation Needs: Not on file  Physical Activity: Not on file  Stress: Not on file  Social Connections: Not on file    Her Allergies Are:  No Known Allergies:   Her Current Medications Are:  Outpatient Encounter Medications as of 11/06/2020  Medication Sig  . allopurinol (ZYLOPRIM) 300 MG tablet Take 300 mg by mouth daily.  . carvedilol (COREG) 3.125 MG tablet Take 3.125 mg by mouth 2 (two) times daily with a meal.  .  levothyroxine (SYNTHROID, LEVOTHROID) 112 MCG tablet Take 112 mcg by mouth daily.  Marland Kitchen losartan (COZAAR) 100 MG tablet TAKE 1/2 (ONE-HALF) TABLET BY MOUTH ONCE DAILY  . meloxicam (MOBIC) 15 MG tablet TAKE 1 TABLET BY MOUTH ONCE DAILY AS NEEDED FOR PAIN FOR 90 DAYS  . metFORMIN (GLUCOPHAGE-XR) 500 MG 24 hr tablet Take 500 mg by mouth daily with breakfast.  . pantoprazole (PROTONIX) 40 MG tablet Take 1 tablet (40 mg total) by mouth daily.  . simvastatin (ZOCOR) 80 MG tablet Take 1 tablet by mouth daily.  Marland Kitchen triamterene-hydrochlorothiazide (DYAZIDE) 37.5-25 MG capsule Take 1 capsule by mouth daily.   No facility-administered encounter medications on file as of 11/06/2020.  :   Review of Systems:  Out of a complete 14 point review of systems, all are reviewed and negative with the exception of these symptoms as listed below:  Review of Systems  Neurological:       Pt reports here to discuss worsening h/a. Reports h/a have worsened over the last few months. MRI was completed and no abnormalities were seen. Typically will take Aleve and Ibuprofen. Pt reports she has been screened for OSA through PCP. HST is coming up later this week or next week.     Objective:  Neurological Exam  Physical Exam Physical Examination:   Vitals:   11/06/20 1039  BP: 118/62  Pulse: 67  SpO2: 98%    General Examination: The patient is a very pleasant 70 y.o. female in no acute distress. She appears well-developed and well-nourished and well groomed.   HEENT: Normocephalic, atraumatic, pupils are equal, round and reactive to light and accommodation. Extraocular tracking is good without limitation to gaze excursion or nystagmus noted. Normal smooth pursuit is noted. Hearing is grossly intact. Face is symmetric with normal facial animation and normal facial sensation. Speech is clear with no dysarthria noted. There is no hypophonia. There is no lip, neck/head, jaw or voice tremor. Neck is supple with full range of  passive and active motion. There are no carotid bruits on auscultation. Oropharynx exam reveals: mild mouth dryness, adequate dental hygiene and mild airway crowding. Tongue protrudes centrally and palate elevates symmetrically.   Chest: Clear to auscultation without wheezing, rhonchi or crackles noted.  Heart: S1+S2+0, regular and normal without murmurs, rubs or gallops noted.   Abdomen: Soft, non-tender and non-distended with normal bowel sounds appreciated on auscultation.  Extremities: There is no pitting edema in the distal lower extremities bilaterally.   Skin: Warm and dry without trophic changes noted.  Musculoskeletal: exam reveals no obvious joint deformities, tenderness or joint swelling or erythema.   Neurologically:  Mental status: The patient is awake, alert and oriented in all 4 spheres. Her immediate and remote memory, attention, language skills and fund of knowledge are appropriate. There is no evidence of aphasia, agnosia, apraxia or anomia. Speech is clear with normal prosody and enunciation. Thought process is linear. Mood is normal and affect is normal.  Cranial nerves II - XII are as described above under HEENT exam. In addition: shoulder shrug is normal with equal shoulder height noted. Motor exam: Normal bulk, strength and tone is noted. There is no drift, tremor or rebound. Romberg is negative. Reflexes are 1+ throughout. Babinski: Toes are flexor bilaterally. Fine motor skills and coordination: intact with normal finger taps, normal hand movements, normal rapid alternating patting, normal foot taps and normal foot agility.  Cerebellar testing: No dysmetria or intention tremor on finger to nose testing. Heel to shin is unremarkable bilaterally. There is no truncal or gait ataxia.  Sensory exam: intact to light touch, pinprick, vibration, temperature sense in the upper and lower extremities.  Gait, station and balance: She stands easily. No veering to one side is noted. No  leaning to one side is noted. Posture is age-appropriate and stance is narrow based. Gait shows normal stride length and normal pace. No problems turning are noted.                Assessment and Plan:   In summary, Kathryn Diaz is a very pleasant 70 y.o.-year old female with an underlying medical history of breast cancer with status post mastectomy and chemotherapy in the 80s, second-degree AV block, hypertension, hyperlipidemia, diabetes,  hypothyroidism and obesity, who presents for evaluation of her recurrent headaches of several months duration.  Her headache description is not classic for migraines, she reports a pressure sensation in her facial area, nasal area, sinus areas.  She has had some relief with as needed use of Advil.  She is advised to continue with this.  History is also supportive of a chronic sinus issue.  MRI findings would support chronic sinusitis as well.  She is encouraged to talk to her primary care physician about a referral to ENT for this.  In addition, she is being investigated for sleep apnea, underlying obstructive sleep apnea may be a contributor or cause for recurrent headaches as she has woken up at night with a headache as well.  Neurological exam is nonfocal and she is reassured in this regard.  We mutually agreed not to start any prescription medicine at this time.  She is advised to seek follow-up with her primary care physician to talk about a referral to ENT.  In addition, she is advised to follow-up with her primary care physician regarding her sleep apnea work-up.  She is scheduled for a home sleep test as I understand.  We will check in with her routinely in about 3 months.  She is advised regarding headache triggers.  She is advised to stay well-hydrated and well rested.  I answered all her questions today and she was in agreement with the plan.  Star Age, MD, PhD

## 2020-11-08 DIAGNOSIS — G4710 Hypersomnia, unspecified: Secondary | ICD-10-CM | POA: Diagnosis not present

## 2020-11-12 DIAGNOSIS — G4710 Hypersomnia, unspecified: Secondary | ICD-10-CM | POA: Diagnosis not present

## 2020-11-14 DIAGNOSIS — G4733 Obstructive sleep apnea (adult) (pediatric): Secondary | ICD-10-CM | POA: Diagnosis not present

## 2020-11-20 DIAGNOSIS — I10 Essential (primary) hypertension: Secondary | ICD-10-CM | POA: Diagnosis not present

## 2020-11-20 DIAGNOSIS — G4733 Obstructive sleep apnea (adult) (pediatric): Secondary | ICD-10-CM | POA: Diagnosis not present

## 2020-11-20 DIAGNOSIS — R519 Headache, unspecified: Secondary | ICD-10-CM | POA: Diagnosis not present

## 2020-11-20 DIAGNOSIS — E039 Hypothyroidism, unspecified: Secondary | ICD-10-CM | POA: Diagnosis not present

## 2020-11-20 DIAGNOSIS — E119 Type 2 diabetes mellitus without complications: Secondary | ICD-10-CM | POA: Diagnosis not present

## 2020-12-07 DIAGNOSIS — J323 Chronic sphenoidal sinusitis: Secondary | ICD-10-CM | POA: Diagnosis not present

## 2020-12-07 DIAGNOSIS — J310 Chronic rhinitis: Secondary | ICD-10-CM | POA: Diagnosis not present

## 2020-12-07 DIAGNOSIS — J343 Hypertrophy of nasal turbinates: Secondary | ICD-10-CM | POA: Diagnosis not present

## 2020-12-10 ENCOUNTER — Other Ambulatory Visit: Payer: Self-pay | Admitting: Otolaryngology

## 2020-12-10 DIAGNOSIS — J329 Chronic sinusitis, unspecified: Secondary | ICD-10-CM

## 2020-12-17 ENCOUNTER — Other Ambulatory Visit: Payer: Self-pay | Admitting: Otolaryngology

## 2020-12-22 ENCOUNTER — Ambulatory Visit
Admission: RE | Admit: 2020-12-22 | Discharge: 2020-12-22 | Disposition: A | Discharge status: Home / Self Care | Payer: Federal, State, Local not specified - PPO | Source: Ambulatory Visit | Attending: Otolaryngology | Admitting: Otolaryngology

## 2020-12-22 DIAGNOSIS — J3489 Other specified disorders of nose and nasal sinuses: Secondary | ICD-10-CM | POA: Diagnosis not present

## 2020-12-22 DIAGNOSIS — J329 Chronic sinusitis, unspecified: Secondary | ICD-10-CM

## 2020-12-27 ENCOUNTER — Encounter (HOSPITAL_BASED_OUTPATIENT_CLINIC_OR_DEPARTMENT_OTHER)
Admission: RE | Admit: 2020-12-27 | Discharge: 2020-12-27 | Disposition: A | Discharge status: Home / Self Care | Payer: Medicare Other | Source: Ambulatory Visit | Attending: Otolaryngology | Admitting: Otolaryngology

## 2020-12-27 ENCOUNTER — Encounter (HOSPITAL_BASED_OUTPATIENT_CLINIC_OR_DEPARTMENT_OTHER): Payer: Self-pay | Admitting: Otolaryngology

## 2020-12-27 ENCOUNTER — Other Ambulatory Visit: Payer: Self-pay

## 2020-12-27 DIAGNOSIS — Z01812 Encounter for preprocedural laboratory examination: Secondary | ICD-10-CM | POA: Diagnosis not present

## 2020-12-27 LAB — BASIC METABOLIC PANEL
Anion gap: 8 (ref 5–15)
BUN: 15 mg/dL (ref 8–23)
CO2: 26 mmol/L (ref 22–32)
Calcium: 10.1 mg/dL (ref 8.9–10.3)
Chloride: 107 mmol/L (ref 98–111)
Creatinine, Ser: 1.41 mg/dL — ABNORMAL HIGH (ref 0.44–1.00)
GFR, Estimated: 40 mL/min — ABNORMAL LOW (ref >60)
Glucose, Bld: 102 mg/dL — ABNORMAL HIGH (ref 70–99)
Potassium: 4.5 mmol/L (ref 3.5–5.1)
Sodium: 141 mmol/L (ref 135–145)

## 2021-01-02 DIAGNOSIS — H43812 Vitreous degeneration, left eye: Secondary | ICD-10-CM | POA: Diagnosis not present

## 2021-01-02 DIAGNOSIS — E119 Type 2 diabetes mellitus without complications: Secondary | ICD-10-CM | POA: Diagnosis not present

## 2021-01-07 ENCOUNTER — Other Ambulatory Visit: Payer: Self-pay

## 2021-01-07 ENCOUNTER — Ambulatory Visit (HOSPITAL_BASED_OUTPATIENT_CLINIC_OR_DEPARTMENT_OTHER): Payer: Medicare Other | Admitting: Anesthesiology

## 2021-01-07 ENCOUNTER — Ambulatory Visit (HOSPITAL_BASED_OUTPATIENT_CLINIC_OR_DEPARTMENT_OTHER)
Admission: RE | Admit: 2021-01-07 | Discharge: 2021-01-07 | Disposition: A | Discharge status: Home / Self Care | Payer: Medicare Other | Attending: Otolaryngology | Admitting: Otolaryngology

## 2021-01-07 ENCOUNTER — Encounter (HOSPITAL_BASED_OUTPATIENT_CLINIC_OR_DEPARTMENT_OTHER)
Admission: RE | Disposition: A | Discharge status: Home / Self Care | Payer: Self-pay | Source: Home / Self Care | Attending: Otolaryngology

## 2021-01-07 ENCOUNTER — Encounter (HOSPITAL_BASED_OUTPATIENT_CLINIC_OR_DEPARTMENT_OTHER): Payer: Self-pay | Admitting: Otolaryngology

## 2021-01-07 DIAGNOSIS — E119 Type 2 diabetes mellitus without complications: Secondary | ICD-10-CM | POA: Diagnosis not present

## 2021-01-07 DIAGNOSIS — E079 Disorder of thyroid, unspecified: Secondary | ICD-10-CM | POA: Diagnosis not present

## 2021-01-07 DIAGNOSIS — I10 Essential (primary) hypertension: Secondary | ICD-10-CM | POA: Insufficient documentation

## 2021-01-07 DIAGNOSIS — Z833 Family history of diabetes mellitus: Secondary | ICD-10-CM | POA: Diagnosis not present

## 2021-01-07 DIAGNOSIS — E7800 Pure hypercholesterolemia, unspecified: Secondary | ICD-10-CM | POA: Diagnosis not present

## 2021-01-07 DIAGNOSIS — J338 Other polyp of sinus: Secondary | ICD-10-CM | POA: Diagnosis not present

## 2021-01-07 DIAGNOSIS — Z853 Personal history of malignant neoplasm of breast: Secondary | ICD-10-CM | POA: Insufficient documentation

## 2021-01-07 DIAGNOSIS — J323 Chronic sphenoidal sinusitis: Secondary | ICD-10-CM | POA: Diagnosis not present

## 2021-01-07 DIAGNOSIS — B49 Unspecified mycosis: Secondary | ICD-10-CM | POA: Diagnosis not present

## 2021-01-07 DIAGNOSIS — J329 Chronic sinusitis, unspecified: Secondary | ICD-10-CM | POA: Diagnosis not present

## 2021-01-07 DIAGNOSIS — E039 Hypothyroidism, unspecified: Secondary | ICD-10-CM | POA: Diagnosis not present

## 2021-01-07 HISTORY — PX: SINUS ENDO WITH FUSION: SHX5329

## 2021-01-07 HISTORY — DX: Sleep apnea, unspecified: G47.30

## 2021-01-07 HISTORY — PX: SPHENOIDECTOMY: SHX2421

## 2021-01-07 LAB — GLUCOSE, CAPILLARY: Glucose-Capillary: 98 mg/dL (ref 70–99)

## 2021-01-07 SURGERY — SPHENOIDECTOMY
Anesthesia: General | Site: Nose | Laterality: Left

## 2021-01-07 MED ORDER — MIDAZOLAM HCL 2 MG/2ML IJ SOLN
INTRAMUSCULAR | Status: AC
  Filled 2021-01-07: qty 2

## 2021-01-07 MED ORDER — LACTATED RINGERS IV SOLN: INTRAVENOUS | Status: DC

## 2021-01-07 MED ORDER — ROCURONIUM BROMIDE 100 MG/10ML IV SOLN
INTRAVENOUS | Status: DC | PRN
  Administered 2021-01-07: 50 mg via INTRAVENOUS

## 2021-01-07 MED ORDER — MIDAZOLAM HCL 5 MG/5ML IJ SOLN
INTRAMUSCULAR | Status: DC | PRN
  Administered 2021-01-07: 1 mg via INTRAVENOUS

## 2021-01-07 MED ORDER — FENTANYL CITRATE (PF) 100 MCG/2ML IJ SOLN
INTRAMUSCULAR | Status: AC
  Filled 2021-01-07: qty 2

## 2021-01-07 MED ORDER — FENTANYL CITRATE (PF) 100 MCG/2ML IJ SOLN: 25.0000 ug | INTRAMUSCULAR | Status: DC | PRN

## 2021-01-07 MED ORDER — MUPIROCIN 2 % EX OINT
TOPICAL_OINTMENT | CUTANEOUS | Status: DC | PRN
  Administered 2021-01-07: 1 via TOPICAL

## 2021-01-07 MED ORDER — SUGAMMADEX SODIUM 200 MG/2ML IV SOLN
INTRAVENOUS | Status: DC | PRN
  Administered 2021-01-07: 200 mg via INTRAVENOUS

## 2021-01-07 MED ORDER — LIDOCAINE HCL (CARDIAC) PF 100 MG/5ML IV SOSY
PREFILLED_SYRINGE | INTRAVENOUS | Status: DC | PRN
  Administered 2021-01-07: 100 mg via INTRAVENOUS

## 2021-01-07 MED ORDER — FENTANYL CITRATE (PF) 100 MCG/2ML IJ SOLN
INTRAMUSCULAR | Status: DC | PRN
  Administered 2021-01-07 (×2): 50 ug via INTRAVENOUS

## 2021-01-07 MED ORDER — OXYMETAZOLINE HCL 0.05 % NA SOLN
NASAL | Status: DC | PRN
  Administered 2021-01-07: 1 via TOPICAL

## 2021-01-07 MED ORDER — PROPOFOL 10 MG/ML IV BOLUS
INTRAVENOUS | Status: DC | PRN
  Administered 2021-01-07: 200 mg via INTRAVENOUS

## 2021-01-07 MED ORDER — EPHEDRINE SULFATE 50 MG/ML IJ SOLN
INTRAMUSCULAR | Status: DC | PRN
  Administered 2021-01-07: 15 mg via INTRAVENOUS

## 2021-01-07 MED ORDER — CEFAZOLIN SODIUM-DEXTROSE 2-3 GM-%(50ML) IV SOLR
INTRAVENOUS | Status: DC | PRN
  Administered 2021-01-07: 2 g via INTRAVENOUS

## 2021-01-07 MED ORDER — ONDANSETRON HCL 4 MG/2ML IJ SOLN
INTRAMUSCULAR | Status: DC | PRN
  Administered 2021-01-07: 4 mg via INTRAVENOUS

## 2021-01-07 MED ORDER — DEXAMETHASONE SODIUM PHOSPHATE 4 MG/ML IJ SOLN
INTRAMUSCULAR | Status: DC | PRN
  Administered 2021-01-07: 10 mg via INTRAVENOUS

## 2021-01-07 MED ORDER — AMOXICILLIN 875 MG PO TABS: 875.0000 mg | ORAL_TABLET | Freq: Two times a day (BID) | ORAL | 0 refills | Status: AC

## 2021-01-07 SURGICAL SUPPLY — 56 items
BLADE RAD40 ROTATE 4M 4 5PK (BLADE) IMPLANT
BLADE RAD40 ROTATE 4M 4MM 5PK (BLADE)
BLADE RAD60 ROTATE M4 4 5PK (BLADE) IMPLANT
BLADE RAD60 ROTATE M4 4MM 5PK (BLADE)
BLADE ROTATE RAD 12 4 M4 (BLADE) IMPLANT
BLADE ROTATE RAD 12 4MM M4 (BLADE)
BLADE ROTATE RAD 40 4 M4 (BLADE) IMPLANT
BLADE ROTATE RAD 40 4MM M4 (BLADE)
BLADE ROTATE TRICUT 4MX13CM M4 (BLADE) ×1
BLADE ROTATE TRICUT 4X13 M4 (BLADE) ×2 IMPLANT
BLADE TRICUT ROTATE M4 4 5PK (BLADE) IMPLANT
BLADE TRICUT ROTATE M4 4MM 5PK (BLADE)
BUR HS RAD FRONTAL 3 (BURR) IMPLANT
BUR HS RAD FRONTAL 3MM (BURR)
CANISTER SUC SOCK COL 7IN (MISCELLANEOUS) ×3 IMPLANT
CANISTER SUCT 1200ML W/VALVE (MISCELLANEOUS) ×6 IMPLANT
COAGULATOR SUCT 8FR VV (MISCELLANEOUS) IMPLANT
COVER WAND RF STERILE (DRAPES) IMPLANT
DECANTER SPIKE VIAL GLASS SM (MISCELLANEOUS) IMPLANT
DRSG NASAL KENNEDY LMNT 8CM (GAUZE/BANDAGES/DRESSINGS) IMPLANT
DRSG NASOPORE 8CM (GAUZE/BANDAGES/DRESSINGS) ×3 IMPLANT
ELECT REM PT RETURN 9FT ADLT (ELECTROSURGICAL)
ELECTRODE REM PT RTRN 9FT ADLT (ELECTROSURGICAL) IMPLANT
GLOVE SURG ENC MOIS LTX SZ6.5 (GLOVE) ×3 IMPLANT
GLOVE SURG ENC MOIS LTX SZ7.5 (GLOVE) ×3 IMPLANT
GLOVE SURG UNDER POLY LF SZ7 (GLOVE) ×3 IMPLANT
GOWN STRL REUS W/ TWL LRG LVL3 (GOWN DISPOSABLE) ×2 IMPLANT
GOWN STRL REUS W/TWL LRG LVL3 (GOWN DISPOSABLE) ×6
HEMOSTAT SURGICEL 2X14 (HEMOSTASIS) IMPLANT
IV NS 500ML (IV SOLUTION) ×3
IV NS 500ML BAXH (IV SOLUTION) ×1 IMPLANT
NEEDLE HYPO 25X1 1.5 SAFETY (NEEDLE) IMPLANT
NEEDLE SPNL 25GX3.5 QUINCKE BL (NEEDLE) IMPLANT
NS IRRIG 1000ML POUR BTL (IV SOLUTION) ×3 IMPLANT
PACK BASIN DAY SURGERY FS (CUSTOM PROCEDURE TRAY) ×3 IMPLANT
PACK ENT DAY SURGERY (CUSTOM PROCEDURE TRAY) ×3 IMPLANT
SLEEVE SCD COMPRESS KNEE MED (STOCKING) ×3 IMPLANT
SOLUTION BUTLER CLEAR DIP (MISCELLANEOUS) ×3 IMPLANT
SPLINT NASAL AIRWAY SILICONE (MISCELLANEOUS) IMPLANT
SPONGE GAUZE 2X2 8PLY STER LF (GAUZE/BANDAGES/DRESSINGS) ×1
SPONGE GAUZE 2X2 8PLY STRL LF (GAUZE/BANDAGES/DRESSINGS) ×2 IMPLANT
SPONGE NEURO XRAY DETECT 1X3 (DISPOSABLE) ×3 IMPLANT
SUCTION FRAZIER HANDLE 10FR (MISCELLANEOUS)
SUCTION TUBE FRAZIER 10FR DISP (MISCELLANEOUS) IMPLANT
SUT CHROMIC 4 0 P 3 18 (SUTURE) IMPLANT
SUT PLAIN 4 0 ~~LOC~~ 1 (SUTURE) IMPLANT
SUT PROLENE 3 0 PS 2 (SUTURE) IMPLANT
SYR 50ML LL SCALE MARK (SYRINGE) ×3 IMPLANT
TOWEL GREEN STERILE FF (TOWEL DISPOSABLE) ×3 IMPLANT
TRACKER ENT INSTRUMENT (MISCELLANEOUS) ×3 IMPLANT
TRACKER ENT PATIENT (MISCELLANEOUS) ×3 IMPLANT
TUBE CONNECTING 20'X1/4 (TUBING) ×2
TUBE CONNECTING 20X1/4 (TUBING) ×4 IMPLANT
TUBE SALEM SUMP 16 FR W/ARV (TUBING) ×3 IMPLANT
TUBING STRAIGHTSHOT EPS 5PK (TUBING) ×3 IMPLANT
YANKAUER SUCT BULB TIP NO VENT (SUCTIONS) ×3 IMPLANT

## 2021-01-07 NOTE — Op Note (Signed)
DATE OF PROCEDURE: 01/07/2021  OPERATIVE REPORT   SURGEON: Leta Baptist, MD   PREOPERATIVE DIAGNOSES:  1. Chronic left sphenoid sinusitis  POSTOPERATIVE DIAGNOSES:  1. Chronic left sphenoid sinusitis  PROCEDURE PERFORMED:  1. Endoscopic left sphenoidotomy with removal of fungas ball 2. FUSION stereotactic image guidance  ANESTHESIA: General endotracheal tube anesthesia.   COMPLICATIONS: None.   ESTIMATED BLOOD LOSS: 50 mL.   INDICATION FOR PROCEDURE: Kathryn Diaz is a 70 y.o. female with a history of chronic left sphenoid sinusitis and headaches.  She was evaluated with a head CT scan and MRI.  She was found to have significant opacification of her left sphenoid sinus with features consistent with fungal sinusitis.  The patient was previously treated with allergy medication and antibiotics without improvement in her symptoms.  She continues to have headaches and pain around her eyes and behind her nose.  Based on the above findings, the decision was made for the patient to undergo the above-stated procedures. The risks, benefits, alternatives, and details of the procedures were discussed with the patient. Questions were invited and answered. Informed consent was obtained.   DESCRIPTION OF PROCEDURE: The patient was taken to the operating room and placed supine on the operating table. General endotracheal tube anesthesia was administered by the anesthesiologist. The patient was positioned, and prepped and draped in the standard fashion for nasal surgery. Pledgets soaked with Afrin were placed in both nasal cavities for decongestion. The pledgets were subsequently removed. The FUSION stereotactic image guidance marker was placed. The image guidance system was functional throughout the case.  Using a 0 endoscope, the left nasal cavity was examined. The sphenoid opening was identified and enlarged. A large amount of fungal debris was noted within the left sphenoid cavity. The fungal debris was  removed with a combination of suction catheters. The left sphenoid sinus was copiously irrigated with saline solution.  The care of the patient was turned over to the anesthesiologist. The patient was awakened from anesthesia without difficulty. The patient was extubated and transferred to the recovery room in good condition.   OPERATIVE FINDINGS: Left sphenoid chronic fungal sinusitis.  SPECIMEN: Left sphenoid sinus contents.  FOLLOWUP CARE: The patient be discharged home once she is awake and alert. The patient will be placed on amoxicillin 875 mg p.o. b.i.d. for 3 days. The patient will follow up in my office in 1 week.  Kathryn Sporn Raynelle Bring, MD

## 2021-01-07 NOTE — Anesthesia Preprocedure Evaluation (Signed)
Anesthesia Evaluation  Patient identified by MRN, date of birth, ID band Patient awake    Reviewed: Allergy & Precautions, NPO status , Patient's Chart, lab work & pertinent test results  Airway Mallampati: II  TM Distance: >3 FB     Dental   Pulmonary sleep apnea ,    breath sounds clear to auscultation       Cardiovascular hypertension, + dysrhythmias  Rhythm:Regular Rate:Normal     Neuro/Psych  Headaches,    GI/Hepatic negative GI ROS, Neg liver ROS,   Endo/Other  diabetesHypothyroidism   Renal/GU negative Renal ROS     Musculoskeletal   Abdominal   Peds  Hematology   Anesthesia Other Findings   Reproductive/Obstetrics                             Anesthesia Physical Anesthesia Plan  ASA: 3  Anesthesia Plan: General   Post-op Pain Management:    Induction: Intravenous  PONV Risk Score and Plan: 3 and Ondansetron, Dexamethasone and Midazolam  Airway Management Planned: Oral ETT  Additional Equipment:   Intra-op Plan:   Post-operative Plan: Extubation in OR  Informed Consent: I have reviewed the patients History and Physical, chart, labs and discussed the procedure including the risks, benefits and alternatives for the proposed anesthesia with the patient or authorized representative who has indicated his/her understanding and acceptance.     Dental advisory given  Plan Discussed with: CRNA and Anesthesiologist  Anesthesia Plan Comments:         Anesthesia Quick Evaluation

## 2021-01-07 NOTE — Transfer of Care (Signed)
Immediate Anesthesia Transfer of Care Note  Patient: Kathryn Diaz  Procedure(s) Performed: LEFT ENDOSCOPIC  SPHENOIDECTOMY WITH TISSUE REMOVAL (Left: Nose) SINUS ENDOSCOPY WITH FUSION NAVIGATION (Left: Nose)  Patient Location: PACU  Anesthesia Type:General  Level of Consciousness: awake, alert  and oriented  Airway & Oxygen Therapy: Patient Spontanous Breathing and Patient connected to face mask oxygen  Post-op Assessment: Report given to RN and Post -op Vital signs reviewed and stable  Post vital signs: Reviewed and stable  Last Vitals:  Vitals Value Taken Time  BP    Temp    Pulse 81 01/07/21 1227  Resp    SpO2 100 % 01/07/21 1227  Vitals shown include unvalidated device data.  Last Pain:  Vitals:   01/07/21 1004  TempSrc: Oral  PainSc: 0-No pain         Complications: No notable events documented.

## 2021-01-07 NOTE — Anesthesia Procedure Notes (Signed)
Procedure Name: Intubation Date/Time: 01/07/2021 11:50 AM Performed by: Verita Lamb, CRNA Pre-anesthesia Checklist: Patient identified, Emergency Drugs available, Suction available and Patient being monitored Patient Re-evaluated:Patient Re-evaluated prior to induction Oxygen Delivery Method: Circle system utilized Preoxygenation: Pre-oxygenation with 100% oxygen Induction Type: IV induction Ventilation: Mask ventilation without difficulty Laryngoscope Size: Mac and 3 Grade View: Grade I Tube type: Oral Tube size: 7.0 mm Number of attempts: 1 Airway Equipment and Method: Stylet and Oral airway Placement Confirmation: ETT inserted through vocal cords under direct vision, positive ETCO2, breath sounds checked- equal and bilateral and CO2 detector Tube secured with: Tape Dental Injury: Teeth and Oropharynx as per pre-operative assessment  Comments: Grade I view, glottic opening appeared very red and swollen.  Intubation easy and atraumatic.  Made Dr. Benjamine Mola aware of how the patients airway looked.

## 2021-01-07 NOTE — Discharge Instructions (Addendum)

## 2021-01-07 NOTE — Anesthesia Postprocedure Evaluation (Signed)
Anesthesia Post Note  Patient: Kathryn Diaz  Procedure(s) Performed: LEFT ENDOSCOPIC  SPHENOIDECTOMY WITH TISSUE REMOVAL (Left: Nose) SINUS ENDOSCOPY WITH FUSION NAVIGATION (Left: Nose)     Patient location during evaluation: PACU Anesthesia Type: General Level of consciousness: awake Pain management: pain level controlled Vital Signs Assessment: post-procedure vital signs reviewed and stable Respiratory status: spontaneous breathing Cardiovascular status: stable Postop Assessment: no apparent nausea or vomiting Anesthetic complications: no   No notable events documented.  Last Vitals:  Vitals:   01/07/21 1230 01/07/21 1245  BP: (!) 122/108   Pulse: 81 74  Resp: 16 11  Temp:    SpO2: 100% 93%    Last Pain:  Vitals:   01/07/21 1230  TempSrc:   PainSc: 0-No pain                 Dain Laseter

## 2021-01-07 NOTE — H&P (Signed)
Cc: Chronic sphenoid sinusitis  HPI: The patient is a 70 year old female who presents today for evaluation of her chronic sphenoid sinusitis.  The patient has a history of chronic headaches.  She was evaluated with a head CT scan and MRI.  She was found to have significant opacification of her left sphenoid sinus with features consistent with fungal sinusitis.  The patient was previously treated with allergy medication and antibiotics without improvement in her symptoms.  She continues to have headaches and pain around her eyes and behind her nose.  She has been symptomatic since February 2022.  She has no previous history of ENT surgery.   The patient's review of systems (constitutional, eyes, ENT, cardiovascular, respiratory, GI, musculoskeletal, skin, neurologic, psychiatric, endocrine, hematologic, allergic) is noted in the ROS questionnaire.  It is reviewed with the patient.  Major events: Breast cancer,  right mastectomy.  Ongoing medical problems: Hypertension, breast cancer, headaches, diabetes, thyroid disease.  Family health history: Diabetes .  Social history: The patient is single. She denies the use of tobacco, alcohol or illegal drugs.    Exam: General: Communicates without difficulty, well nourished, no acute distress. Head: Normocephalic, no evidence injury, no tenderness, facial buttresses intact without stepoff. Face/sinus: No tenderness to palpation and percussion. Facial movement is normal and symmetric. Eyes: PERRL, EOMI. No scleral icterus, conjunctivae clear. Neuro: CN II exam reveals vision grossly intact.  No nystagmus at any point of gaze. Ears: Auricles well formed without lesions.  Ear canals are intact without mass or lesion.  No erythema or edema is appreciated.  The TMs are intact without fluid. Nose: External evaluation reveals normal support and skin without lesions.  Dorsum is intact.  Anterior rhinoscopy reveals congested mucosa over anterior aspect of inferior  turbinates and intact septum.  No purulence noted. Oral:  Oral cavity and oropharynx are intact, symmetric, without erythema or edema.  Mucosa is moist without lesions. Neck: Full range of motion without pain.  There is no significant lymphadenopathy.  No masses palpable.  Thyroid bed within normal limits to palpation.  Parotid glands and submandibular glands equal bilaterally without mass.  Trachea is midline. Neuro:  CN 2-12 grossly intact. Gait normal. A flexible scope was inserted into the right nasal cavity.  Endoscopy of the interior nasal cavity, superior, inferior, and middle meatus was performed. The sphenoid-ethmoid recess was examined. Edematous mucosa was noted.  No polyp, mass, or lesion was appreciated. Olfactory cleft was clear.  Nasopharynx was clear.  Turbinates were hypertrophied but without mass.  The procedure was repeated on the contralateral side. The sphenoid opening around the left sphenoid sinus was noted to be severely edematous.  The patient tolerated the procedure well.   Assessment  1.  Chronic left sphenoid fungal sinusitis.  This has caused chronic headaches and periorbital pain.   2.  The patient is also noted to have chronic rhinitis with nasal mucosal congestion and bilateral inferior turbinate hypertrophy.  The sphenoid opening around the left sphenoid sinus is noted to be severely edematous.   Plan  1.  The physical exam and nasal endoscopy findings are reviewed with the patient.  2.  Based on the above findings, the patient will benefit from undergoing left sphenoidectomy with evacuation of the fungal infection.  The risks, benefits, alternatives and details of the procedures are reviewed with the patient.  3.  The patient would like to proceed with the procedure.

## 2021-01-08 ENCOUNTER — Encounter (HOSPITAL_BASED_OUTPATIENT_CLINIC_OR_DEPARTMENT_OTHER): Payer: Self-pay | Admitting: Otolaryngology

## 2021-01-08 LAB — SURGICAL PATHOLOGY

## 2021-01-11 ENCOUNTER — Other Ambulatory Visit: Payer: Self-pay | Admitting: Cardiovascular Disease

## 2021-01-14 DIAGNOSIS — J323 Chronic sphenoidal sinusitis: Secondary | ICD-10-CM | POA: Diagnosis not present

## 2021-01-14 DIAGNOSIS — J338 Other polyp of sinus: Secondary | ICD-10-CM | POA: Diagnosis not present

## 2021-01-21 DIAGNOSIS — E039 Hypothyroidism, unspecified: Secondary | ICD-10-CM | POA: Diagnosis not present

## 2021-01-21 DIAGNOSIS — R739 Hyperglycemia, unspecified: Secondary | ICD-10-CM | POA: Diagnosis not present

## 2021-01-22 DIAGNOSIS — G4733 Obstructive sleep apnea (adult) (pediatric): Secondary | ICD-10-CM | POA: Diagnosis not present

## 2021-02-04 DIAGNOSIS — H6123 Impacted cerumen, bilateral: Secondary | ICD-10-CM | POA: Diagnosis not present

## 2021-02-04 DIAGNOSIS — J323 Chronic sphenoidal sinusitis: Secondary | ICD-10-CM | POA: Diagnosis not present

## 2021-02-04 DIAGNOSIS — J338 Other polyp of sinus: Secondary | ICD-10-CM | POA: Diagnosis not present

## 2021-02-05 ENCOUNTER — Encounter: Payer: Self-pay | Admitting: Neurology

## 2021-02-05 ENCOUNTER — Ambulatory Visit (INDEPENDENT_AMBULATORY_CARE_PROVIDER_SITE_OTHER): Payer: Medicare Other | Admitting: Neurology

## 2021-02-05 VITALS — BP 114/70 | HR 65 | Ht 65.0 in | Wt 229.4 lb

## 2021-02-05 DIAGNOSIS — G4733 Obstructive sleep apnea (adult) (pediatric): Secondary | ICD-10-CM

## 2021-02-05 DIAGNOSIS — J3489 Other specified disorders of nose and nasal sinuses: Secondary | ICD-10-CM | POA: Diagnosis not present

## 2021-02-05 DIAGNOSIS — R519 Headache, unspecified: Secondary | ICD-10-CM | POA: Diagnosis not present

## 2021-02-05 NOTE — Patient Instructions (Signed)
It was nice to see you again today.  I am glad to hear that you have recuperated well from your sinus surgery about a month ago.  I am happy to hear that your headaches are little bit better.  With ongoing optimization of your sleep apnea treatment, I hope that your headaches will improve even further.  At this juncture, I recommend that you follow-up with your sleep specialist, your primary care physician and ENT as scheduled.  We can see you back in this clinic as needed.

## 2021-02-05 NOTE — Progress Notes (Signed)
Subjective:    Patient ID: Kathryn Diaz is a 70 y.o. female.  HPI    Interim history:  Kathryn Diaz is a 70 year old right-handed woman with an underlying medical history of breast cancer with status post mastectomy and chemotherapy in the 80s, second-degree AV block, hypertension, hyperlipidemia, diabetes, hypothyroidism and obesity, who presents for follow-up consultation of her recurrent headaches.  The patient is unaccompanied today.  I first met her as a referral from the emergency room for recurrent headaches which she described as a pressure sensation in the forehead and between the eyebrows as well as the nasal area.  A recent brain MRI had shown left sphenoid sinus disease.  She was advised to consult with ENT.  She was also in the process of being evaluated for sleep apnea through her primary care.  Today, 02/05/2021: She reports doing little better.  She had left sphenoidal surgery on 01/07/2021 under Dr. Benjamine Mola.  She had a follow-up yesterday and was told that she is healing well.  She has another follow-up pending for about 3 weeks from now.  She had interim evaluation for sleep apnea.  She was told that she had 25 events, I am suspecting per hour, in the moderate range.  She was fitted with an oral appliance.  [facility]  She reports that she recently had a repeat home sleep test and her events were down to 10.  They are working on further reducing her events to single digits.  She has a follow-up appointment pending.  She reports that her headaches are better.  She has not taken Tylenol or ibuprofen on a regular basis.  She avoids taking Mobic.  She has had interim left hip discomfort and saw orthopedics.  She has not fallen.  She has been trying to hydrate well.  She has no new complaints.  She had a recent eye checkup.    The patient's allergies, current medications, family history, past medical history, past social history, past surgical history and  problem list were reviewed and updated as appropriate.   Previously:   11/06/20: (She) presented to the emergency room on 11/01/2020 with a history of recurrent headaches, described as dull, bifrontal, nearly daily, associated with occasional nausea, no neurological accompaniments, no vomiting, no photo or phonophobia.  I reviewed the emergency room records.  She was treated symptomatically with ibuprofen and metoclopramide.  She had previously undergone a brain MRI as ordered by her primary care physician, Dr. Philip Aspen.  She had a brain MRI with and without contrast on 10/25/2020 and I reviewed the results: IMPRESSION:   1. No evidence of acute intracranial abnormality or metastatic disease. 2. Chronic inflammatory left sphenoid sinus disease with T2 hypointensity in the sinus, which can be seen with fungal sinusitis.   She has not talked to her primary care about her MRI results yet.   Prior to that she had a head CT without contrast on 09/14/2017 and I reviewed the results:   IMPRESSION: Opacification left sphenoid sinus.   Otherwise negative unenhanced head CT.   She reports that her headache started about 3 to 4 months ago, earlier this year.  It is a constant, dull, pressure-like sensation in the middle of her face, sinus area, she points to her maxillary area but also nasal area, points to the area between her eyebrows and below both eyes.  Sometimes it feels like an eye pressure.  She sees her eye doctor twice a year typically,  had an eye exam on 09/07/2020 at [place].  She sees her cardiologist once a year, recently had a checkup with Dr. Alvester Chou.  She does snore some, she is in the process of getting evaluated for sleep apnea through her primary care.  She reports that she will pick up home sleep test equipment this week.  She has not seen anybody for sinus issues, she has sinus drainage, it is clear, not thick or discolored.  She reports that she presented to the emergency room shortly after  getting the Covid booster shot and pneumonia shot, she had exacerbation of her headache afterwards and it prompted her to go to the ER.  She denies any sudden onset of one-sided weakness or numbness or tingling or droopy face or slurring of speech.  Headaches are not throbbing, not associated with light sensitivity or sound sensitivity or nausea or vomiting.  She has a remote history of migraines, has not had any migraines in many years, headache is different from her previous migraines.  She tries to hydrate well, estimates that she drinks about 5 to 6 cups of water per day.  She drinks caffeine in the form of coffee, 1-1/2 cups in the mornings typically.  She does not smoke or drink alcohol.  She is divorced and lives alone. She has woken up at night with a headache.    Her Past Medical History Is Significant For: Past Medical History:  Diagnosis Date   Atrioventricular block, second degree    Mobitz Type 1   Breast cancer ([place])    Headache(784.0)    History of nuclear stress test    lexiscan; normal study    Hypercholesteremia    Hypertension    Hypothyroidism    Sleep apnea    mouth piece no CPAP   Type II diabetes mellitus ([place])    "borderline"    Her Past Surgical History Is Significant For: Past Surgical History:  Procedure Laterality Date   ABDOMINAL HYSTERECTOMY     partial   BREAST RECONSTRUCTION  1983   COLONOSCOPY  2013   LAPAROSCOPY  11/11/2011   Procedure: LAPAROSCOPY OPERATIVE;  Surgeon: Gus Height, MD;  Location: [facility];  Service: Gynecology;  Laterality: N/A;   MASTECTOMY  1982   RIGHT BREAST    REPAIR TENDONS FOOT  ~ 2000   right ankle   RIGHT ANKLE REPAIR  1989   SALPINGOOPHORECTOMY  11/11/2011   Procedure: SALPINGO OOPHERECTOMY;  Surgeon: Gus Height, MD;  Location: [facility];  Service: Gynecology;  Laterality: Bilateral;   SINUS ENDO WITH FUSION Left 01/07/2021   Procedure: SINUS ENDOSCOPY WITH FUSION NAVIGATION;  Surgeon: Leta Baptist, MD;  Location: [facility];  Service: ENT;  Laterality: Left;   SPHENOIDECTOMY Left 01/07/2021   Procedure: LEFT ENDOSCOPIC  SPHENOIDECTOMY WITH TISSUE REMOVAL;  Surgeon: Leta Baptist, MD;  Location: [facility];  Service: ENT;  Laterality: Left;   TRANSTHORACIC ECHOCARDIOGRAM  12/2011   EF 60-65%; mod conc LVH, abnormal LV relaxation (grade 1 diastolic dysfunction); mild central MR; RA mildly dilated; mild TR   TUBAL LIGATION  1974    Her Family History Is Significant For: Family History  Problem Relation Age of Onset   Clotting disorder Mother        blood clot   Heart Problems Brother    Kidney disease Brother    Other Brother        cerebral hemorrhage   Lung cancer Sister     Her  Social History Is Significant For: Social History   Socioeconomic History   Marital status: Divorced    Spouse name: Not on file   Number of children: 5   Years of education: Not on file   Highest education level: Not on file  Occupational History   Occupation: Engineer, 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[place] [employer]   Tobacco Use   Smoking status: Never   Smokeless tobacco: Never  Vaping Use   Vaping Use: Never used  Substance and Sexual Activity   Alcohol use: No   Drug use: No   Sexual activity: Never  Other Topics Concern   Not on file  Social History Narrative   Not on file   Social Determinants of Health   Financial Resource Strain: Not on file  Food Insecurity: Not on file  Transportation Needs: Not on file  Physical Activity: Not on file  Stress: Not on file  Social Connections: Not on file    Her Allergies Are:  No Known Allergies:   Her Current Medications Are:  Outpatient Encounter Medications as of 02/05/2021  Medication Sig   allopurinol (ZYLOPRIM) 300 MG tablet Take 300 mg by mouth daily.   carvedilol (COREG) 3.125 MG tablet Take 3.125 mg by mouth 2 (two) times daily with a meal.   levothyroxine (SYNTHROID, LEVOTHROID) 112 MCG tablet Take 112 mcg by mouth daily.    losartan (COZAAR) 100 MG tablet TAKE 1/2 (ONE-HALF) TABLET BY MOUTH ONCE DAILY   meloxicam (MOBIC) 15 MG tablet TAKE 1 TABLET BY MOUTH ONCE DAILY AS NEEDED FOR PAIN FOR 90 DAYS   metFORMIN (GLUCOPHAGE-XR) 500 MG 24 hr tablet Take 500 mg by mouth daily with breakfast.   pantoprazole (PROTONIX) 40 MG tablet Take 1 tablet by mouth once daily   simvastatin (ZOCOR) 80 MG tablet Take 1 tablet by mouth daily.   triamterene-hydrochlorothiazide (DYAZIDE) 37.5-25 MG capsule Take 1 capsule by mouth daily.   No facility-administered encounter medications on file as of 02/05/2021.  :  Review of Systems:  Out of a complete 14 point review of systems, all are reviewed and negative with the exception of these symptoms as listed below:  Review of Systems  Neurological:        Pt presents today for a 3 month f/u. Pt was seen by ENT, on 6/13 pt had left endoscopic sphenoidostomy w/ tissue removal. Pt still continues to have pn. Will be seeing her eye doctor on July 25th.    Objective:  Neurological Exam  Physical Exam Physical Examination:   Vitals:   02/05/21 1040  BP: 114/70  Pulse: 65    General Examination: The patient is a very pleasant 70 y.o. female in no acute distress. She appears well-developed and well-nourished and well groomed.   HEENT: Normocephalic, atraumatic, pupils are equal, round and reactive to light, extraocular tracking is good without limitation to gaze excursion or nystagmus noted. Normal smooth pursuit is noted. Hearing is grossly intact. Face is symmetric with normal facial animation and normal facial sensation. Speech is clear with no dysarthria noted. There is no hypophonia. There is no lip, neck/head, jaw or voice tremor. Neck is supple with full range of passive and active motion. There are no carotid bruits on auscultation. Oropharynx exam reveals: mild mouth dryness, adequate dental hygiene and mild airway crowding. Tongue protrudes centrally and palate elevates  symmetrically.   Chest: Clear to auscultation without wheezing, rhonchi or crackles noted.   Heart: S1+S2+0, regular and normal  without murmurs, rubs or gallops noted.   Abdomen: Soft, non-tender and non-distended.   Extremities: There is no pitting edema in the distal lower extremities bilaterally.   Skin: Warm and dry without trophic changes noted.   Musculoskeletal: exam reveals no obvious joint deformities.  Mild discomfort in the left hip with slight reduction in range of motion noted.   Neurologically: Mental status: The patient is awake, alert and oriented in all 4 spheres. Her immediate and remote memory, attention, language skills and fund of knowledge are appropriate. There is no evidence of aphasia, agnosia, apraxia or anomia. Speech is clear with normal prosody and enunciation. Thought process is linear. Mood is normal and affect is normal. Cranial nerves II - XII are as described above under HEENT exam. In addition: shoulder shrug is normal with equal shoulder height noted. Motor exam: Normal bulk, strength and tone is noted. There is no drift, tremor or rebound. Reflexes are 1+ throughout. Fine motor skills and coordination: intact with normal finger taps, normal hand movements, normal rapid alternating patting, normal foot taps and normal foot agility. Cerebellar testing: No dysmetria or intention tremor on finger to nose testing. Heel to shin is unremarkable bilaterally, slight reduction in range of motion in the left hip. There is no truncal or gait ataxia. Sensory exam: intact to light touch. Gait, station and balance: She stands easily. No veering to one side is noted. No leaning to one side is noted. Posture is age-appropriate and stance is narrow based. Gait shows normal stride length and normal pace. No problems turning are noted.  No limp.                Assessment and Plan:    In summary, SHANTARA GOOSBY is a very pleasant 70 year old female with an underlying medical  history of breast cancer with status post mastectomy and chemotherapy in the 80s, second-degree AV block, hypertension, hyperlipidemia, diabetes, hypothyroidism and obesity, who presents for follow-up consultation of her recurrent headaches.  She has had interim left sphenoid sinus surgery about a month ago and has recuperated well.  Headaches have improved in fact.  She completed antibiotic treatment as well.  She has also been diagnosed with what sounds like moderate obstructive sleep apnea in the interim and has been followed by [facility].  She has been given a dental device which she has been wearing nightly.  She reports that her events have reduced from about 25/h to 10/h.  She has a follow-up appointment pending.  Exam is stable.  Previous brain MRI findings supported chronic sinusitis.  Her neurological exam is nonfocal and she is reassured in this regard.  We mutually agreed not to start any prescription medicine at this time, in fact, thankfully, her headaches have improved.  We talked about the importance of healthy lifestyle, good hydration with water, getting enough rest.  At this juncture, she is advised to follow-up with her ENT, her sleep specialist and her primary care physician as scheduled.  She can be seen in this clinic on an as-needed basis.  I answered all her questions today and she was in agreement.  I spent 30 minutes in total face-to-face time and in reviewing records during pre-charting, more than 50% of which was spent in counseling and coordination of care, reviewing test results, reviewing medications and treatment regimen and/or in discussing or reviewing the diagnosis of rec. HAs, the prognosis and treatment options. Pertinent laboratory and imaging test results that were available during this  visit with the patient were reviewed by me and considered in my medical decision making (see chart for details).

## 2021-02-18 DIAGNOSIS — E119 Type 2 diabetes mellitus without complications: Secondary | ICD-10-CM | POA: Diagnosis not present

## 2021-02-18 DIAGNOSIS — H43812 Vitreous degeneration, left eye: Secondary | ICD-10-CM | POA: Diagnosis not present

## 2021-02-18 DIAGNOSIS — H25813 Combined forms of age-related cataract, bilateral: Secondary | ICD-10-CM | POA: Diagnosis not present

## 2021-02-28 DIAGNOSIS — J338 Other polyp of sinus: Secondary | ICD-10-CM | POA: Diagnosis not present

## 2021-02-28 DIAGNOSIS — J323 Chronic sphenoidal sinusitis: Secondary | ICD-10-CM | POA: Diagnosis not present

## 2021-03-20 DIAGNOSIS — E039 Hypothyroidism, unspecified: Secondary | ICD-10-CM | POA: Diagnosis not present

## 2021-03-20 DIAGNOSIS — H9202 Otalgia, left ear: Secondary | ICD-10-CM | POA: Diagnosis not present

## 2021-04-02 DIAGNOSIS — G4733 Obstructive sleep apnea (adult) (pediatric): Secondary | ICD-10-CM | POA: Diagnosis not present

## 2021-04-15 ENCOUNTER — Telehealth: Payer: Self-pay | Admitting: Cardiovascular Disease

## 2021-04-15 ENCOUNTER — Other Ambulatory Visit: Payer: Self-pay

## 2021-04-15 MED ORDER — PANTOPRAZOLE SODIUM 40 MG PO TBEC: 40.0000 mg | DELAYED_RELEASE_TABLET | Freq: Every day | ORAL | 2 refills | Status: DC

## 2021-04-15 NOTE — Telephone Encounter (Signed)
*  STAT* If patient is at the pharmacy, call can be transferred to refill team.   1. Which medications need to be refilled? (please list name of each medication and dose if known)  pantoprazole (PROTONIX) 40 MG tablet  2. Which pharmacy/location (including street and city if local pharmacy) is medication to be sent to? [facility], [address]  3. Do they need a 30 day or 90 day supply?  90 day supply  Patient states in order for a 90 day supply to be covered by [facility], it will have to be verbally overridden by the provider. Patient requested that we contact [facility] [facility] verbal assistance with the Rx.  Phone #: [phone] (option 1)

## 2021-04-15 NOTE — Telephone Encounter (Signed)
RX sent for a 90 day.

## 2021-04-16 ENCOUNTER — Other Ambulatory Visit: Payer: Self-pay | Admitting: *Deleted

## 2021-04-17 NOTE — Telephone Encounter (Signed)
Called pharmacy to clarify: Per pharmacist, insurance will only allow 1 fill of 90 in 1 year.  Last 90 day supply filled 01/11/21, next 90 day fill not allowed until 01/11/22 unless MD office calls insurance to overide this.    # [phone], option 1  Routed to primary nurse to follow up

## 2021-04-17 NOTE — Telephone Encounter (Signed)
Patient is following up. Again, she states in order for the medication to be covered by her insurance, it will have to be verbally overridden by our office. Please contact the patient's insurance to do so.  Phone #: [phone] (option 1)

## 2021-04-19 NOTE — Telephone Encounter (Signed)
Patient is following up, stating she has been completely out of medication for 5 days. She would like to know if there is anything that can be done to expedite the override. Are we able to provide samples to hold her over until this is completed? Please advise.

## 2021-04-19 NOTE — Telephone Encounter (Signed)
Spoke with pt insurance provider [employer], representative states that pt must have a prior authorization to get new 90 day prescription of protonix. Sent to PA department, spoke with Dorian Pod. Prior authorization completed and approved. Confirmation fax received.   Called pharmacy. Prior auth received and able to fill 90 day prescription. They will contact the pt when prescription is filled  Spoke with pt regarding prescription, informed pt that pharmacy will fill prescription and will contact her when it is ready for pick up. Pt verbalizes understanding.

## 2021-05-03 DIAGNOSIS — L814 Other melanin hyperpigmentation: Secondary | ICD-10-CM | POA: Diagnosis not present

## 2021-05-03 DIAGNOSIS — L720 Epidermal cyst: Secondary | ICD-10-CM | POA: Diagnosis not present

## 2021-05-09 DIAGNOSIS — Z1231 Encounter for screening mammogram for malignant neoplasm of breast: Secondary | ICD-10-CM | POA: Diagnosis not present

## 2021-05-30 ENCOUNTER — Other Ambulatory Visit: Payer: Self-pay | Admitting: Otolaryngology

## 2021-05-30 DIAGNOSIS — J310 Chronic rhinitis: Secondary | ICD-10-CM | POA: Diagnosis not present

## 2021-05-30 DIAGNOSIS — J323 Chronic sphenoidal sinusitis: Secondary | ICD-10-CM | POA: Diagnosis not present

## 2021-05-30 DIAGNOSIS — J329 Chronic sinusitis, unspecified: Secondary | ICD-10-CM

## 2021-05-30 DIAGNOSIS — J343 Hypertrophy of nasal turbinates: Secondary | ICD-10-CM | POA: Diagnosis not present

## 2021-06-12 DIAGNOSIS — H43812 Vitreous degeneration, left eye: Secondary | ICD-10-CM | POA: Diagnosis not present

## 2021-06-12 DIAGNOSIS — H04123 Dry eye syndrome of bilateral lacrimal glands: Secondary | ICD-10-CM | POA: Diagnosis not present

## 2021-06-12 DIAGNOSIS — E119 Type 2 diabetes mellitus without complications: Secondary | ICD-10-CM | POA: Diagnosis not present

## 2021-06-12 DIAGNOSIS — H25813 Combined forms of age-related cataract, bilateral: Secondary | ICD-10-CM | POA: Diagnosis not present

## 2021-06-17 DIAGNOSIS — E1151 Type 2 diabetes mellitus with diabetic peripheral angiopathy without gangrene: Secondary | ICD-10-CM | POA: Diagnosis not present

## 2021-06-17 DIAGNOSIS — M792 Neuralgia and neuritis, unspecified: Secondary | ICD-10-CM | POA: Diagnosis not present

## 2021-06-17 DIAGNOSIS — I739 Peripheral vascular disease, unspecified: Secondary | ICD-10-CM | POA: Diagnosis not present

## 2021-06-17 DIAGNOSIS — L603 Nail dystrophy: Secondary | ICD-10-CM | POA: Diagnosis not present

## 2021-06-17 DIAGNOSIS — L84 Corns and callosities: Secondary | ICD-10-CM | POA: Diagnosis not present

## 2021-06-17 DIAGNOSIS — M19071 Primary osteoarthritis, right ankle and foot: Secondary | ICD-10-CM | POA: Diagnosis not present

## 2021-06-17 DIAGNOSIS — B351 Tinea unguium: Secondary | ICD-10-CM | POA: Diagnosis not present

## 2021-06-17 DIAGNOSIS — M19072 Primary osteoarthritis, left ankle and foot: Secondary | ICD-10-CM | POA: Diagnosis not present

## 2021-06-19 ENCOUNTER — Ambulatory Visit
Admission: RE | Admit: 2021-06-19 | Discharge: 2021-06-19 | Disposition: A | Discharge status: Home / Self Care | Payer: Federal, State, Local not specified - PPO | Source: Ambulatory Visit | Attending: Otolaryngology | Admitting: Otolaryngology

## 2021-06-19 DIAGNOSIS — Z853 Personal history of malignant neoplasm of breast: Secondary | ICD-10-CM | POA: Diagnosis not present

## 2021-06-19 DIAGNOSIS — J329 Chronic sinusitis, unspecified: Secondary | ICD-10-CM

## 2021-06-26 DIAGNOSIS — J310 Chronic rhinitis: Secondary | ICD-10-CM | POA: Diagnosis not present

## 2021-06-26 DIAGNOSIS — J323 Chronic sphenoidal sinusitis: Secondary | ICD-10-CM | POA: Diagnosis not present

## 2021-06-26 DIAGNOSIS — J343 Hypertrophy of nasal turbinates: Secondary | ICD-10-CM | POA: Diagnosis not present

## 2021-07-05 DIAGNOSIS — L603 Nail dystrophy: Secondary | ICD-10-CM | POA: Diagnosis not present

## 2021-07-15 DIAGNOSIS — M19079 Primary osteoarthritis, unspecified ankle and foot: Secondary | ICD-10-CM | POA: Diagnosis not present

## 2021-07-15 DIAGNOSIS — L603 Nail dystrophy: Secondary | ICD-10-CM | POA: Diagnosis not present

## 2021-07-15 DIAGNOSIS — E1151 Type 2 diabetes mellitus with diabetic peripheral angiopathy without gangrene: Secondary | ICD-10-CM | POA: Diagnosis not present

## 2021-07-15 DIAGNOSIS — I739 Peripheral vascular disease, unspecified: Secondary | ICD-10-CM | POA: Diagnosis not present

## 2021-07-15 DIAGNOSIS — B351 Tinea unguium: Secondary | ICD-10-CM | POA: Diagnosis not present

## 2021-07-15 DIAGNOSIS — L84 Corns and callosities: Secondary | ICD-10-CM | POA: Diagnosis not present

## 2021-07-15 DIAGNOSIS — M792 Neuralgia and neuritis, unspecified: Secondary | ICD-10-CM | POA: Diagnosis not present

## 2021-08-01 DIAGNOSIS — E119 Type 2 diabetes mellitus without complications: Secondary | ICD-10-CM | POA: Diagnosis not present

## 2021-08-01 DIAGNOSIS — E039 Hypothyroidism, unspecified: Secondary | ICD-10-CM | POA: Diagnosis not present

## 2021-08-01 DIAGNOSIS — E785 Hyperlipidemia, unspecified: Secondary | ICD-10-CM | POA: Diagnosis not present

## 2021-08-01 DIAGNOSIS — M109 Gout, unspecified: Secondary | ICD-10-CM | POA: Diagnosis not present

## 2021-08-01 DIAGNOSIS — I10 Essential (primary) hypertension: Secondary | ICD-10-CM | POA: Diagnosis not present

## 2021-08-08 DIAGNOSIS — I700 Atherosclerosis of aorta: Secondary | ICD-10-CM | POA: Diagnosis not present

## 2021-08-08 DIAGNOSIS — Z1212 Encounter for screening for malignant neoplasm of rectum: Secondary | ICD-10-CM | POA: Diagnosis not present

## 2021-08-08 DIAGNOSIS — E785 Hyperlipidemia, unspecified: Secondary | ICD-10-CM | POA: Diagnosis not present

## 2021-08-08 DIAGNOSIS — I10 Essential (primary) hypertension: Secondary | ICD-10-CM | POA: Diagnosis not present

## 2021-08-08 DIAGNOSIS — G4733 Obstructive sleep apnea (adult) (pediatric): Secondary | ICD-10-CM | POA: Diagnosis not present

## 2021-08-08 DIAGNOSIS — Z0000 Encounter for general adult medical examination without abnormal findings: Secondary | ICD-10-CM | POA: Diagnosis not present

## 2021-08-08 DIAGNOSIS — E039 Hypothyroidism, unspecified: Secondary | ICD-10-CM | POA: Diagnosis not present

## 2021-08-08 DIAGNOSIS — E669 Obesity, unspecified: Secondary | ICD-10-CM | POA: Diagnosis not present

## 2021-08-08 DIAGNOSIS — R82998 Other abnormal findings in urine: Secondary | ICD-10-CM | POA: Diagnosis not present

## 2021-08-08 DIAGNOSIS — Z853 Personal history of malignant neoplasm of breast: Secondary | ICD-10-CM | POA: Diagnosis not present

## 2021-08-08 DIAGNOSIS — R519 Headache, unspecified: Secondary | ICD-10-CM | POA: Diagnosis not present

## 2021-08-08 DIAGNOSIS — Z1331 Encounter for screening for depression: Secondary | ICD-10-CM | POA: Diagnosis not present

## 2021-08-08 DIAGNOSIS — M109 Gout, unspecified: Secondary | ICD-10-CM | POA: Diagnosis not present

## 2021-08-08 DIAGNOSIS — Z1339 Encounter for screening examination for other mental health and behavioral disorders: Secondary | ICD-10-CM | POA: Diagnosis not present

## 2021-08-09 ENCOUNTER — Ambulatory Visit (INDEPENDENT_AMBULATORY_CARE_PROVIDER_SITE_OTHER): Payer: Medicare Other | Admitting: Endocrinology

## 2021-08-09 ENCOUNTER — Other Ambulatory Visit: Payer: Self-pay

## 2021-08-09 ENCOUNTER — Encounter: Payer: Self-pay | Admitting: Endocrinology

## 2021-08-09 VITALS — BP 100/78 | HR 63 | Ht 65.0 in | Wt 223.6 lb

## 2021-08-09 DIAGNOSIS — E039 Hypothyroidism, unspecified: Secondary | ICD-10-CM

## 2021-08-09 MED ORDER — LEVOTHYROXINE SODIUM 75 MCG PO TABS: 75.0000 ug | ORAL_TABLET | Freq: Every day | ORAL | 3 refills | Status: DC

## 2021-08-09 NOTE — Progress Notes (Signed)
Subjective:    Patient ID: Kathryn Diaz, female    DOB: 1950/12/04, 71 y.o.   MRN: [id number]  HPI Pt is referred by Dr Philip Aspen, for hypothyroidism.  Pt reports hypothyroidism was dx'ed in 1995.  she has been on prescribed thyroid hormone therapy since dx.  she has never taken non-prescribed thyroid product.  she has not recently had thyroid imaging.  she has never had thyroid surgery, or XRT to the neck.  she has never been on amiodarone or lithium.  She had thyr needle bx in approx 1998. She has cold intolerance, chronic headache, and dry skin.  Since 9/22, she has been on Synthroid, 75 mcg/d, M-F, 37.5 on Sat, and none on Sunday.  Pt has no GI disorder that would impair synthroid absorption.   Past Medical History:  Diagnosis Date   Atrioventricular block, second degree    Mobitz Type 1   Breast cancer ([place])    Headache(784.0)    History of nuclear stress test    lexiscan; normal study    Hypercholesteremia    Hypertension    Hypothyroidism    Sleep apnea    mouth piece no CPAP   Type II diabetes mellitus ([place])    "borderline"    Past Surgical History:  Procedure Laterality Date   ABDOMINAL HYSTERECTOMY     partial   BREAST RECONSTRUCTION  1983   COLONOSCOPY  2013   LAPAROSCOPY  11/11/2011   Procedure: LAPAROSCOPY OPERATIVE;  Surgeon: Gus Height, MD;  Location: [facility];  Service: Gynecology;  Laterality: N/A;   MASTECTOMY  1982   RIGHT BREAST    REPAIR TENDONS FOOT  ~ 2000   right ankle   RIGHT ANKLE REPAIR  1989   SALPINGOOPHORECTOMY  11/11/2011   Procedure: SALPINGO OOPHERECTOMY;  Surgeon: Gus Height, MD;  Location: [facility];  Service: Gynecology;  Laterality: Bilateral;   SINUS ENDO WITH FUSION Left 01/07/2021   Procedure: SINUS ENDOSCOPY WITH FUSION NAVIGATION;  Surgeon: Leta Baptist, MD;  Location: [facility];  Service: ENT;  Laterality: Left;   SPHENOIDECTOMY Left 01/07/2021   Procedure: LEFT ENDOSCOPIC  SPHENOIDECTOMY WITH TISSUE REMOVAL;  Surgeon: Leta Baptist, MD;  Location: [facility];  Service: ENT;  Laterality: Left;   TRANSTHORACIC ECHOCARDIOGRAM  12/2011   EF 60-65%; mod conc LVH, abnormal LV relaxation (grade 1 diastolic dysfunction); mild central MR; RA mildly dilated; mild TR   TUBAL LIGATION  1974    Social History   Socioeconomic History   Marital status: Divorced    Spouse name: Not on file   Number of children: 5   Years of education: Not on file   Highest education level: Not on file  Occupational History   Occupation: Engineer, 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[place] [employer]   Tobacco Use   Smoking status: Never   Smokeless tobacco: Never  Vaping Use   Vaping Use: Never used  Substance and Sexual Activity   Alcohol use: No   Drug use: No   Sexual activity: Never  Other Topics Concern   Not on file  Social History Narrative   Not on file   Social Determinants of Health   Financial Resource Strain: Not on file  Food Insecurity: Not on file  Transportation Needs: Not on file  Physical Activity: Not on file  Stress: Not on file  Social Connections: Not on file  Intimate Partner Violence: Not on file    Current Outpatient  Medications on File Prior to Visit  Medication Sig Dispense Refill   allopurinol (ZYLOPRIM) 300 MG tablet Take 300 mg by mouth daily.     carvedilol (COREG) 3.125 MG tablet Take 3.125 mg by mouth 2 (two) times daily with a meal.     losartan (COZAAR) 100 MG tablet TAKE 1/2 (ONE-HALF) TABLET BY MOUTH ONCE DAILY     meloxicam (MOBIC) 15 MG tablet TAKE 1 TABLET BY MOUTH ONCE DAILY AS NEEDED FOR PAIN FOR 90 DAYS     metFORMIN (GLUCOPHAGE-XR) 500 MG 24 hr tablet Take 500 mg by mouth daily with breakfast.     pantoprazole (PROTONIX) 40 MG tablet Take 1 tablet (40 mg total) by mouth daily. 90 tablet 2   simvastatin (ZOCOR) 80 MG tablet Take 1 tablet by mouth daily.     triamterene-hydrochlorothiazide (DYAZIDE) 37.5-25 MG capsule Take 1 capsule by mouth daily.     No current  facility-administered medications on file prior to visit.    No Known Allergies  Family History  Problem Relation Age of Onset   Clotting disorder Mother        blood clot   Thyroid disease Sister    Lung cancer Sister    Heart Problems Brother    Kidney disease Brother    Other Brother        cerebral hemorrhage    BP 100/78    Pulse 63    Ht 5\' 5"  (1.651 m)    Wt 223 lb 9.6 oz (101.4 kg)    SpO2 97%    BMI 37.21 kg/m     Review of Systems denies depression, weight gain, memory loss, constipation, and neck pain.      Objective:   Physical Exam VS: see vs page GEN: no distress HEAD: head: no deformity eyes: no periorbital swelling, no proptosis external nose and ears are normal NECK: supple, thyroid is not enlarged CHEST WALL: no deformity LUNGS: clear to auscultation CV: reg rate and rhythm, no murmur.  MUSCULOSKELETAL: gait is normal and steady EXTEMITIES: no deformity.  Trace bilat ankle edema NEURO:  readily moves all 4's.  sensation is intact to touch on all 4's SKIN:  Normal texture and temperature.  No rash or suspicious lesion is visible.   NODES:  None palpable at the neck.   PSYCH: alert, well-oriented.  Does not appear anxious nor depressed.     outside test results are reviewed: TSH=11.5 (08/01/21).   I have reviewed outside records, and summarized: Pt was noted to have elevated TSH, and referred here.  TFT have varied widely despite little if any change in dosage.       Assessment & Plan:  Hypothyroidism: uncontrolled.  I am not sure why TFT have varied, so we'll follow Reported h/o thyroid nodule(s).  We discussed.  She declines f/u US  Patient Instructions  I have sent a prescription to your pharmacy, to change the levothyroxine to 75 mcg per day, every day. Please recheck the blood tests in approx 1 month.  We'll try to minimize med changes as best we can.   Please come back for a follow-up appointment in 3 months

## 2021-08-09 NOTE — Patient Instructions (Addendum)
I have sent a prescription to your pharmacy, to change the levothyroxine to 75 mcg per day, every day. Please recheck the blood tests in approx 1 month.  We'll try to minimize med changes as best we can.   Please come back for a follow-up appointment in 3 months

## 2021-08-11 ENCOUNTER — Emergency Department (HOSPITAL_COMMUNITY)
Admission: EM | Admit: 2021-08-11 | Discharge: 2021-08-11 | Disposition: A | Discharge status: Home / Self Care | Payer: Medicare Other | Attending: Emergency Medicine | Admitting: Emergency Medicine

## 2021-08-11 ENCOUNTER — Encounter (HOSPITAL_COMMUNITY): Payer: Self-pay | Admitting: Emergency Medicine

## 2021-08-11 ENCOUNTER — Other Ambulatory Visit: Payer: Self-pay

## 2021-08-11 ENCOUNTER — Emergency Department (HOSPITAL_COMMUNITY): Payer: Medicare Other

## 2021-08-11 DIAGNOSIS — S161XXA Strain of muscle, fascia and tendon at neck level, initial encounter: Secondary | ICD-10-CM

## 2021-08-11 DIAGNOSIS — S199XXA Unspecified injury of neck, initial encounter: Secondary | ICD-10-CM | POA: Diagnosis not present

## 2021-08-11 DIAGNOSIS — M47812 Spondylosis without myelopathy or radiculopathy, cervical region: Secondary | ICD-10-CM | POA: Diagnosis not present

## 2021-08-11 DIAGNOSIS — Y92410 Unspecified street and highway as the place of occurrence of the external cause: Secondary | ICD-10-CM | POA: Diagnosis not present

## 2021-08-11 MED ORDER — NAPROXEN 250 MG PO TABS: 250.0000 mg | ORAL_TABLET | Freq: Two times a day (BID) | ORAL | 0 refills | Status: AC

## 2021-08-11 MED ORDER — CYCLOBENZAPRINE HCL 10 MG PO TABS: 10.0000 mg | ORAL_TABLET | Freq: Two times a day (BID) | ORAL | 0 refills | Status: DC | PRN
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
## 2021-08-11 NOTE — Discharge Instructions (Addendum)
Take the medications as needed for pain and discomfort.  Follow-up with your primary care doctor if symptoms do not resolve in the next week or so.
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
## 2021-09-05 DIAGNOSIS — H6123 Impacted cerumen, bilateral: Secondary | ICD-10-CM | POA: Diagnosis not present

## 2021-09-05 DIAGNOSIS — J343 Hypertrophy of nasal turbinates: Secondary | ICD-10-CM | POA: Diagnosis not present

## 2021-09-05 DIAGNOSIS — J310 Chronic rhinitis: Secondary | ICD-10-CM | POA: Diagnosis not present

## 2021-09-05 DIAGNOSIS — J323 Chronic sphenoidal sinusitis: Secondary | ICD-10-CM | POA: Diagnosis not present

## 2021-09-12 ENCOUNTER — Other Ambulatory Visit: Payer: Self-pay | Admitting: Endocrinology

## 2021-09-12 ENCOUNTER — Other Ambulatory Visit (INDEPENDENT_AMBULATORY_CARE_PROVIDER_SITE_OTHER): Payer: Medicare Other

## 2021-09-12 ENCOUNTER — Other Ambulatory Visit: Payer: Self-pay

## 2021-09-12 DIAGNOSIS — E039 Hypothyroidism, unspecified: Secondary | ICD-10-CM | POA: Diagnosis not present

## 2021-09-12 LAB — TSH: TSH: 27.6 u[IU]/mL — ABNORMAL HIGH (ref 0.35–5.50)

## 2021-09-12 LAB — T4, FREE: Free T4: 0.85 ng/dL (ref 0.60–1.60)

## 2021-09-12 MED ORDER — LEVOTHYROXINE SODIUM 88 MCG PO TABS: 88.0000 ug | ORAL_TABLET | Freq: Every day | ORAL | 3 refills | Status: DC

## 2021-09-16 DIAGNOSIS — E1151 Type 2 diabetes mellitus with diabetic peripheral angiopathy without gangrene: Secondary | ICD-10-CM | POA: Diagnosis not present

## 2021-09-16 DIAGNOSIS — B351 Tinea unguium: Secondary | ICD-10-CM | POA: Diagnosis not present

## 2021-09-16 DIAGNOSIS — L603 Nail dystrophy: Secondary | ICD-10-CM | POA: Diagnosis not present

## 2021-09-16 DIAGNOSIS — M19079 Primary osteoarthritis, unspecified ankle and foot: Secondary | ICD-10-CM | POA: Diagnosis not present

## 2021-09-16 DIAGNOSIS — M792 Neuralgia and neuritis, unspecified: Secondary | ICD-10-CM | POA: Diagnosis not present

## 2021-09-16 DIAGNOSIS — I739 Peripheral vascular disease, unspecified: Secondary | ICD-10-CM | POA: Diagnosis not present

## 2021-09-16 DIAGNOSIS — L84 Corns and callosities: Secondary | ICD-10-CM | POA: Diagnosis not present

## 2021-09-26 DIAGNOSIS — M542 Cervicalgia: Secondary | ICD-10-CM | POA: Diagnosis not present

## 2021-09-26 DIAGNOSIS — M25511 Pain in right shoulder: Secondary | ICD-10-CM | POA: Diagnosis not present

## 2021-09-26 DIAGNOSIS — M25512 Pain in left shoulder: Secondary | ICD-10-CM | POA: Diagnosis not present
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
## 2021-10-14 ENCOUNTER — Ambulatory Visit: Payer: Medicare Other | Attending: Sports Medicine

## 2021-10-14 ENCOUNTER — Other Ambulatory Visit: Payer: Self-pay

## 2021-10-14 DIAGNOSIS — R293 Abnormal posture: Secondary | ICD-10-CM | POA: Insufficient documentation

## 2021-10-14 DIAGNOSIS — M542 Cervicalgia: Secondary | ICD-10-CM | POA: Insufficient documentation

## 2021-10-14 DIAGNOSIS — M6281 Muscle weakness (generalized): Secondary | ICD-10-CM | POA: Insufficient documentation
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
## 2021-10-21 ENCOUNTER — Ambulatory Visit: Payer: Medicare Other

## 2021-10-22 DIAGNOSIS — M542 Cervicalgia: Secondary | ICD-10-CM | POA: Diagnosis not present
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
## 2021-10-24 ENCOUNTER — Ambulatory Visit: Payer: Medicare Other

## 2021-10-28 ENCOUNTER — Ambulatory Visit: Payer: Medicare Other | Attending: Sports Medicine | Admitting: Physical Therapy

## 2021-10-28 ENCOUNTER — Encounter: Payer: Self-pay | Admitting: Physical Therapy

## 2021-10-28 DIAGNOSIS — R293 Abnormal posture: Secondary | ICD-10-CM | POA: Insufficient documentation

## 2021-10-28 DIAGNOSIS — M542 Cervicalgia: Secondary | ICD-10-CM | POA: Diagnosis not present

## 2021-10-28 DIAGNOSIS — M6281 Muscle weakness (generalized): Secondary | ICD-10-CM | POA: Insufficient documentation
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
## 2021-10-31 ENCOUNTER — Ambulatory Visit: Payer: Medicare Other

## 2021-10-31 DIAGNOSIS — M6281 Muscle weakness (generalized): Secondary | ICD-10-CM | POA: Diagnosis not present

## 2021-10-31 DIAGNOSIS — R293 Abnormal posture: Secondary | ICD-10-CM

## 2021-10-31 DIAGNOSIS — M542 Cervicalgia: Secondary | ICD-10-CM
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
## 2021-10-31 NOTE — Patient Instructions (Signed)
Access Code: [email or web address] ?URL: [email or web address] ?Date: 10/31/2021 ?Prepared by: Kathreen Cornfield ? ?- Sidelying Open Book Thoracic Lumbar Rotation and Extension  - 1-2 x daily - 7 x weekly - 1-2 sets - 10 reps ?- Seated Cervical Traction  - 1-2 x daily - 7 x weekly - 3 sets - 10-20 seconds hold ?

## 2021-11-06 DIAGNOSIS — Z139 Encounter for screening, unspecified: Secondary | ICD-10-CM

## 2021-11-07 ENCOUNTER — Ambulatory Visit: Payer: Medicare Other
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
## 2021-11-14 ENCOUNTER — Ambulatory Visit: Payer: Medicare Other

## 2021-11-14 ENCOUNTER — Encounter: Payer: Self-pay | Admitting: Endocrinology

## 2021-11-14 ENCOUNTER — Ambulatory Visit (INDEPENDENT_AMBULATORY_CARE_PROVIDER_SITE_OTHER): Payer: Medicare Other | Admitting: Endocrinology

## 2021-11-14 VITALS — BP 124/80 | HR 66 | Ht 65.0 in | Wt 220.0 lb

## 2021-11-14 DIAGNOSIS — R739 Hyperglycemia, unspecified: Secondary | ICD-10-CM | POA: Diagnosis not present

## 2021-11-14 DIAGNOSIS — E039 Hypothyroidism, unspecified: Secondary | ICD-10-CM | POA: Diagnosis not present

## 2021-11-14 DIAGNOSIS — M6281 Muscle weakness (generalized): Secondary | ICD-10-CM | POA: Diagnosis not present

## 2021-11-14 DIAGNOSIS — R293 Abnormal posture: Secondary | ICD-10-CM | POA: Diagnosis not present

## 2021-11-14 DIAGNOSIS — M542 Cervicalgia: Secondary | ICD-10-CM | POA: Diagnosis not present

## 2021-11-14 DIAGNOSIS — E119 Type 2 diabetes mellitus without complications: Secondary | ICD-10-CM

## 2021-11-14 LAB — T4, FREE: Free T4: 0.85 ng/dL (ref 0.60–1.60)

## 2021-11-14 LAB — TSH: TSH: 26.13 u[IU]/mL — ABNORMAL HIGH (ref 0.35–5.50)

## 2021-11-14 LAB — HEMOGLOBIN A1C: Hgb A1c MFr Bld: 6.6 % — ABNORMAL HIGH (ref 4.6–6.5)

## 2021-11-14 MED ORDER — LEVOTHYROXINE SODIUM 112 MCG PO TABS: 112.0000 ug | ORAL_TABLET | Freq: Every day | ORAL | 1 refills | Status: DC

## 2021-11-14 NOTE — Patient Instructions (Addendum)
Blood tests are requested for you today.  We'll let you know about the results.   ?we'll try to minimize med changes as best we can.    ?You should have an endocrinology follow-up appointment in 3 months.   ? ? ? ? ?  ?
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
## 2021-11-14 NOTE — Progress Notes (Signed)
? ?Subjective:  ? ? Patient ID: Kathryn Diaz, female    DOB: Oct 29, 1950, 71 y.o.   MRN: [id number] ? ?HPI ?Pt returns for f/u of chronic primary hypothyroidism (dx'ed 1995; she has not recently had thyroid imaging; she had thyr needle bx in approx 1998, which she says benign; pt has no GI disorder that would impair synthroid absorption).  Since on 88/d, pt states she feels no different, and well in general.  She takes metformin for hyperglycemia.  ?Past Medical History:  ?Diagnosis Date  ? Atrioventricular block, second degree   ? Mobitz Type 1  ? Breast cancer ([place])   ? Headache(784.0)   ? History of nuclear stress test   ? lexiscan; normal study   ? Hypercholesteremia   ? Hypertension   ? Hypothyroidism   ? Sleep apnea   ? mouth piece no CPAP  ? Type II diabetes mellitus ([place])   ? "borderline"  ? ? ?Past Surgical History:  ?Procedure Laterality Date  ? ABDOMINAL HYSTERECTOMY    ? partial  ? BREAST RECONSTRUCTION  1983  ? COLONOSCOPY  2013  ? LAPAROSCOPY  11/11/2011  ? Procedure: LAPAROSCOPY OPERATIVE;  Surgeon: Gus Height, MD;  Location: [facility];  Service: Gynecology;  Laterality: N/A;  ? MASTECTOMY  1982  ? RIGHT BREAST   ? REPAIR TENDONS FOOT  ~ 2000  ? right ankle  ? RIGHT ANKLE REPAIR  1989  ? SALPINGOOPHORECTOMY  11/11/2011  ? Procedure: SALPINGO OOPHERECTOMY;  Surgeon: Gus Height, MD;  Location: [facility];  Service: Gynecology;  Laterality: Bilateral;  ? SINUS ENDO WITH FUSION Left 01/07/2021  ? Procedure: SINUS ENDOSCOPY WITH FUSION NAVIGATION;  Surgeon: Leta Baptist, MD;  Location: [place];  Service: ENT;  Laterality: Left;  ? SPHENOIDECTOMY Left 01/07/2021  ? Procedure: LEFT ENDOSCOPIC  SPHENOIDECTOMY WITH TISSUE REMOVAL;  Surgeon: Leta Baptist, MD;  Location: [place];  Service: ENT;  Laterality: Left;  ? TRANSTHORACIC ECHOCARDIOGRAM  12/2011  ? EF 60-65%; mod conc LVH, abnormal LV relaxation (grade 1 diastolic dysfunction); mild central MR; RA mildly dilated; mild TR  ? TUBAL  LIGATION  1974  ? ? ?Social History  ? ?Socioeconomic History  ? Marital status: Divorced  ?  Spouse name: Not on file  ? Number of children: 5  ? Years of education: Not on file  ? Highest education level: Not on file  ?Occupational History  ? Occupation: Engineer, water at [employer]  ?  Employer: [place] [employer]   ?Tobacco Use  ? Smoking status: Never  ? Smokeless tobacco: Never  ?Vaping Use  ? Vaping Use: Never used  ?Substance and Sexual Activity  ? Alcohol use: No  ? Drug use: No  ? Sexual activity: Never  ?Other Topics Concern  ? Not on file  ?Social History Narrative  ? Not on file  ? ?Social Determinants of Health  ? ?Financial Resource Strain: Not on file  ?Food Insecurity: Not on file  ?Transportation Needs: Not on file  ?Physical Activity: Not on file  ?Stress: Not on file  ?Social Connections: Not on file  ?Intimate Partner Violence: Not on file  ? ? ?Current Outpatient Medications on File Prior to Visit  ?Medication Sig Dispense Refill  ? allopurinol (ZYLOPRIM) 300 MG tablet Take 300 mg by mouth daily.    ? carvedilol (COREG) 3.125 MG tablet Take 3.125 mg by mouth 2 (two) times daily with a meal.    ? cyclobenzaprine (FLEXERIL) 10 MG  tablet Take 1 tablet (10 mg total) by mouth 2 (two) times daily as needed for muscle spasms. 20 tablet 0  ? losartan (COZAAR) 100 MG tablet TAKE 1/2 (ONE-HALF) TABLET BY MOUTH ONCE DAILY    ? meloxicam (MOBIC) 15 MG tablet TAKE 1 TABLET BY MOUTH ONCE DAILY AS NEEDED FOR PAIN FOR 90 DAYS    ? metFORMIN (GLUCOPHAGE-XR) 500 MG 24 hr tablet Take 500 mg by mouth daily with breakfast.    ? pantoprazole (PROTONIX) 40 MG tablet Take 1 tablet (40 mg total) by mouth daily. 90 tablet 2  ? simvastatin (ZOCOR) 80 MG tablet Take 1 tablet by mouth daily.    ? triamterene-hydrochlorothiazide (DYAZIDE) 37.5-25 MG capsule Take 1 capsule by mouth daily.    ? ?No current facility-administered medications on file prior to visit.  ? ? ?No Known Allergies ? ?Family History  ?Problem Relation Age  of Onset  ? Clotting disorder Mother   ?     blood clot  ? Thyroid disease Sister   ? Lung cancer Sister   ? Heart Problems Brother   ? Kidney disease Brother   ? Other Brother   ?     cerebral hemorrhage  ? ? ?BP 124/80 (BP Location: Left Arm, Patient Position: Sitting, Cuff Size: Normal)   Pulse 66   Ht '5\' 5"'$  (1.651 m)   Wt 220 lb (99.8 kg)   SpO2 99%   BMI 36.61 kg/m?  ? ?Review of Systems ? ?   ?Objective:  ? Physical Exam ?VITAL SIGNS:  See vs page ?GENERAL: no distress ?NECK: There is no palpable thyroid enlargement.  No thyroid nodule is palpable.  No palpable lymphadenopathy at the anterior neck.   ? ? ?Lab Results  ?Component Value Date  ? TSH 26.13 (H) 11/14/2021  ? ?   ?Assessment & Plan:  ?Hypothyroidism: uncontrolled.  I have sent a prescription to your pharmacy, to increase synthroid ? ?

## 2021-11-15 ENCOUNTER — Ambulatory Visit: Payer: Medicare Other | Admitting: Physical Therapy

## 2021-11-15 ENCOUNTER — Encounter: Payer: Self-pay | Admitting: Physical Therapy

## 2021-11-15 DIAGNOSIS — R293 Abnormal posture: Secondary | ICD-10-CM | POA: Diagnosis not present

## 2021-11-15 DIAGNOSIS — M542 Cervicalgia: Secondary | ICD-10-CM

## 2021-11-15 DIAGNOSIS — M6281 Muscle weakness (generalized): Secondary | ICD-10-CM
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
## 2021-11-18 ENCOUNTER — Ambulatory Visit: Payer: Medicare Other

## 2021-11-18 DIAGNOSIS — M6281 Muscle weakness (generalized): Secondary | ICD-10-CM | POA: Diagnosis not present

## 2021-11-18 DIAGNOSIS — R293 Abnormal posture: Secondary | ICD-10-CM | POA: Diagnosis not present

## 2021-11-18 DIAGNOSIS — M542 Cervicalgia: Secondary | ICD-10-CM | POA: Diagnosis not present

## 2021-11-18 NOTE — Addendum Note (Signed)
Addended by: Gillermina Phy on: 11/18/2021 08:33 AM ? ? Modules accepted: Orders ? ?
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
## 2021-11-21 ENCOUNTER — Ambulatory Visit: Payer: Medicare Other

## 2021-11-21 DIAGNOSIS — M542 Cervicalgia: Secondary | ICD-10-CM | POA: Diagnosis not present

## 2021-11-21 DIAGNOSIS — R293 Abnormal posture: Secondary | ICD-10-CM | POA: Diagnosis not present

## 2021-11-21 DIAGNOSIS — M6281 Muscle weakness (generalized): Secondary | ICD-10-CM | POA: Diagnosis not present

## 2021-11-26 ENCOUNTER — Ambulatory Visit: Payer: Medicare Other | Attending: Sports Medicine | Admitting: Physical Therapy

## 2021-11-26 ENCOUNTER — Encounter: Payer: Self-pay | Admitting: Physical Therapy

## 2021-11-26 DIAGNOSIS — M542 Cervicalgia: Secondary | ICD-10-CM | POA: Diagnosis not present

## 2021-11-26 DIAGNOSIS — R293 Abnormal posture: Secondary | ICD-10-CM | POA: Diagnosis not present

## 2021-11-26 DIAGNOSIS — M6281 Muscle weakness (generalized): Secondary | ICD-10-CM | POA: Diagnosis not present

## 2021-11-26 LAB — GLUCOSE, POCT (MANUAL RESULT ENTRY): POC Glucose: 123 mg/dl — AB (ref 70–99)

## 2021-11-26 NOTE — Congregational Nurse Program (Signed)
?  Dept: [phone] ? ? ?Congregational Nurse Program Note ? ?Date of Encounter: 11/06/2021 ? ?Past Medical History: ?Past Medical History:  ?Diagnosis Date  ? Atrioventricular block, second degree   ? Mobitz Type 1  ? Breast cancer ([place])   ? Headache(784.0)   ? History of nuclear stress test   ? lexiscan; normal study   ? Hypercholesteremia   ? Hypertension   ? Hypothyroidism   ? Sleep apnea   ? mouth piece no CPAP  ? Type II diabetes mellitus ([place])   ? "borderline"  ? ? ?Encounter Details: ? CNP Questionnaire - 11/26/21 1155   ? ?  ? Questionnaire  ? Do you give verbal consent to treat you today? Yes   ? Location Patient Served  Not Applicable   [place]  ? Visit Setting Church or Organization   ? Patient Status Unknown   ? Insurance Medicare   ? Insurance Referral N/A   ? Medication N/A   ? Medical Provider Yes   ? Screening Referrals N/A   ? Medical Referral N/A   ? Medical Appointment Made N/A   ? Food N/A   ? Transportation N/A   ? Housing/Utilities N/A   ? Interpersonal Safety N/A   ? Intervention Blood pressure;Blood glucose;Navigate Healthcare System;Educate;Spiritual Care;Support   ? ED Visit Averted N/A   ? Life-Saving Intervention Made N/A   ? ?  ?  ? ?  ? ? ?Today's Vitals  ? 11/06/21 1315  ?BP: 120/74  ?Pulse: 67  ?Resp: 16  ?Temp: 97.6 ?F (36.4 ?C)  ?TempSrc: Oral  ?SpO2: 100%  ?Weight: 217 lb (98.4 kg)  ?PainSc: 0-No pain  ? ?Body mass index is 36.11 kg/m?.  ? ?Patient came to clinic to obtain her vitals, weight, and blood glucose. During visit we discussed stress management and what brings the patient joy. The patient expressed spending quality time with grandchildren is a great way to reduce stress and she was spending more time with them. Patient about to begin a health challenge with her church to improve overall health. Patient denies pain at this time.  ? ?Andrez Grime, BSN, RN, CRRN,CMSRN  ? ? ?
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
## 2021-11-29 ENCOUNTER — Ambulatory Visit: Payer: Medicare Other | Admitting: Physical Therapy

## 2021-11-29 ENCOUNTER — Encounter: Payer: Self-pay | Admitting: Physical Therapy

## 2021-11-29 DIAGNOSIS — M6281 Muscle weakness (generalized): Secondary | ICD-10-CM

## 2021-11-29 DIAGNOSIS — M542 Cervicalgia: Secondary | ICD-10-CM | POA: Diagnosis not present

## 2021-11-29 DIAGNOSIS — R293 Abnormal posture: Secondary | ICD-10-CM | POA: Diagnosis not present
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
## 2021-12-03 ENCOUNTER — Encounter: Payer: Self-pay | Admitting: Physical Therapy

## 2021-12-03 ENCOUNTER — Ambulatory Visit: Payer: Medicare Other | Admitting: Physical Therapy

## 2021-12-03 ENCOUNTER — Encounter: Payer: Self-pay | Admitting: Cardiovascular Disease

## 2021-12-03 ENCOUNTER — Ambulatory Visit (INDEPENDENT_AMBULATORY_CARE_PROVIDER_SITE_OTHER): Payer: Medicare Other | Admitting: Cardiovascular Disease

## 2021-12-03 VITALS — BP 100/66 | HR 63 | Ht 65.0 in | Wt 216.0 lb

## 2021-12-03 DIAGNOSIS — R293 Abnormal posture: Secondary | ICD-10-CM

## 2021-12-03 DIAGNOSIS — M6281 Muscle weakness (generalized): Secondary | ICD-10-CM

## 2021-12-03 DIAGNOSIS — E782 Mixed hyperlipidemia: Secondary | ICD-10-CM

## 2021-12-03 DIAGNOSIS — I10 Essential (primary) hypertension: Secondary | ICD-10-CM | POA: Diagnosis not present

## 2021-12-03 DIAGNOSIS — M542 Cervicalgia: Secondary | ICD-10-CM

## 2021-12-03 NOTE — Assessment & Plan Note (Signed)
History of essential hypertension a blood pressure measured today at 100/66.  She is on carvedilol and losartan as well as Dyazide. ?

## 2021-12-03 NOTE — Progress Notes (Signed)
? ? ? ?12/03/2021 ?Kathryn Diaz   ?[birth year]  ?[id number] ? ?Primary Physician Donnajean Lopes, MD ?Primary Cardiologist: Lorretta Harp MD Lupe Carney, [place] ? ?HPI:  Kathryn Diaz is a 71 y.o.  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  I last saw her in the office   10/16/2020.Marland Kitchen She was seen at [facility] January 15, 2012 through January 16, 2012, with atypical chest pain like "somebody scared her." She ruled out for MI by troponins. She did have some second-degree AV block Mobitz type I which she also had on event monitoring as an outpatient. A 2D echo performed in the hospital was normal. A Myoview stress test performed in our office on January 23, 2012, was normal as well. She has had no recurrent symptoms. Her risk factors include hypertension, hyperlipidemia and diabetes, as well as a family history with a half-brother that had an MI.  ?  ?  ?Since I saw her in the office a year ago she continues to do well.  She gets occasional atypical noncardiac chest pain which sounds more like GERD which is improved on a PPI.  She denies shortness of breath. ? ? ?Current Meds  ?Medication Sig  ? allopurinol (ZYLOPRIM) 300 MG tablet Take 300 mg by mouth daily.  ? carvedilol (COREG) 3.125 MG tablet Take 3.125 mg by mouth 2 (two) times daily with a meal.  ? cyclobenzaprine (FLEXERIL) 10 MG tablet Take 1 tablet (10 mg total) by mouth 2 (two) times daily as needed for muscle spasms.  ? levothyroxine (SYNTHROID) 112 MCG tablet Take 1 tablet (112 mcg total) by mouth daily.  ? losartan (COZAAR) 100 MG tablet TAKE 1/2 (ONE-HALF) TABLET BY MOUTH ONCE DAILY  ? meloxicam (MOBIC) 15 MG tablet TAKE 1 TABLET BY MOUTH ONCE DAILY AS NEEDED FOR PAIN FOR 90 DAYS  ? metFORMIN (GLUCOPHAGE-XR) 500 MG 24 hr tablet Take 500 mg by mouth daily with breakfast.   ? pantoprazole (PROTONIX) 40 MG tablet Take 1 tablet (40 mg total) by mouth daily.  ? triamterene-hydrochlorothiazide (DYAZIDE) 37.5-25 MG capsule Take 1 capsule by mouth daily.  ?  ? ?No Known Allergies ? ?Social History  ? ?Socioeconomic History  ? Marital status: Divorced  ?  Spouse name: Not on file  ? Number of children: 5  ? Years of education: Not on file  ? Highest education level: Not on file  ?Occupational History  ? Occupation: Engineer, water at [employer]  ?  Employer: [place] [employer]   ?Tobacco Use  ? Smoking status: Never  ? Smokeless tobacco: Never  ?Vaping Use  ? Vaping Use: Never used  ?Substance and Sexual Activity  ? Alcohol use: No  ? Drug use: No  ? Sexual activity: Never  ?Other Topics Concern  ? Not on file  ?Social History Narrative  ? Not on file  ? ?Social Determinants of Health  ? ?Financial Resource Strain: Not on file  ?Food Insecurity: Not on file  ?Transportation Needs: Not on file  ?Physical Activity: Not on file  ?Stress: Not on file  ?Social Connections: Not on file  ?Intimate Partner Violence: Not on file  ?  ? ?Review of Systems: ?General: negative for chills, fever, night sweats or weight changes.  ?Cardiovascular: negative for chest pain, dyspnea on exertion, edema, orthopnea, palpitations, paroxysmal nocturnal dyspnea  or shortness of breath ?Dermatological: negative for rash ?Respiratory: negative for cough or wheezing ?Urologic: negative for hematuria ?Abdominal: negative for nausea, vomiting, diarrhea, bright red blood per rectum, melena, or hematemesis ?Neurologic: negative for visual changes, syncope, or dizziness ?All other systems reviewed and are otherwise negative except as noted above. ? ? ? ?Blood pressure 100/66, pulse 63, height '5\' 5"'$  (1.651 m), weight 216 lb (98 kg).  ?General appearance: alert and no distress ?Neck: no adenopathy, no carotid bruit, no JVD, supple, symmetrical, trachea midline, and thyroid not enlarged, symmetric, no  tenderness/mass/nodules ?Lungs: clear to auscultation bilaterally ?Heart: regular rate and rhythm, S1, S2 normal, no murmur, click, rub or gallop ?Extremities: extremities normal, atraumatic, no cyanosis or edema ?Pulses: 2+ and symmetric ?Skin: Skin color, texture, turgor normal. No rashes or lesions ?Neurologic: Grossly normal ? ?EKG sinus rhythm at 63 with low limb voltage, left intrafascicular block and poor R wave progression.  I personally reviewed this EKG. ? ?ASSESSMENT AND PLAN:  ? ?Hypertension ?History of essential hypertension a blood pressure measured today at 100/66.  She is on carvedilol and losartan as well as Dyazide. ? ?Hyperlipidemia ?History of hyperlipidemia not on statin therapy with lipid profile performed 08/01/2021 revealing a total cholesterol 166, LDL 107 and HDL 42, acceptable for primary prevention. ? ? ? ? ?Lorretta Harp MD FACP,FACC,FAHA, FSCAI ?12/03/2021 ?2:20 PM ?
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
## 2021-12-03 NOTE — Patient Instructions (Signed)

## 2021-12-03 NOTE — Assessment & Plan Note (Signed)
History of hyperlipidemia not on statin therapy with lipid profile performed 08/01/2021 revealing a total cholesterol 166, LDL 107 and HDL 42, acceptable for primary prevention. ?

## 2021-12-05 ENCOUNTER — Ambulatory Visit: Payer: Medicare Other | Admitting: Physical Therapy

## 2021-12-10 ENCOUNTER — Encounter: Payer: Federal, State, Local not specified - PPO | Admitting: Physical Therapy

## 2021-12-10 DIAGNOSIS — M25552 Pain in left hip: Secondary | ICD-10-CM | POA: Diagnosis not present

## 2021-12-10 DIAGNOSIS — M5106 Intervertebral disc disorders with myelopathy, lumbar region: Secondary | ICD-10-CM | POA: Diagnosis not present

## 2021-12-10 DIAGNOSIS — M545 Low back pain, unspecified: Secondary | ICD-10-CM | POA: Diagnosis not present

## 2021-12-10 DIAGNOSIS — M25551 Pain in right hip: Secondary | ICD-10-CM | POA: Diagnosis not present
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
## 2021-12-13 ENCOUNTER — Ambulatory Visit: Payer: Medicare Other | Admitting: Physical Therapy

## 2021-12-13 DIAGNOSIS — R293 Abnormal posture: Secondary | ICD-10-CM

## 2021-12-13 DIAGNOSIS — M542 Cervicalgia: Secondary | ICD-10-CM | POA: Diagnosis not present

## 2021-12-13 DIAGNOSIS — M6281 Muscle weakness (generalized): Secondary | ICD-10-CM

## 2021-12-24 DIAGNOSIS — M545 Low back pain, unspecified: Secondary | ICD-10-CM | POA: Diagnosis not present

## 2022-02-05 NOTE — Congregational Nurse Program (Signed)
  Dept: [phone]   Congregational Nurse Program Note  Date of Encounter: 01/08/2022  Past Medical History: Past Medical History:  Diagnosis Date   Atrioventricular block, second degree    Mobitz Type 1   Breast cancer ([place])    Headache(784.0)    History of nuclear stress test    lexiscan; normal study    Hypercholesteremia    Hypertension    Hypothyroidism    Sleep apnea    mouth piece no CPAP   Type II diabetes mellitus ([place])    "borderline"    Encounter Details:  CNP Questionnaire - 01/08/22 1302       Questionnaire   Do you give verbal consent to treat you today? Yes    Location Patient Served  Not Applicable   [place] or Organization    Patient Status Unknown    Insurance Medicare    Insurance Referral N/A    Medication N/A    Medical Provider Yes    Screening Referrals N/A    Medical Referral N/A    Medical Appointment Made N/A    Food N/A    Transportation N/A    Housing/Utilities N/A    Interpersonal Safety N/A    Intervention Educate;Spiritual Care;Support    ED Visit Averted N/A    Life-Saving Intervention Made N/A             Patient discussed some concerns about medications. Patient expressed she did not take some medications related to side effects. Discuss with patient the importance of informed PCP if patient is not taking a medication and reasons for hesitations. Patient is going to have a appointment in July for review of medication with a nurse and pharmacy. Patient states she is looking forward to appointment and we reviewed possible questions to ask.   Andrez Grime, BSN, RN, CRRN,CMSRN

## 2022-02-24 DIAGNOSIS — I10 Essential (primary) hypertension: Secondary | ICD-10-CM | POA: Diagnosis not present

## 2022-02-24 DIAGNOSIS — E785 Hyperlipidemia, unspecified: Secondary | ICD-10-CM | POA: Diagnosis not present

## 2022-02-24 DIAGNOSIS — E119 Type 2 diabetes mellitus without complications: Secondary | ICD-10-CM | POA: Diagnosis not present

## 2022-02-24 DIAGNOSIS — E039 Hypothyroidism, unspecified: Secondary | ICD-10-CM | POA: Diagnosis not present

## 2022-02-25 ENCOUNTER — Other Ambulatory Visit (HOSPITAL_COMMUNITY): Payer: Self-pay | Admitting: Orthopedic Surgery

## 2022-02-25 DIAGNOSIS — M79605 Pain in left leg: Secondary | ICD-10-CM

## 2022-02-25 DIAGNOSIS — S83282A Other tear of lateral meniscus, current injury, left knee, initial encounter: Secondary | ICD-10-CM | POA: Diagnosis not present

## 2022-02-26 ENCOUNTER — Ambulatory Visit (HOSPITAL_COMMUNITY)
Admission: RE | Admit: 2022-02-26 | Discharge: 2022-02-26 | Disposition: A | Discharge status: Home / Self Care | Payer: Medicare Other | Source: Ambulatory Visit | Attending: Cardiovascular Disease | Admitting: Cardiovascular Disease

## 2022-02-26 DIAGNOSIS — M79605 Pain in left leg: Secondary | ICD-10-CM | POA: Diagnosis not present

## 2022-02-27 NOTE — Progress Notes (Signed)
Patient ID: Kathryn Diaz, female   DOB: 1951-03-19, 71 y.o.   MRN: [id number]  HPI: Kathryn Diaz is a 71 y.o.-year-old female, returning for follow-up for DM2/prediabetes, dx in 1995, non-insulin-dependent, controlled, with complications (peripheral vascular disease) and also uncontrolled hypothyroidism. Pt. previously saw Dr. Loanne Drilling, last visit 3.5 months ago.  Reviewing her chart, she appears to have diabetes as an entry but patient tells me that she has a history of prediabetes.  She is taking a Healthy eating class at her  [place]. She improved her diet by reducing carbs and stopped sweet tea.  DM2: Reviewed HbA1c: Lab Results  Component Value Date   HGBA1C 6.6 (H) 11/14/2021   Pt is on a regimen of: - Metformin ER 500 mg 1x a day, with meals  Pt does not check her sugars. - am: n/c - 2h after b'fast: n/c - before lunch: n/c - 2h after lunch: n/c - before dinner: n/c - 2h after dinner: n/c - bedtime: n/c - nighttime: n/c  Glucometer: none  - no CKD, last BUN/creatinine was abnormal, though:  Lab Results  Component Value Date   BUN 15 12/27/2020   BUN 20 01/16/2012   CREATININE 1.41 (H) 12/27/2020   CREATININE 0.88 01/16/2012  She is is on losartan 50 mg daily.  -+ hyperlipidemia; last set of lipids: Per review of Dr. Kennon Holter note: 08/01/2021: total cholesterol 166, LDL 107 and HDL 42 Lab Results  Component Value Date   CHOL 136 01/16/2012   HDL 50 01/16/2012   LDLCALC 75 01/16/2012   TRIG 53 01/16/2012   CHOLHDL 2.7 01/16/2012  She was on simvastatin 80 mg daily >> changed to Rosuvastatin 20 mg daily.  - last eye exam was in 04/2021. No DR.   - no numbness and tingling in her feet. Saw [facility].  She also has uncontrolled hypothyroidism:  Pt is on levothyroxine 112 mcg daily (dose increased 10/2021), taken: - in am - fasting - 1h from b'fast - no calcium - no iron - no multivitamins - stopped PPIs - stopped 12/2021 (was taking it in  am) - not on Biotin  Reviewed her TFTs: Lab Results  Component Value Date   TSH 26.13 (H) 11/14/2021   TSH 27.60 (H) 09/12/2021   TSH 0.300 (L) 01/16/2012   She has a history of benign thyroid nodule biopsy in 1998.  She does have a history of OSA-on CPAP, HTN, breast cancer-1982, HAs  ROS: + see HPI No increased urination, blurry vision, nausea, chest pain.  She has a slightly enlarged left-venous ultrasound performed yesterday was negative.  Past Medical History:  Diagnosis Date   Atrioventricular block, second degree    Mobitz Type 1   Breast cancer ([place])    Headache(784.0)    History of nuclear stress test    lexiscan; normal study    Hypercholesteremia    Hypertension    Hypothyroidism    Sleep apnea    mouth piece no CPAP   Type II diabetes mellitus ([place])    "borderline"   Past Surgical History:  Procedure Laterality Date   ABDOMINAL HYSTERECTOMY     partial   BREAST RECONSTRUCTION  1983   COLONOSCOPY  2013   LAPAROSCOPY  11/11/2011   Procedure: LAPAROSCOPY OPERATIVE;  Surgeon: Gus Height, MD;  Location: [facility];  Service: Gynecology;  Laterality: N/A;   MASTECTOMY  1982   RIGHT BREAST    REPAIR TENDONS FOOT  ~ 2000   right ankle  RIGHT ANKLE REPAIR  1989   SALPINGOOPHORECTOMY  11/11/2011   Procedure: SALPINGO OOPHERECTOMY;  Surgeon: Gus Height, MD;  Location: [facility];  Service: Gynecology;  Laterality: Bilateral;   SINUS ENDO WITH FUSION Left 01/07/2021   Procedure: SINUS ENDOSCOPY WITH FUSION NAVIGATION;  Surgeon: Leta Baptist, MD;  Location: [facility];  Service: ENT;  Laterality: Left;   SPHENOIDECTOMY Left 01/07/2021   Procedure: LEFT ENDOSCOPIC  SPHENOIDECTOMY WITH TISSUE REMOVAL;  Surgeon: Leta Baptist, MD;  Location: [facility];  Service: ENT;  Laterality: Left;   TRANSTHORACIC ECHOCARDIOGRAM  12/2011   EF 60-65%; mod conc LVH, abnormal LV relaxation (grade 1 diastolic dysfunction); mild central MR; RA mildly dilated; mild TR    TUBAL LIGATION  1974   Social History   Socioeconomic History   Marital status: Divorced    Spouse name: Not on file   Number of children: 5   Years of education: Not on file   Highest education level: Not on file  Occupational History   Occupation: Engineer, 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[place] [employer]   Tobacco Use   Smoking status: Never   Smokeless tobacco: Never  Vaping Use   Vaping Use: Never used  Substance and Sexual Activity   Alcohol use: No   Drug use: No   Sexual activity: Never  Other Topics Concern   Not on file  Social History Narrative   Not on file   Social Determinants of Health   Financial Resource Strain: Not on file  Food Insecurity: Not on file  Transportation Needs: Not on file  Physical Activity: Not on file  Stress: Not on file  Social Connections: Not on file  Intimate Partner Violence: Not on file   Current Outpatient Medications on File Prior to Visit  Medication Sig Dispense Refill   allopurinol (ZYLOPRIM) 300 MG tablet Take 300 mg by mouth daily.     carvedilol (COREG) 3.125 MG tablet Take 3.125 mg by mouth 2 (two) times daily with a meal.     cyclobenzaprine (FLEXERIL) 10 MG tablet Take 1 tablet (10 mg total) by mouth 2 (two) times daily as needed for muscle spasms. 20 tablet 0   levothyroxine (SYNTHROID) 112 MCG tablet Take 1 tablet (112 mcg total) by mouth daily. 90 tablet 1   losartan (COZAAR) 100 MG tablet TAKE 1/2 (ONE-HALF) TABLET BY MOUTH ONCE DAILY     meloxicam (MOBIC) 15 MG tablet TAKE 1 TABLET BY MOUTH ONCE DAILY AS NEEDED FOR PAIN FOR 90 DAYS     metFORMIN (GLUCOPHAGE-XR) 500 MG 24 hr tablet Take 500 mg by mouth daily with breakfast.     pantoprazole (PROTONIX) 40 MG tablet Take 1 tablet (40 mg total) by mouth daily. 90 tablet 2   triamterene-hydrochlorothiazide (DYAZIDE) 37.5-25 MG capsule Take 1 capsule by mouth daily.     No current facility-administered medications on file prior to visit.   No Known Allergies Family  History  Problem Relation Age of Onset   Clotting disorder Mother        blood clot   Thyroid disease Sister    Lung cancer Sister    Heart Problems Brother    Kidney disease Brother    Other Brother        cerebral hemorrhage   PE: BP 134/76 (BP Location: Left Arm, Patient Position: Sitting, Cuff Size: Normal)   Pulse (!) 58   Ht $R'5\' 5"'GH$  (1.651 m)   Wt 217 lb  3.2 oz (98.5 kg)   SpO2 96%   BMI 36.14 kg/m  Wt Readings from Last 3 Encounters:  12/03/21 216 lb (98 kg)  11/14/21 220 lb (99.8 kg)  11/06/21 217 lb (98.4 kg)   Constitutional: overweight, in NAD Eyes: no exophthalmos ENT: moist mucous membranes, no thyromegaly, no cervical lymphadenopathy Cardiovascular: RRR, No MRG Respiratory: CTA B Musculoskeletal: no deformities Skin: moist, warm, no rashes Neurological: no tremor with outstretched hands  ASSESSMENT: 1. DM2 (or prediabetes), non-insulin-dependent, controlled, without long-term complications  2.  Uncontrolled hypothyroidism  PLAN:  1. Patient with long-standing, uncontrolled diabetes, on oral antidiabetic regimen, with minimal metformin dose, 500 mg daily, with good control.  Latest HbA1c was 6.6% at last visit with Dr. Loanne Drilling. At today's visit, HbA1c is 6.2% (lower). -At today's visit, she tells me that she started a healthy diet course at her church and she believes that this may be the reason why her HbA1c is lower.  She decreased carb intake, and she also stopped drinking sweet tea.  I advised her that she cannot drink sweet tea or any other sweet drinks going forward. -She is not taking blood sugars.  I advised her that she needs to start checking once a day or at least every other day, rotating check times.  We gave her glucometer and send supplies to her pharmacy.  Explained why this is important. -As of now, we can continue with metformin once a day.  She tolerates it well. -we discussed about criteria for diagnosis of prediabetes versus diabetes.  I  only have 1 high HbA1c for her, higher than 6.4%, and we discussed that we need 2 for diagnosis.  Unfortunately, I cannot access her PCPs labs.  She will bring these to me at next visit. - I suggested to:  Patient Instructions  Please continue: - Metformin ER 500 mg 1x a day  Start checking sugars 1x a day.  Continue Levothyroxine 112 mcg daily.  Take the thyroid hormone every day, with water, at least 30 minutes before breakfast, separated by at least 4 hours from: - acid reflux medications - calcium - iron - multivitamins  Please stop at the lab.  - discussed about CBG targets for treatment for diabetes: 80-130 mg/dL before meals and <180 mg/dL after meals; target HbA1c <7%. - given foot care handout  - given instructions for hypoglycemia management "15-15 rule"  - advised for yearly eye exams  - Return to clinic in 4 months   2.  Uncontrolled hypothyroidism - latest thyroid labs reviewed with pt. >> TSH was very high: Lab Results  Component Value Date   TSH 26.13 (H) 11/14/2021  - she continues on LT4 112 mcg daily -after the above results returned - pt feels good on this dose. - we discussed about taking the thyroid hormone every day, with water, >30 minutes before breakfast, separated by >4 hours from acid reflux medications, calcium, iron, multivitamins. Pt. is taking it correctly.  However, before 2 months ago, she was taking a PPI in the morning, close to levothyroxine.  This may have been the reason why her TSH was still high.  I advised her that if she needed to restart back on this, it needs to be taken around lunchtime or later - will check thyroid tests today: TSH and fT4 and change the levothyroxine dose accordingly. - If labs are abnormal, she will need to return for repeat TFTs in 1.5 months  Needs refills LT4.  - Total time spent for  the visit: 40 min, in precharting, reviewing Dr. Cordelia Pen last note, obtaining medical information from the chart and from the pt,  reviewing her  previous labs, evaluations, and treatments, reviewing her symptoms, counseling her about her diabetes/prediabetes and hypothyroidism (please see the discussed topics above), and developing a plan to further investigate and treat them.  Component     Latest Ref Rng 02/28/2022  Sodium     135 - 146 mmol/L 142   Potassium     3.5 - 5.3 mmol/L 4.2   Chloride     98 - 110 mmol/L 107   CO2     20 - 32 mmol/L 26   Glucose     65 - 99 mg/dL 86   BUN     7 - 25 mg/dL 27 (H)   Creatinine     0.60 - 1.00 mg/dL 1.11 (H)   Calcium     8.6 - 10.4 mg/dL 10.6 (H)   TSH     0.35 - 5.50 uIU/mL 1.00   T4,Free(Direct)     0.60 - 1.60 ng/dL 1.07   Hemoglobin A1C     4.0 - 5.6 % 6.2 !   Cholesterol     0 - 200 mg/dL 153   Triglycerides     0.0 - 149.0 mg/dL 53.0   HDL Cholesterol     >39.00 mg/dL 55.10   VLDL     0.0 - 40.0 mg/dL 10.6   LDL (calc)     0 - 99 mg/dL 87   Total CHOL/HDL Ratio 3   NonHDL 97.65   Microalb, Ur     0.0 - 1.9 mg/dL <0.7   Creatinine,U     mg/dL 36.1   MICROALB/CREAT RATIO     0.0 - 30.0 mg/g 1.9   eGFR     > OR = 60 mL/min/1.42m 53 (L)   BUN/Creatinine Ratio     6 - 22 (calc) 24 (H)   Total Protein     6.1 - 8.1 g/dL 6.9   Albumin MSPROF     3.6 - 5.1 g/dL 4.2   Globulin     1.9 - 3.7 g/dL (calc) 2.7   AG Ratio     1.0 - 2.5 (calc) 1.6   Total Bilirubin     0.2 - 1.2 mg/dL 0.4   Alkaline phosphatase (APISO)     37 - 153 U/L 54   AST     10 - 35 U/L 15   ALT     6 - 29 U/L 11     GFR is low and calcium is slightly high (patient on HCTZ).  ACR normal. TFTs are normal. We can continue the same dose of levothyroxine.  CPhilemon Kingdom MD PhD LTexas Rehabilitation Hospital Of ArlingtonEndocrinology

## 2022-02-28 ENCOUNTER — Ambulatory Visit (INDEPENDENT_AMBULATORY_CARE_PROVIDER_SITE_OTHER): Payer: Medicare Other | Admitting: Internal Medicine

## 2022-02-28 ENCOUNTER — Encounter: Payer: Self-pay | Admitting: Internal Medicine

## 2022-02-28 VITALS — BP 134/76 | HR 58 | Ht 65.0 in | Wt 217.2 lb

## 2022-02-28 DIAGNOSIS — E119 Type 2 diabetes mellitus without complications: Secondary | ICD-10-CM

## 2022-02-28 DIAGNOSIS — E039 Hypothyroidism, unspecified: Secondary | ICD-10-CM | POA: Diagnosis not present

## 2022-02-28 LAB — POCT GLYCOSYLATED HEMOGLOBIN (HGB A1C): Hemoglobin A1C: 6.2 % — AB (ref 4.0–5.6)

## 2022-02-28 LAB — LIPID PANEL
Cholesterol: 153 mg/dL (ref 0–200)
HDL: 55.1 mg/dL (ref >39.00)
LDL Cholesterol: 87 mg/dL (ref 0–99)
NonHDL: 97.65
Total CHOL/HDL Ratio: 3
Triglycerides: 53 mg/dL (ref 0.0–149.0)
VLDL: 10.6 mg/dL (ref 0.0–40.0)

## 2022-02-28 LAB — MICROALBUMIN / CREATININE URINE RATIO
Creatinine,U: 36.1 mg/dL
Microalb Creat Ratio: 1.9 mg/g (ref 0.0–30.0)
Microalb, Ur: <0.7 mg/dL (ref 0.0–1.9)

## 2022-02-28 LAB — TSH: TSH: 1 u[IU]/mL (ref 0.35–5.50)

## 2022-02-28 LAB — T4, FREE: Free T4: 1.07 ng/dL (ref 0.60–1.60)

## 2022-02-28 MED ORDER — ONETOUCH ULTRASOFT LANCETS MISC: 12 refills | Status: DC

## 2022-02-28 MED ORDER — ONETOUCH VERIO VI STRP: ORAL_STRIP | 12 refills | Status: DC

## 2022-02-28 NOTE — Patient Instructions (Addendum)
Please continue: - Metformin ER 500 mg 1x a day  Start checking sugars 1x a day.  Continue Levothyroxine 112 mcg daily.  Take the thyroid hormone every day, with water, at least 30 minutes before breakfast, separated by at least 4 hours from: - acid reflux medications - calcium - iron - multivitamins  Please stop at the lab.  PATIENT INSTRUCTIONS FOR TYPE 2 DIABETES:  **Please join MyChart!** - see attached instructions about how to join if you have not done so already.  DIET AND EXERCISE Diet and exercise is an important part of diabetic treatment.  We recommended aerobic exercise in the form of brisk walking (working between 40-60% of maximal aerobic capacity, similar to brisk walking) for 150 minutes per week (such as 30 minutes five days per week) along with 3 times per week performing 'resistance' training (using various gauge rubber tubes with handles) 5-10 exercises involving the major muscle groups (upper body, lower body and core) performing 10-15 repetitions (or near fatigue) each exercise. Start at half the above goal but build slowly to reach the above goals. If limited by weight, joint pain, or disability, we recommend daily walking in a swimming pool with water up to waist to reduce pressure from joints while allow for adequate exercise.    BLOOD GLUCOSES Monitoring your blood glucoses is important for continued management of your diabetes. Please check your blood glucoses 2-4 times a day: fasting, before meals and at bedtime (you can rotate these measurements - e.g. one day check before the 3 meals, the next day check before 2 of the meals and before bedtime, etc.).   HYPOGLYCEMIA (low blood sugar) Hypoglycemia is usually a reaction to not eating, exercising, or taking too much insulin/ other diabetes drugs.  Symptoms include tremors, sweating, hunger, confusion, headache, etc. Treat IMMEDIATELY with 15 grams of Carbs: 4 glucose tablets  cup regular juice/soda 2  tablespoons raisins 4 teaspoons sugar 1 tablespoon honey Recheck blood glucose in 15 mins and repeat above if still symptomatic/blood glucose <100.  RECOMMENDATIONS TO REDUCE YOUR RISK OF DIABETIC COMPLICATIONS: * Take your prescribed MEDICATION(S) * Follow a DIABETIC diet: Complex carbs, fiber rich foods, (monounsaturated and polyunsaturated) fats * AVOID saturated/trans fats, high fat foods, >2,300 mg salt per day. * EXERCISE at least 5 times a week for 30 minutes or preferably daily.  * DO NOT SMOKE OR DRINK more than 1 drink a day. * Check your FEET every day. Do not wear tightfitting shoes. Contact us if you develop an ulcer * See your EYE doctor once a year or more if needed * Get a FLU shot once a year * Get a PNEUMONIA vaccine once before and once after age 105 years  GOALS:  * Your Hemoglobin A1c of <7%  * fasting sugars need to be <130 * after meals sugars need to be <180 (2h after you start eating) * Your Systolic BP should be 001 or lower  * Your Diastolic BP should be 80 or lower  * Your HDL (Good Cholesterol) should be 40 or higher  * Your LDL (Bad Cholesterol) should be 100 or lower. * Your Triglycerides should be 150 or lower  * Your Urine microalbumin (kidney function) should be <30 * Your Body Mass Index should be 25 or lower   Please consider the following ways to cut down carbs and fat and increase fiber and micronutrients in your diet: - substitute whole grain for white bread or pasta - substitute brown rice for white  rice - substitute 90-calorie flat bread pieces for slices of bread when possible - substitute sweet potatoes or yams for white potatoes - substitute humus for margarine - substitute tofu for cheese when possible - substitute almond or rice milk for regular milk (would not drink soy milk daily due to concern for soy estrogen influence on breast cancer risk) - substitute dark chocolate for other sweets when possible - substitute water - can add  lemon or orange slices for taste - for diet sodas (artificial sweeteners will trick your body that you can eat sweets without getting calories and will lead you to overeating and weight gain in the long run) - do not skip breakfast or other meals (this will slow down the metabolism and will result in more weight gain over time)  - can try smoothies made from fruit and almond/rice milk in am instead of regular breakfast - can also try old-fashioned (not instant) oatmeal made with almond/rice milk in am - order the dressing on the side when eating salad at a restaurant (pour less than half of the dressing on the salad) - eat as little meat as possible - can try juicing, but should not forget that juicing will get rid of the fiber, so would alternate with eating raw veg./fruits or drinking smoothies - use as little oil as possible, even when using olive oil - can dress a salad with a mix of balsamic vinegar and lemon juice, for e.g. - use agave nectar, stevia sugar, or regular sugar rather than artificial sweateners - steam or broil/roast veggies  - snack on veggies/fruit/nuts (unsalted, preferably) when possible, rather than processed foods - reduce or eliminate aspartame in diet (it is in diet sodas, chewing gum, etc) Read the labels!  Try to read Dr. Janene Harvey book: "Program for Reversing Diabetes" for other ideas for healthy eating.

## 2022-03-01 LAB — COMPLETE METABOLIC PANEL WITH GFR
AG Ratio: 1.6 (calc) (ref 1.0–2.5)
ALT: 11 U/L (ref 6–29)
AST: 15 U/L (ref 10–35)
Albumin: 4.2 g/dL (ref 3.6–5.1)
Alkaline phosphatase (APISO): 54 U/L (ref 37–153)
BUN/Creatinine Ratio: 24 (calc) — ABNORMAL HIGH (ref 6–22)
BUN: 27 mg/dL — ABNORMAL HIGH (ref 7–25)
CO2: 26 mmol/L (ref 20–32)
Calcium: 10.6 mg/dL — ABNORMAL HIGH (ref 8.6–10.4)
Chloride: 107 mmol/L (ref 98–110)
Creat: 1.11 mg/dL — ABNORMAL HIGH (ref 0.60–1.00)
Globulin: 2.7 g/dL (calc) (ref 1.9–3.7)
Glucose, Bld: 86 mg/dL (ref 65–99)
Potassium: 4.2 mmol/L (ref 3.5–5.3)
Sodium: 142 mmol/L (ref 135–146)
Total Bilirubin: 0.4 mg/dL (ref 0.2–1.2)
Total Protein: 6.9 g/dL (ref 6.1–8.1)
eGFR: 53 mL/min/{1.73_m2} — ABNORMAL LOW (ref >=60)

## 2022-03-03 ENCOUNTER — Other Ambulatory Visit: Payer: Self-pay

## 2022-03-03 MED ORDER — ONETOUCH ULTRA 2 W/DEVICE KIT: PACK | 0 refills | Status: DC

## 2022-03-03 MED ORDER — ONETOUCH DELICA LANCETS 30G MISC: 12 refills | Status: DC

## 2022-03-13 DIAGNOSIS — J310 Chronic rhinitis: Secondary | ICD-10-CM | POA: Diagnosis not present

## 2022-03-13 DIAGNOSIS — J323 Chronic sphenoidal sinusitis: Secondary | ICD-10-CM | POA: Diagnosis not present

## 2022-03-13 DIAGNOSIS — J343 Hypertrophy of nasal turbinates: Secondary | ICD-10-CM | POA: Diagnosis not present

## 2022-04-03 DIAGNOSIS — G4733 Obstructive sleep apnea (adult) (pediatric): Secondary | ICD-10-CM | POA: Diagnosis not present

## 2022-04-25 ENCOUNTER — Other Ambulatory Visit: Payer: Self-pay

## 2022-04-25 DIAGNOSIS — E039 Hypothyroidism, unspecified: Secondary | ICD-10-CM

## 2022-04-25 MED ORDER — LEVOTHYROXINE SODIUM 112 MCG PO TABS: 112.0000 ug | ORAL_TABLET | Freq: Every day | ORAL | 2 refills | Status: DC

## 2022-04-29 IMAGING — CT CT CERVICAL SPINE W/O CM
3 of 4 series · 9 of 33 positions shown, 11 images · non-contrast
Comparison: None.

CLINICAL DATA: Neck trauma (Age >= 65y)



[Series 7: orthogonal bone · axial · 0.23mm/px · z∈[-145,-145]mm · 1 of 127 slices shown, 2 images]
[im 73/127  soft-tissue]
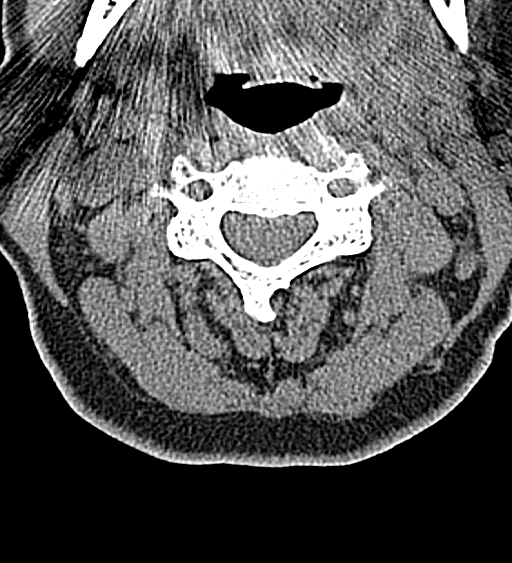
[im 73/127  bone]
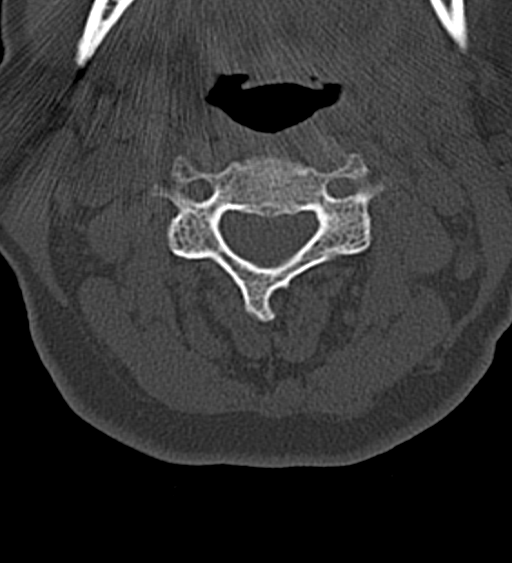

[Series 8: coronal bone · coronal · 0.23mm/px · 3 of 65 slices shown]
[im 13/65  bone]
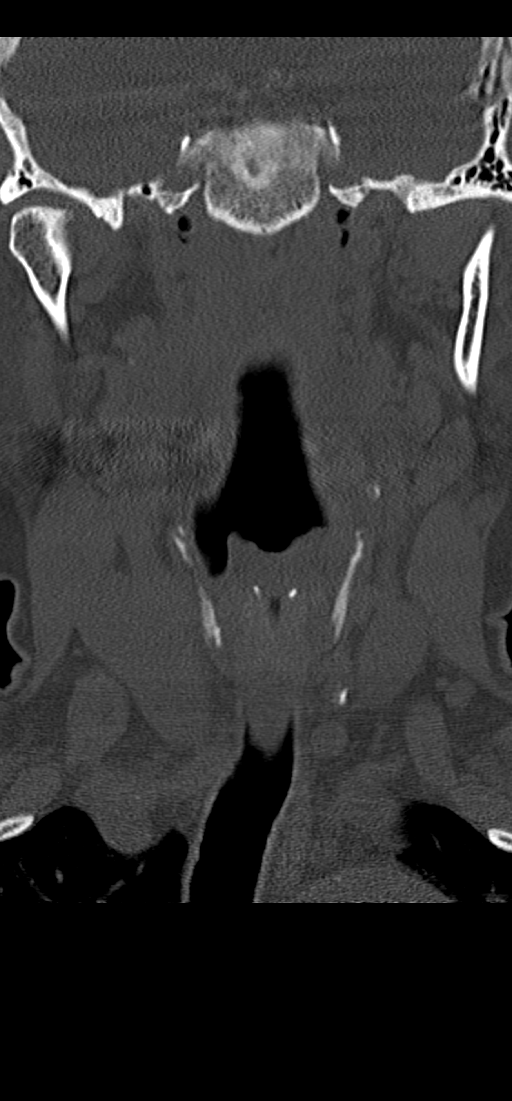
[im 26/65  bone]
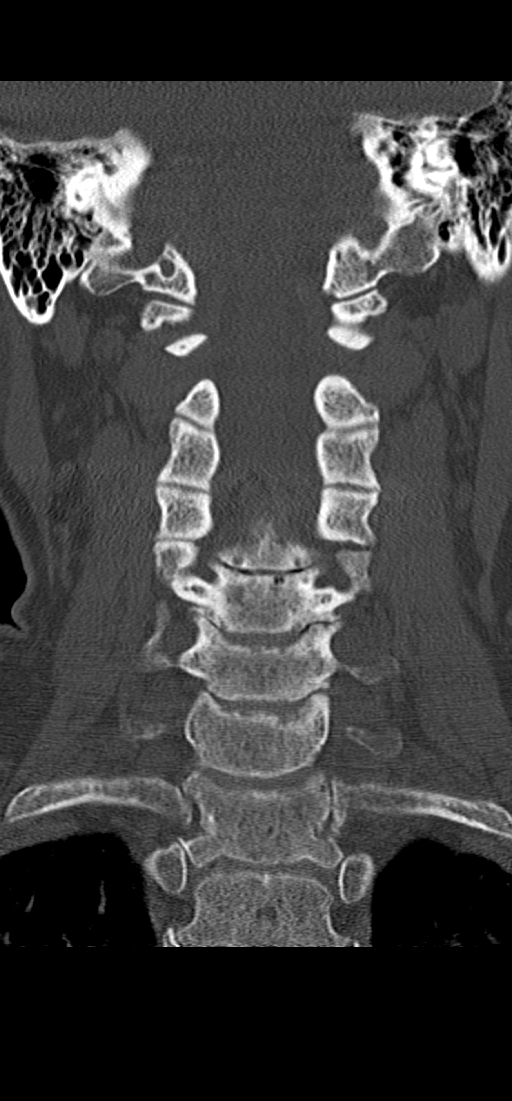
[im 39/65  bone]
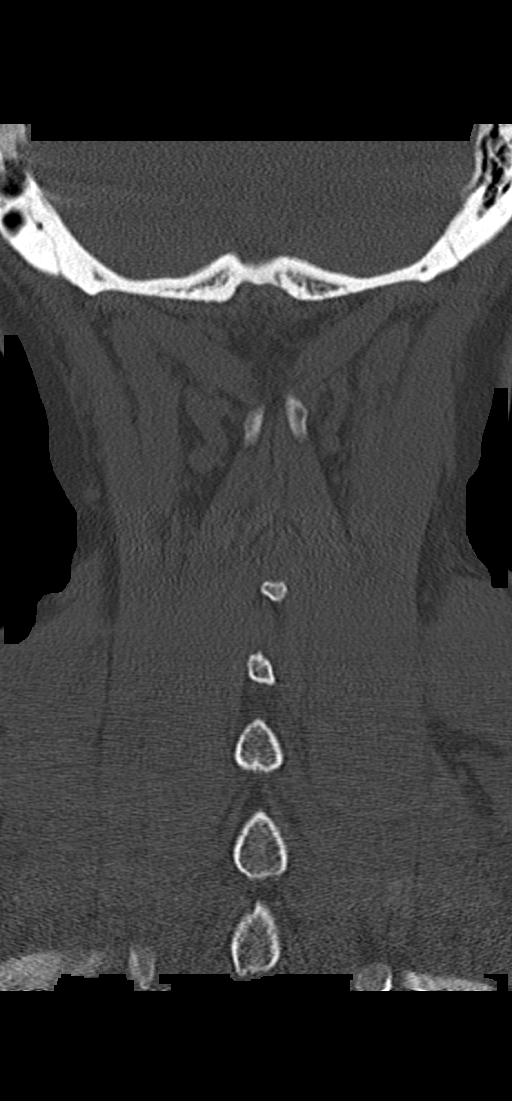

[Series 9: sagittal bone · sagittal · 0.26mm/px · 5 of 61 slices shown, 6 images]
[im 21/61  bone]
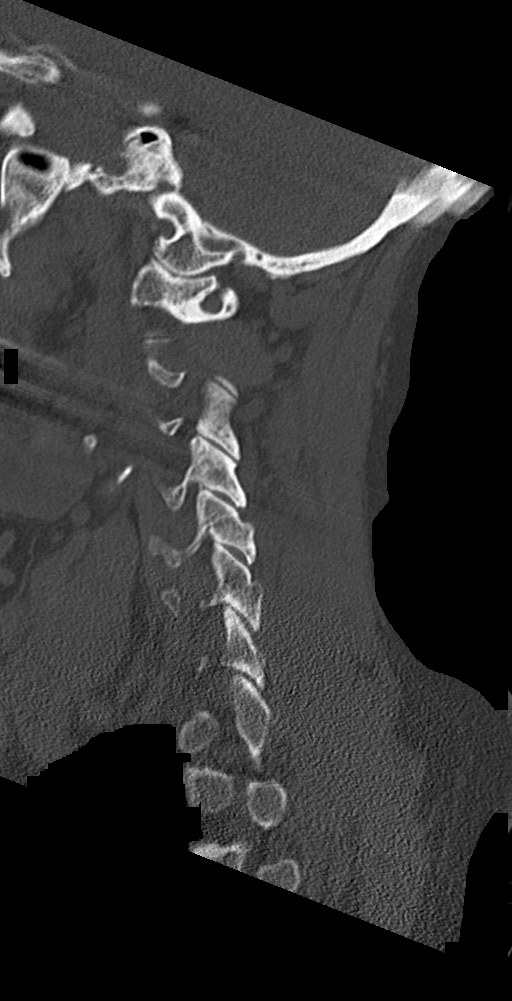
[im 26/61  bone]
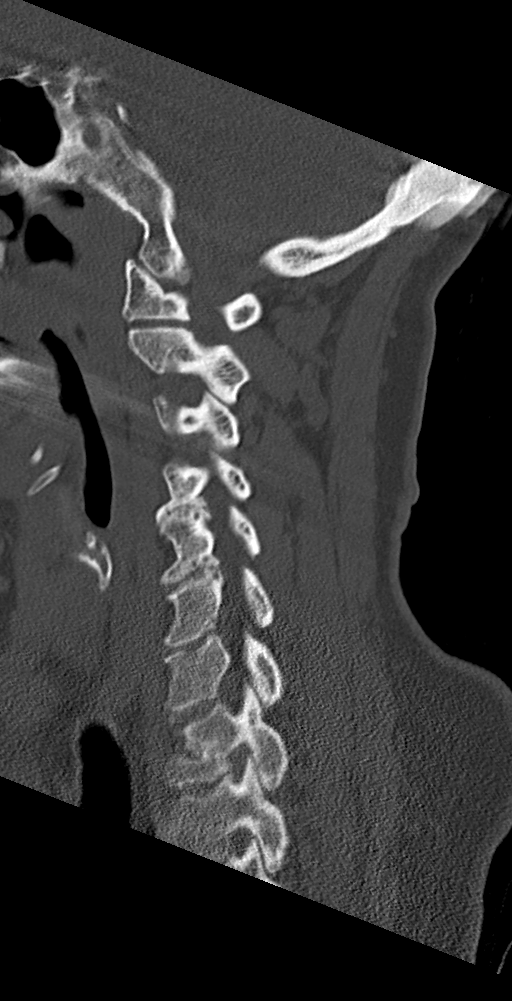
[im 31/61  soft-tissue]
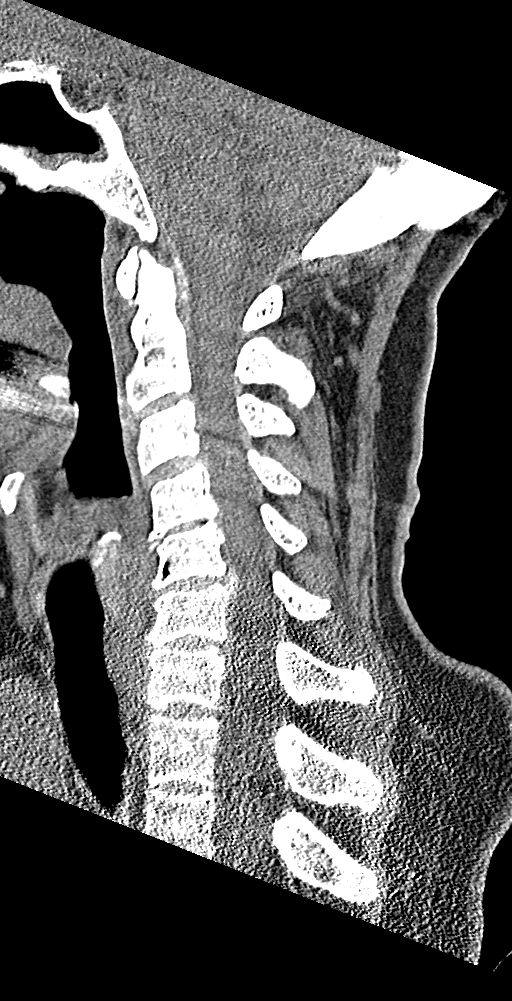
[im 31/61  bone]
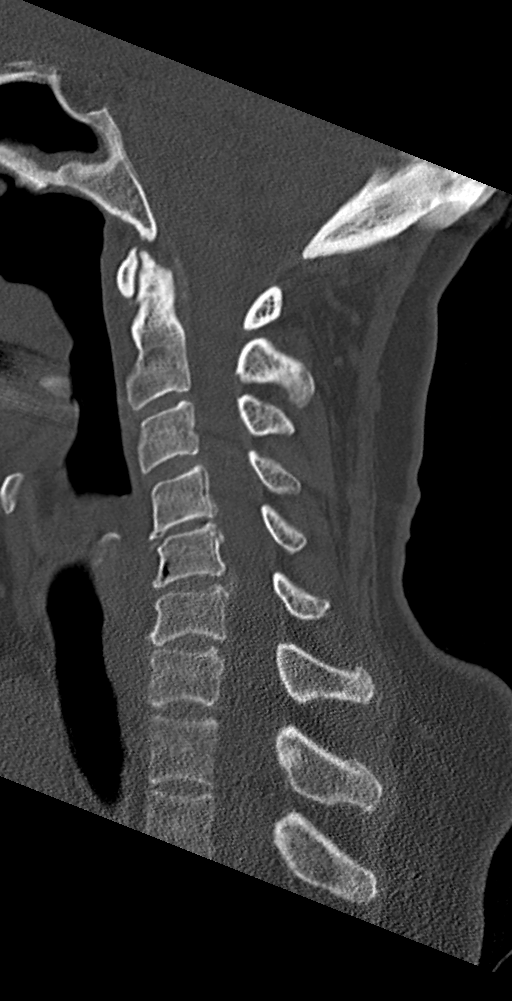
[im 36/61  bone]
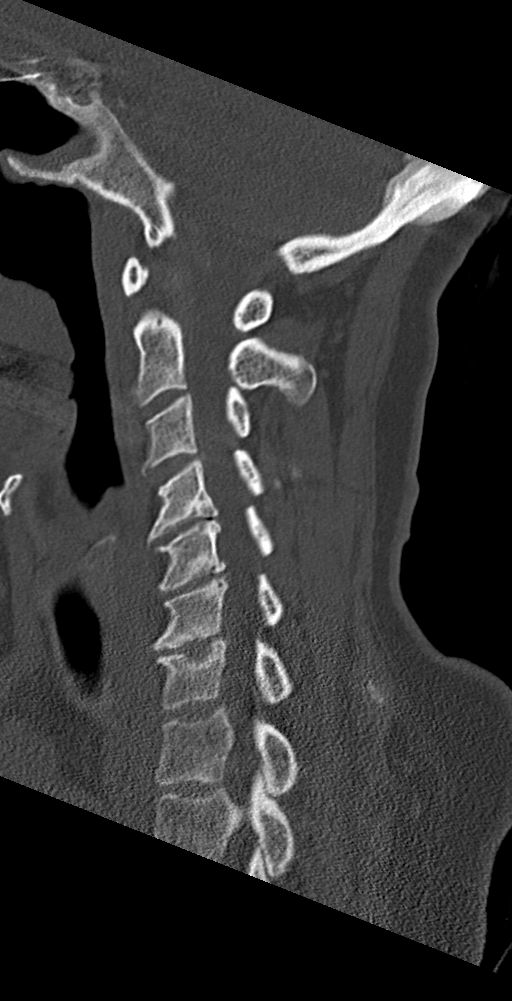
[im 41/61  bone]
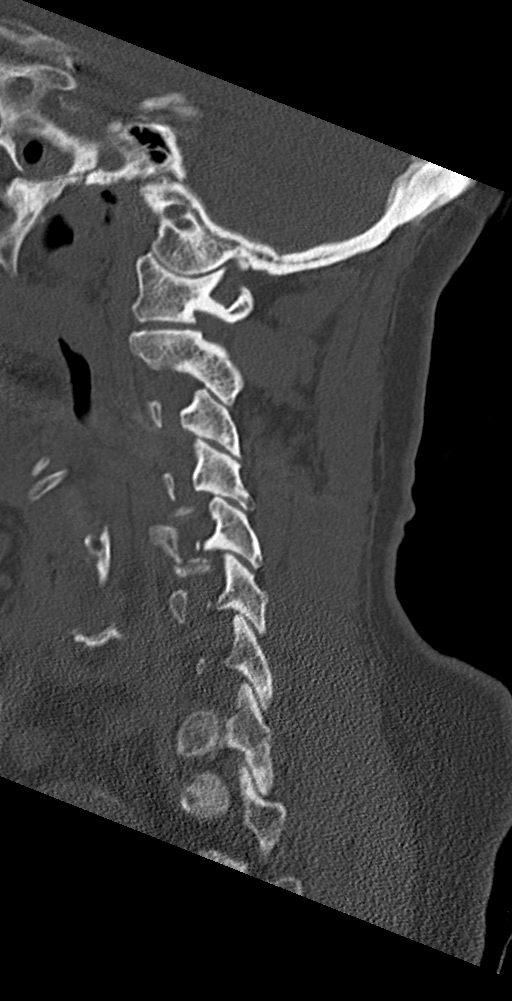

[9 of 33 positions shown; findings below may reference images not displayed]

FINDINGS: Alignment: Reversal of the normal cervical lordosis likely due to
positioning and degenerative changes.

Skull base and vertebrae: Multilevel degenerative changes of the
spine most prominent at the C4 through C6 levels. No associated
severe osseous neural foraminal or central canal stenosis. No acute
fracture. No aggressive appearing focal osseous lesion or focal
pathologic process.

Soft tissues and spinal canal: No prevertebral fluid or swelling. No
visible canal hematoma.

Upper chest: Unremarkable.

Other: None.
IMPRESSION: No acute displaced fracture or traumatic listhesis of the cervical
spine.

## 2022-05-07 DIAGNOSIS — H04123 Dry eye syndrome of bilateral lacrimal glands: Secondary | ICD-10-CM | POA: Diagnosis not present

## 2022-05-07 DIAGNOSIS — E119 Type 2 diabetes mellitus without complications: Secondary | ICD-10-CM | POA: Diagnosis not present

## 2022-05-07 DIAGNOSIS — R519 Headache, unspecified: Secondary | ICD-10-CM | POA: Diagnosis not present

## 2022-05-07 DIAGNOSIS — H25813 Combined forms of age-related cataract, bilateral: Secondary | ICD-10-CM | POA: Diagnosis not present

## 2022-05-13 ENCOUNTER — Encounter (HOSPITAL_COMMUNITY): Payer: Self-pay

## 2022-05-13 ENCOUNTER — Emergency Department (HOSPITAL_COMMUNITY)
Admission: EM | Admit: 2022-05-13 | Discharge: 2022-05-13 | Disposition: A | Discharge status: Home / Self Care | Payer: Medicare Other | Attending: Emergency Medicine | Admitting: Emergency Medicine

## 2022-05-13 DIAGNOSIS — Z79899 Other long term (current) drug therapy: Secondary | ICD-10-CM | POA: Insufficient documentation

## 2022-05-13 DIAGNOSIS — E876 Hypokalemia: Secondary | ICD-10-CM | POA: Diagnosis not present

## 2022-05-13 DIAGNOSIS — R102 Pelvic and perineal pain: Secondary | ICD-10-CM | POA: Diagnosis present

## 2022-05-13 DIAGNOSIS — I10 Essential (primary) hypertension: Secondary | ICD-10-CM | POA: Diagnosis not present

## 2022-05-13 DIAGNOSIS — E119 Type 2 diabetes mellitus without complications: Secondary | ICD-10-CM | POA: Diagnosis not present

## 2022-05-13 DIAGNOSIS — N814 Uterovaginal prolapse, unspecified: Secondary | ICD-10-CM | POA: Diagnosis not present

## 2022-05-13 DIAGNOSIS — N819 Female genital prolapse, unspecified: Secondary | ICD-10-CM | POA: Diagnosis not present

## 2022-05-13 DIAGNOSIS — Z7984 Long term (current) use of oral hypoglycemic drugs: Secondary | ICD-10-CM | POA: Insufficient documentation

## 2022-05-13 LAB — CBC
HCT: 36.8 % (ref 36.0–46.0)
Hemoglobin: 11.8 g/dL — ABNORMAL LOW (ref 12.0–15.0)
MCH: 28.2 pg (ref 26.0–34.0)
MCHC: 32.1 g/dL (ref 30.0–36.0)
MCV: 87.8 fL (ref 80.0–100.0)
Platelets: 255 10*3/uL (ref 150–400)
RBC: 4.19 MIL/uL (ref 3.87–5.11)
RDW: 16.5 % — ABNORMAL HIGH (ref 11.5–15.5)
WBC: 8.7 10*3/uL (ref 4.0–10.5)
nRBC: 0 % (ref 0.0–0.2)

## 2022-05-13 LAB — COMPREHENSIVE METABOLIC PANEL
ALT: 14 U/L (ref 0–44)
AST: 17 U/L (ref 15–41)
Albumin: 3.9 g/dL (ref 3.5–5.0)
Alkaline Phosphatase: 56 U/L (ref 38–126)
Anion gap: 8 (ref 5–15)
BUN: 20 mg/dL (ref 8–23)
CO2: 24 mmol/L (ref 22–32)
Calcium: 9.6 mg/dL (ref 8.9–10.3)
Chloride: 108 mmol/L (ref 98–111)
Creatinine, Ser: 1.27 mg/dL — ABNORMAL HIGH (ref 0.44–1.00)
GFR, Estimated: 45 mL/min — ABNORMAL LOW (ref >60)
Glucose, Bld: 109 mg/dL — ABNORMAL HIGH (ref 70–99)
Potassium: 3.3 mmol/L — ABNORMAL LOW (ref 3.5–5.1)
Sodium: 140 mmol/L (ref 135–145)
Total Bilirubin: 0.5 mg/dL (ref 0.3–1.2)
Total Protein: 7 g/dL (ref 6.5–8.1)

## 2022-05-13 LAB — URINALYSIS, ROUTINE W REFLEX MICROSCOPIC
Bilirubin Urine: NEGATIVE
Glucose, UA: NEGATIVE mg/dL
Ketones, ur: NEGATIVE mg/dL
Leukocytes,Ua: NEGATIVE
Nitrite: NEGATIVE
Protein, ur: NEGATIVE mg/dL
Specific Gravity, Urine: 1.02 (ref 1.005–1.030)
pH: 5 (ref 5.0–8.0)

## 2022-05-13 MED ORDER — POTASSIUM CHLORIDE CRYS ER 20 MEQ PO TBCR
20.0000 meq | EXTENDED_RELEASE_TABLET | Freq: Two times a day (BID) | ORAL | Status: DC
Start: 2022-05-13 — End: 2022-05-13
  Administered 2022-05-13: 20 meq via ORAL
  Filled 2022-05-13: qty 1

## 2022-05-13 NOTE — Discharge Instructions (Addendum)
You are seen in the ER today and diagnosed with likely vaginal prolapse.  Fortunately your physical exam in the ER was reassuring.  Tissue was returned to its normal physiologic position.  Please follow-up closely with your OB/GYN.  Call them later today to schedule follow-up appointment.  When you call them and inform them that you were seen in the ER for vaginal prolapse and that we are recommending that you be seen within the next week.  Pelvic organ prolapse can recur..  Please rest your pelvis, return to the ER if you have any recurrence of your symptoms that failed to resolve on their own or if you develop any other new severe symptoms.

## 2022-05-13 NOTE — ED Triage Notes (Signed)
Pt states that she was going to take a bath and she noticed something is hanging from her vagina, describes as a "chuck of meat" c/o of some lower abd pain, denies dysuria

## 2022-05-13 NOTE — ED Notes (Signed)
Pt ambulated from ed with steady gait. Pt dressed for discharge. Pt has access to home. Pt verbalizes understanding of discharge instructions.

## 2022-05-13 NOTE — ED Provider Notes (Signed)
[facility] DEPT Provider Note   CSN: [id number] Arrival date & time: 05/13/22  0001     History  Chief Complaint  Patient presents with   Vaginal Prolapse    Kathryn Diaz is a 71 y.o. female patient states that she was undressed to take a shower when she sat down on the toilet to urinate and wiped and felt a "chunk of meat" hanging out of her vagina.  No bleeding or pain associated at that time.  Patient was very concerned prompting ED visit.  Did urinate in the ED to provide urine sample and states that this had resolved and "had gone back inside".  No history of the same.  Does endorse some pelvic cramping at this time but is otherwise asymptomatic.  I personally reviewed her medical records previous history of diabetes and hypertension as well as hyperlipidemia.  Is scheduled to see her OB/GYN in November.  She is not on any anticoagulation.  HPI     Home Medications Prior to Admission medications   Medication Sig Start Date End Date Taking? Authorizing Provider  allopurinol (ZYLOPRIM) 300 MG tablet Take 300 mg by mouth daily.    [provider]  Blood Glucose Monitoring Suppl (ONE TOUCH ULTRA 2) w/Device KIT Check blood sugar 1 time daily 03/03/22   Philemon Kingdom, MD  carvedilol (COREG) 3.125 MG tablet Take 3.125 mg by mouth 2 (two) times daily with a meal.    [provider]  cyclobenzaprine (FLEXERIL) 10 MG tablet Take 1 tablet (10 mg total) by mouth 2 (two) times daily as needed for muscle spasms. 08/11/21   Dorie Rank, MD  glucose blood (ONETOUCH VERIO) test strip Use as instructed to check blood sugar 1X daily 02/28/22   Philemon Kingdom, MD  Lancets [facility] ULTRASOFT) lancets Use as instructed to check blood sugar 1X daily 02/28/22   Philemon Kingdom, MD  levothyroxine (SYNTHROID) 112 MCG tablet Take 1 tablet (112 mcg total) by mouth daily. 04/25/22   Philemon Kingdom, MD  losartan (COZAAR) 100 MG tablet TAKE 1/2  (ONE-HALF) TABLET BY MOUTH ONCE DAILY 08/31/20   [provider]  meloxicam (MOBIC) 15 MG tablet TAKE 1 TABLET BY MOUTH ONCE DAILY AS NEEDED FOR PAIN FOR 90 DAYS 08/09/20   [provider]  metFORMIN (GLUCOPHAGE-XR) 500 MG 24 hr tablet Take 500 mg by mouth daily with breakfast.    [provider]  OneTouch Delica Lancets 78G MISC Check blood sugar 1 time daly 03/03/22   Philemon Kingdom, MD  pantoprazole (PROTONIX) 40 MG tablet Take 1 tablet (40 mg total) by mouth daily. 04/15/21   Lorretta Harp, MD  triamterene-hydrochlorothiazide (DYAZIDE) 37.5-25 MG capsule Take 1 capsule by mouth daily.    [provider]      Allergies    Patient has no known allergies.    Review of Systems   Review of Systems  Genitourinary:  Negative for decreased urine volume, dysuria, flank pain, frequency, genital sores, hematuria, menstrual problem, pelvic pain, urgency, vaginal bleeding and vaginal discharge.       Soft tissue protruding from the vagina    Physical Exam Updated Vital Signs BP 112/68 (BP Location: Left Arm)   Pulse (!) 58   Temp 97.8 F (36.6 C) (Oral)   Resp 15   SpO2 96%  Physical Exam Vitals and nursing note reviewed. 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  Constitutional:      Appearance: She is not ill-appearing or  toxic-appearing.  HENT:     Head: Normocephalic and atraumatic.     Mouth/Throat:     Mouth: Mucous membranes are moist.     Pharynx: No oropharyngeal exudate or posterior oropharyngeal erythema.  Eyes:     General:        Right eye: No discharge.        Left eye: No discharge.     Conjunctiva/sclera: Conjunctivae normal.  Cardiovascular:     Rate and Rhythm: Normal rate and regular rhythm.     Pulses: Normal pulses.     Heart sounds: Normal heart sounds. No murmur heard. Pulmonary:     Effort: Pulmonary effort is normal. No respiratory distress.     Breath sounds: Normal breath sounds. No wheezing or rales.   Abdominal:     General: Bowel sounds are normal. There is no distension.     Palpations: Abdomen is soft.     Tenderness: There is no abdominal tenderness. There is no guarding or rebound.  Genitourinary:    Comments: No evidence of pelvic organ prolapse on physical exam at this time.  Normal vaginal exam and external genitalia. Musculoskeletal:        General: No deformity.     Cervical back: Neck supple.     Right lower leg: No edema.     Left lower leg: No edema.  Skin:    General: Skin is warm and dry.     Capillary Refill: Capillary refill takes less than 2 seconds.  Neurological:     General: No focal deficit present.     Mental Status: She is alert and oriented to person, place, and time. Mental status is at baseline.  Psychiatric:        Mood and Affect: Mood normal.     ED Results / Procedures / Treatments   Labs (all labs ordered are listed, but only abnormal results are displayed) Labs Reviewed  COMPREHENSIVE METABOLIC PANEL - Abnormal; Notable for the following components:      Result Value   Potassium 3.3 (*)    Glucose, Bld 109 (*)    Creatinine, Ser 1.27 (*)    GFR, Estimated 45 (*)    All other components within normal limits  CBC - Abnormal; Notable for the following components:   Hemoglobin 11.8 (*)    RDW 16.5 (*)    All other components within normal limits  URINALYSIS, ROUTINE W REFLEX MICROSCOPIC - Abnormal; Notable for the following components:   Hgb urine dipstick SMALL (*)    Bacteria, UA RARE (*)    All other components within normal limits    EKG None  Radiology No results found.  Procedures Procedures   Medications Ordered in ED Medications  potassium chloride SA (KLOR-CON M) CR tablet 20 mEq (has no administration in time range)    ED Course/ Medical Decision Making/ A&P                           Medical Decision Making 71 year old female presents with concern for soft tissue protruding from the vagina today.  Now  resolved.  Hypertensive on intake vitals otherwise normal.  Cardiopulmonary abdominal signs are benign.  Pelvic exam unremarkable.    Amount and/or Complexity of Data Reviewed Labs: ordered.    Details: CBC remarkable only for my old anemia with hemoglobin 11.8 near patient's baseline.  UA without evidence of infection, CMP with mild hypokalemia repleted orally in the ED.  Creatinine 1.27 near patient's baseline.  Risk Prescription drug management.   Most likely etiology of patient's symptoms is pelvic organ prolapse.  This is now resolved at this time.  Patient given instructions on expectant management and recommendation to follow-up very closely with OB/GYN.  No further comport in the ER at this time.  Clinical concern for emergent underlying etiology would warrant further ED work-up or inpatient management exceedingly low.  Liela and her daughter  voiced understanding of her medical evaluation and treatment plan. Each of their questions answered to their expressed satisfaction.  Return precautions were given.  Patient is well-appearing, stable, and was discharged in good condition.  This chart was dictated using voice recognition software, Dragon. Despite the best efforts of this provider to proofread and correct errors, errors may still occur which can change documentation meaning.   Final Clinical Impression(s) / ED Diagnoses Final diagnoses:  Female genital prolapse, unspecified type    Rx / DC Orders ED Discharge Orders     None         Aura Dials 05/13/22 0555    Quintella Reichert, MD 05/13/22 [phone]

## 2022-05-15 DIAGNOSIS — Z1231 Encounter for screening mammogram for malignant neoplasm of breast: Secondary | ICD-10-CM | POA: Diagnosis not present

## 2022-05-21 DIAGNOSIS — N814 Uterovaginal prolapse, unspecified: Secondary | ICD-10-CM | POA: Diagnosis not present

## 2022-05-29 DIAGNOSIS — Z124 Encounter for screening for malignant neoplasm of cervix: Secondary | ICD-10-CM | POA: Diagnosis not present

## 2022-05-29 DIAGNOSIS — N819 Female genital prolapse, unspecified: Secondary | ICD-10-CM | POA: Diagnosis not present

## 2022-06-24 DIAGNOSIS — S83282A Other tear of lateral meniscus, current injury, left knee, initial encounter: Secondary | ICD-10-CM | POA: Diagnosis not present

## 2022-06-24 DIAGNOSIS — M25562 Pain in left knee: Secondary | ICD-10-CM | POA: Diagnosis not present

## 2022-07-02 DIAGNOSIS — H524 Presbyopia: Secondary | ICD-10-CM | POA: Diagnosis not present

## 2022-07-02 DIAGNOSIS — H35033 Hypertensive retinopathy, bilateral: Secondary | ICD-10-CM | POA: Diagnosis not present

## 2022-07-02 DIAGNOSIS — H04123 Dry eye syndrome of bilateral lacrimal glands: Secondary | ICD-10-CM | POA: Diagnosis not present

## 2022-07-02 DIAGNOSIS — E119 Type 2 diabetes mellitus without complications: Secondary | ICD-10-CM | POA: Diagnosis not present

## 2022-07-02 DIAGNOSIS — H25813 Combined forms of age-related cataract, bilateral: Secondary | ICD-10-CM | POA: Diagnosis not present

## 2022-07-15 ENCOUNTER — Ambulatory Visit (INDEPENDENT_AMBULATORY_CARE_PROVIDER_SITE_OTHER): Payer: Medicare Other | Admitting: Internal Medicine

## 2022-07-15 ENCOUNTER — Encounter: Payer: Self-pay | Admitting: Internal Medicine

## 2022-07-15 VITALS — BP 108/68 | HR 65 | Ht 65.0 in | Wt 210.4 lb

## 2022-07-15 DIAGNOSIS — E039 Hypothyroidism, unspecified: Secondary | ICD-10-CM

## 2022-07-15 DIAGNOSIS — R7303 Prediabetes: Secondary | ICD-10-CM

## 2022-07-15 LAB — TSH: TSH: 0.03 u[IU]/mL — ABNORMAL LOW (ref 0.35–5.50)

## 2022-07-15 LAB — POCT GLYCOSYLATED HEMOGLOBIN (HGB A1C): Hemoglobin A1C: 5.8 % — AB (ref 4.0–5.6)

## 2022-07-15 LAB — T4, FREE: Free T4: 1.37 ng/dL (ref 0.60–1.60)

## 2022-07-15 NOTE — Patient Instructions (Addendum)
Please continue: - Metformin ER 500 mg 1x a day  Check sugars 1x a day, rotating check times.  Continue Levothyroxine 112 mcg daily.  Take the thyroid hormone every day, with water, at least 30 minutes before breakfast, separated by at least 4 hours from: - acid reflux medications - calcium - iron - multivitamins  Please return for another visit in 6 months.

## 2022-07-15 NOTE — Progress Notes (Unsigned)
Patient ID: Kathryn Diaz, female   DOB: 1951/03/03, 71 y.o.   MRN: [id number]  HPI: Kathryn Diaz is a 71 y.o.-year-old female, returning for follow-up for prediabetes, dx in 1995, non-insulin-dependent, controlled, with complications (peripheral vascular disease, CKD) and also uncontrolled hypothyroidism. Pt. previously saw Dr. Loanne Drilling, but last visit with me 4 months ago.  Interim history: No increased urination, blurry vision, nausea, chest pain. She twisted her R ankle the other day >> she had a little ankle swelling - improving. Before last visit, she started a healthy eating class at her [facility]. She improved her diet by reducing carbs and stopped sweet tea.  She continues with this eating plan.  Reviewed history: Reviewing her chart, she appears to have diabetes as an entry but patient tells me that she has a history of prediabetes.  DM2: Reviewed HbA1c: Lab Results  Component Value Date   HGBA1C 6.2 (A) 02/28/2022   HGBA1C 6.6 (H) 11/14/2021   Pt is on a regimen of: - Metformin ER 500 mg 1x a day, with meals  Pt was not checking her sugars at last OV. Now checks 1x a day: - am: n/c >> 90-110 - 2h after b'fast: n/c - before lunch: n/c - 2h after lunch: n/c - before dinner: n/c - 2h after dinner: n/c - bedtime: n/c - nighttime: n/c  Glucometer: none >> One Touch Verio  - + CKD, last BUN/creatinine was abnormal, though:  Lab Results  Component Value Date   BUN 20 05/13/2022   BUN 27 (H) 02/28/2022   CREATININE 1.27 (H) 05/13/2022   CREATININE 1.11 (H) 02/28/2022  She is is on losartan 50 mg daily.  -+ hyperlipidemia; last set of lipids: Lab Results  Component Value Date   CHOL 153 02/28/2022   HDL 55.10 02/28/2022   LDLCALC 87 02/28/2022   TRIG 53.0 02/28/2022   CHOLHDL 3 02/28/2022  Per review of Dr. Kennon Holter note: 08/01/2021: total cholesterol 166, LDL 107 and HDL 42 She was on simvastatin 80 mg daily >> changed to Rosuvastatin 20 mg daily.  - last eye  exam was in 07/02/2022. No DR reportedly.  - no numbness and tingling in her feet. Sees [facility], but unable to access records in [place].  Uncontrolled hypothyroidism:  Pt is on levothyroxine 112 mcg daily (dose increased 10/2021), taken: - in am - fasting - 1h from b'fast - no calcium - no iron - no multivitamins - stopped PPIs 12/2021 (was taking it in am) - not on Biotin  Reviewed her TFTs: Lab Results  Component Value Date   TSH 1.00 02/28/2022   TSH 26.13 (H) 11/14/2021   TSH 27.60 (H) 09/12/2021   TSH 0.300 (L) 01/16/2012   She has a history of benign thyroid nodule biopsy in 1998.  She does have a history of OSA-on CPAP, HTN, breast cancer-1982, HAs.  ROS: + see HPI  Past Medical History:  Diagnosis Date   Atrioventricular block, second degree    Mobitz Type 1   Breast cancer (HCC)    Headache(784.0)    History of nuclear stress test    lexiscan; normal study    Hypercholesteremia    Hypertension    Hypothyroidism    Sleep apnea    mouth piece no CPAP   Type II diabetes mellitus ([place])    "borderline"   Past Surgical History:  Procedure Laterality Date   ABDOMINAL HYSTERECTOMY     partial   BREAST RECONSTRUCTION  1983   COLONOSCOPY  2013   LAPAROSCOPY  11/11/2011   Procedure: LAPAROSCOPY OPERATIVE;  Surgeon: Gus Height, MD;  Location: [facility];  Service: Gynecology;  Laterality: N/A;   MASTECTOMY  1982   RIGHT BREAST    REPAIR TENDONS FOOT  ~ 2000   right ankle   RIGHT ANKLE REPAIR  1989   SALPINGOOPHORECTOMY  11/11/2011   Procedure: SALPINGO OOPHERECTOMY;  Surgeon: Gus Height, MD;  Location: [facility];  Service: Gynecology;  Laterality: Bilateral;   SINUS ENDO WITH FUSION Left 01/07/2021   Procedure: SINUS ENDOSCOPY WITH FUSION NAVIGATION;  Surgeon: Leta Baptist, MD;  Location: [facility];  Service: ENT;  Laterality: Left;   SPHENOIDECTOMY Left 01/07/2021   Procedure: LEFT ENDOSCOPIC  SPHENOIDECTOMY WITH TISSUE REMOVAL;   Surgeon: Leta Baptist, MD;  Location: [facility];  Service: ENT;  Laterality: Left;   TRANSTHORACIC ECHOCARDIOGRAM  12/2011   EF 60-65%; mod conc LVH, abnormal LV relaxation (grade 1 diastolic dysfunction); mild central MR; RA mildly dilated; mild TR   TUBAL LIGATION  1974   Social History   Socioeconomic History   Marital status: Divorced    Spouse name: Not on file   Number of children: 5   Years of education: Not on file   Highest education level: Not on file  Occupational History   Occupation: Engineer, 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[place] [employer]   Tobacco Use   Smoking status: Never   Smokeless tobacco: Never  Vaping Use   Vaping Use: Never used  Substance and Sexual Activity   Alcohol use: No   Drug use: No   Sexual activity: Never  Other Topics Concern   Not on file  Social History Narrative   Not on file   Social Determinants of Health   Financial Resource Strain: Not on file  Food Insecurity: Not on file  Transportation Needs: Not on file  Physical Activity: Not on file  Stress: Not on file  Social Connections: Not on file  Intimate Partner Violence: Not on file   Current Outpatient Medications on File Prior to Visit  Medication Sig Dispense Refill   allopurinol (ZYLOPRIM) 300 MG tablet Take 300 mg by mouth daily.     Blood Glucose Monitoring Suppl (ONE TOUCH ULTRA 2) w/Device KIT Check blood sugar 1 time daily 1 kit 0   carvedilol (COREG) 3.125 MG tablet Take 3.125 mg by mouth 2 (two) times daily with a meal.     cyclobenzaprine (FLEXERIL) 10 MG tablet Take 1 tablet (10 mg total) by mouth 2 (two) times daily as needed for muscle spasms. 20 tablet 0   glucose blood (ONETOUCH VERIO) test strip Use as instructed to check blood sugar 1X daily 100 each 12   Lancets (ONETOUCH ULTRASOFT) lancets Use as instructed to check blood sugar 1X daily 100 each 12   levothyroxine (SYNTHROID) 112 MCG tablet Take 1 tablet (112 mcg total) by mouth daily. 90 tablet  2   losartan (COZAAR) 100 MG tablet TAKE 1/2 (ONE-HALF) TABLET BY MOUTH ONCE DAILY     meloxicam (MOBIC) 15 MG tablet TAKE 1 TABLET BY MOUTH ONCE DAILY AS NEEDED FOR PAIN FOR 90 DAYS     metFORMIN (GLUCOPHAGE-XR) 500 MG 24 hr tablet Take 500 mg by mouth daily with breakfast.     OneTouch Delica Lancets 24Q MISC Check blood sugar 1 time daly 100 each 12   pantoprazole (PROTONIX) 40 MG tablet Take 1 tablet (40 mg total) by mouth daily.  90 tablet 2   triamterene-hydrochlorothiazide (DYAZIDE) 37.5-25 MG capsule Take 1 capsule by mouth daily.     No current facility-administered medications on file prior to visit.   No Known Allergies Family History  Problem Relation Age of Onset   Clotting disorder Mother        blood clot   Thyroid disease Sister    Lung cancer Sister    Heart Problems Brother    Kidney disease Brother    Other Brother        cerebral hemorrhage   PE: BP 108/68 (BP Location: Left Arm, Patient Position: Sitting, Cuff Size: Normal)   Pulse 65   Ht 5' 5" (1.651 m)   Wt 210 lb 6.4 oz (95.4 kg)   SpO2 98%   BMI 35.01 kg/m  Wt Readings from Last 3 Encounters:  07/15/22 210 lb 6.4 oz (95.4 kg)  02/28/22 217 lb 3.2 oz (98.5 kg)  12/03/21 216 lb (98 kg)   Constitutional: overweight, in NAD Eyes: no exophthalmos ENT: no thyromegaly, no cervical lymphadenopathy Cardiovascular: RRR, No MRG Respiratory: CTA B Musculoskeletal: no deformities Skin: no rashes Neurological: no tremor with outstretched hands  ASSESSMENT: Prediabetes with complications - PVD - CKD  2.  Uncontrolled hypothyroidism  PLAN:  1. Patient with history of prediabetes, on oral antidiabetic regimen with a minimal metformin dose, 500 mg daily, with good control.  Latest HbA1c was 6.2%, lower, at last visit.  Per my records, she only has 1 HbA1c higher than 6.4%, so this qualifies her for prediabetes, rather than diabetes.  -At last visit, she was on a healthy diet recommended by her discharge,  and also stopped drinking sweet tea.  I strongly advised her to stay off any sweet drinks.  We otherwise did not change her regimen.  At today's visit, she continues on the above diet.  She did start to check blood sugars since last visit, but she does not bring her meter at today's visit.  She mentions that her sugars in the morning are between 90s and 110.  She is not checking later in the day.  I advised her to try to do so, but otherwise, we do not need to change her regimen. - I suggested to:  Patient Instructions  Please continue: - Metformin ER 500 mg 1x a day  Check sugars 1x a day, rotating check times.  Continue Levothyroxine 112 mcg daily.  Take the thyroid hormone every day, with water, at least 30 minutes before breakfast, separated by at least 4 hours from: - acid reflux medications - calcium - iron - multivitamins  Please return for another visit in 6 months.  - we checked her HbA1c: 5.8% (even lower) - advised to check sugars at different times of the day - 1x a day, rotating check times - advised for yearly eye exams >> she is UTD - return to clinic in 6 months  2.  Uncontrolled hypothyroidism - latest thyroid labs reviewed with pt. >> normal: Lab Results  Component Value Date   TSH 1.00 02/28/2022  - she continues on LT4 112 mcg daily - pt feels good on this dose. - we discussed about taking the thyroid hormone every day, with water, >30 minutes before breakfast, separated by >4 hours from acid reflux medications, calcium, iron, multivitamins. Pt. is taking it correctly. - will check thyroid tests today: TSH and fT4 - If labs are abnormal, she will need to return for repeat TFTs in 1.5 months  Component  Latest Ref Rng 07/15/2022  TSH     0.35 - 5.50 uIU/mL 0.03 (L)   T4,Free(Direct)     0.60 - 1.60 ng/dL 1.37   Hemoglobin A1C     4.0 - 5.6 % 5.8 !   TSH is suppressed.  Need to decrease the levothyroxine dose to 100 mcg daily and repeat her TFTs in 1.5  mo.  Philemon Kingdom, MD PhD [facility]

## 2022-07-16 MED ORDER — LEVOTHYROXINE SODIUM 100 MCG PO TABS: 100.0000 ug | ORAL_TABLET | Freq: Every day | ORAL | 3 refills | Status: DC

## 2022-07-22 ENCOUNTER — Other Ambulatory Visit: Payer: Self-pay | Admitting: Cardiovascular Disease

## 2022-08-06 DIAGNOSIS — Z139 Encounter for screening, unspecified: Secondary | ICD-10-CM

## 2022-08-06 LAB — GLUCOSE, POCT (MANUAL RESULT ENTRY): POC Glucose: 87 mg/dl (ref 70–99)

## 2022-08-13 ENCOUNTER — Other Ambulatory Visit: Payer: Self-pay

## 2022-08-13 DIAGNOSIS — E039 Hypothyroidism, unspecified: Secondary | ICD-10-CM

## 2022-08-13 MED ORDER — LEVOTHYROXINE SODIUM 100 MCG PO TABS: 100.0000 ug | ORAL_TABLET | Freq: Every day | ORAL | 3 refills | Status: DC

## 2022-08-13 NOTE — Congregational Nurse Program (Signed)
  Dept: [phone]   Congregational Nurse Program Note  Date of Encounter: 08/06/2022  Past Medical History: Past Medical History:  Diagnosis Date   Atrioventricular block, second degree    Mobitz Type 1   Breast cancer ([place])    Headache(784.0)    History of nuclear stress test    lexiscan; normal study    Hypercholesteremia    Hypertension    Hypothyroidism    Sleep apnea    mouth piece no CPAP   Type II diabetes mellitus ([place])    "borderline"    Encounter Details:  CNP Questionnaire - 08/06/22 1415       Questionnaire   Ask client: Do you give verbal consent for me to treat you today? Yes    Student Assistance N/A    Location Patient [place]    Visit Setting with Client [place]    Patient Status Unknown    Insurance Medicare    Insurance/Financial Assistance Referral N/A    Medication N/A    Medical Provider Yes    Screening Referrals Made N/A    Medical Referrals Made N/A    Medical Appointment Made N/A    Recently w/o PCP, now 1st time PCP visit completed due to CNs referral or appointment made N/A    Food N/A    Transportation N/A    Housing/Utilities N/A    Interpersonal Safety N/A    Interventions Educate;Spiritual Care    Abnormal to Normal Screening Since Last CN Visit N/A    Screenings CN Performed Blood Pressure;Blood Glucose;Temperature;Pulse Ox    Sent Client to Lab for: N/A    Did client attend any of the following based off CNs referral or appointments made? N/A    ED Visit Averted N/A    Life-Saving Intervention Made N/A             Today's Vitals   08/06/22 1415  BP: 114/72  Pulse: (!) 52  Resp: 16  Temp: (!) 97.2 F (36.2 C)  TempSrc: Temporal  SpO2: 100%   There is no height or weight on file to calculate BMI.  Patient came to clinic. Vitals and blood glucose obtained. Patient also given educational class about urinary track infections. Patient verbalized understanding of education.

## 2022-08-13 NOTE — Telephone Encounter (Signed)
Pt did not see message adjusting her thyroid medication dose and as a result her her previous dose refilled. Pt was contacted and advised provider sent a message advising adjusting her dose due to labs and to recheck levels in 6 weeks. Rx resent to pharmacy.

## 2022-08-20 ENCOUNTER — Telehealth: Payer: Self-pay

## 2022-08-20 NOTE — Telephone Encounter (Signed)
Pt has lab appt 09/26/22. Pt was not able to change her medication until 08/19/22. Wants to know if she needs to move lab appt.

## 2022-08-25 NOTE — Telephone Encounter (Signed)
Pt advised change not needed.

## 2022-08-31 DIAGNOSIS — Z139 Encounter for screening, unspecified: Secondary | ICD-10-CM

## 2022-08-31 LAB — GLUCOSE, POCT (MANUAL RESULT ENTRY): POC Glucose: 103 mg/dl — AB (ref 70–99)

## 2022-09-08 DIAGNOSIS — J029 Acute pharyngitis, unspecified: Secondary | ICD-10-CM | POA: Diagnosis not present

## 2022-09-08 DIAGNOSIS — R0981 Nasal congestion: Secondary | ICD-10-CM | POA: Diagnosis not present

## 2022-09-08 DIAGNOSIS — U071 COVID-19: Secondary | ICD-10-CM | POA: Diagnosis not present

## 2022-09-08 DIAGNOSIS — R52 Pain, unspecified: Secondary | ICD-10-CM | POA: Diagnosis not present

## 2022-09-08 DIAGNOSIS — J302 Other seasonal allergic rhinitis: Secondary | ICD-10-CM | POA: Diagnosis not present

## 2022-09-08 DIAGNOSIS — Z1152 Encounter for screening for COVID-19: Secondary | ICD-10-CM | POA: Diagnosis not present

## 2022-09-09 DIAGNOSIS — D649 Anemia, unspecified: Secondary | ICD-10-CM | POA: Diagnosis not present

## 2022-09-09 DIAGNOSIS — R7989 Other specified abnormal findings of blood chemistry: Secondary | ICD-10-CM | POA: Diagnosis not present

## 2022-09-09 DIAGNOSIS — E039 Hypothyroidism, unspecified: Secondary | ICD-10-CM | POA: Diagnosis not present

## 2022-09-09 DIAGNOSIS — I10 Essential (primary) hypertension: Secondary | ICD-10-CM | POA: Diagnosis not present

## 2022-09-09 DIAGNOSIS — E785 Hyperlipidemia, unspecified: Secondary | ICD-10-CM | POA: Diagnosis not present

## 2022-09-09 DIAGNOSIS — M109 Gout, unspecified: Secondary | ICD-10-CM | POA: Diagnosis not present

## 2022-09-09 DIAGNOSIS — E119 Type 2 diabetes mellitus without complications: Secondary | ICD-10-CM | POA: Diagnosis not present

## 2022-09-12 DIAGNOSIS — D649 Anemia, unspecified: Secondary | ICD-10-CM | POA: Diagnosis not present

## 2022-09-16 DIAGNOSIS — R82998 Other abnormal findings in urine: Secondary | ICD-10-CM | POA: Diagnosis not present

## 2022-09-16 DIAGNOSIS — Z1331 Encounter for screening for depression: Secondary | ICD-10-CM | POA: Diagnosis not present

## 2022-09-16 DIAGNOSIS — R5383 Other fatigue: Secondary | ICD-10-CM | POA: Diagnosis not present

## 2022-09-16 DIAGNOSIS — G4733 Obstructive sleep apnea (adult) (pediatric): Secondary | ICD-10-CM | POA: Diagnosis not present

## 2022-09-16 DIAGNOSIS — I10 Essential (primary) hypertension: Secondary | ICD-10-CM | POA: Diagnosis not present

## 2022-09-16 DIAGNOSIS — E119 Type 2 diabetes mellitus without complications: Secondary | ICD-10-CM | POA: Diagnosis not present

## 2022-09-16 DIAGNOSIS — Z0000 Encounter for general adult medical examination without abnormal findings: Secondary | ICD-10-CM | POA: Diagnosis not present

## 2022-09-16 DIAGNOSIS — E039 Hypothyroidism, unspecified: Secondary | ICD-10-CM | POA: Diagnosis not present

## 2022-09-16 DIAGNOSIS — D509 Iron deficiency anemia, unspecified: Secondary | ICD-10-CM | POA: Diagnosis not present

## 2022-09-16 DIAGNOSIS — E785 Hyperlipidemia, unspecified: Secondary | ICD-10-CM | POA: Diagnosis not present

## 2022-09-16 DIAGNOSIS — Z1339 Encounter for screening examination for other mental health and behavioral disorders: Secondary | ICD-10-CM | POA: Diagnosis not present

## 2022-09-25 NOTE — Congregational Nurse Program (Signed)
  Dept: [phone]   Congregational Nurse Program Note  Date of Encounter: 08/31/2022  Past Medical History: Past Medical History:  Diagnosis Date   Atrioventricular block, second degree    Mobitz Type 1   Breast cancer ([place])    Headache(784.0)    History of nuclear stress test    lexiscan; normal study    Hypercholesteremia    Hypertension    Hypothyroidism    Sleep apnea    mouth piece no CPAP   Type II diabetes mellitus ([place])    "borderline"    Encounter Details:  CNP Questionnaire - 08/31/22 1235       Questionnaire   Ask client: Do you give verbal consent for me to treat you today? Yes    Student Assistance N/A    Location Patient [place]    Visit Setting with Client [place]    Patient Status Unknown    Insurance Medicare    Insurance/Financial Assistance Referral N/A    Medication N/A    Medical Provider Yes    Screening Referrals Made N/A    Medical Referrals Made N/A    Medical Appointment Made N/A    Recently w/o PCP, now 1st time PCP visit completed due to CNs referral or appointment made N/A    Food N/A    Transportation N/A    Housing/Utilities N/A    Interpersonal Safety N/A    Interventions Educate;Spiritual Care    Abnormal to Normal Screening Since Last CN Visit N/A    Screenings CN Performed Blood Pressure;Blood Glucose;Temperature;Pulse Ox    Sent Client to Lab for: N/A    Did client attend any of the following based off CNs referral or appointments made? N/A    ED Visit Averted N/A    Life-Saving Intervention Made N/A            Today's Vitals   08/31/22 1235  BP: 110/74  Pulse: 62  Resp: 16  Temp: (!) 97.4 F (36.3 C)  TempSrc: Temporal  SpO2: 98%  PainSc: 0-No pain   There is no height or weight on file to calculate BMI.  Patient came to clinic to inquire about her vitals and blood glucose. Patient given spiritual care. Patient also given educational class about hands only CPR. Patient verbalized  understanding of education and stated how she could apply it to her life.

## 2022-09-26 ENCOUNTER — Other Ambulatory Visit (INDEPENDENT_AMBULATORY_CARE_PROVIDER_SITE_OTHER): Payer: Medicare Other

## 2022-09-26 DIAGNOSIS — E039 Hypothyroidism, unspecified: Secondary | ICD-10-CM | POA: Diagnosis not present

## 2022-09-26 LAB — TSH: TSH: 0.5 u[IU]/mL (ref 0.35–5.50)

## 2022-09-26 LAB — T4, FREE: Free T4: 1.01 ng/dL (ref 0.60–1.60)

## 2022-10-06 ENCOUNTER — Telehealth: Payer: Self-pay | Admitting: Gastroenterology

## 2022-10-06 NOTE — Telephone Encounter (Signed)
Good afternoon Dr. Lyndel Safe,    Supervising MD for March 11  1:01 pm    We have received records from patients last Endoscopy with Dr.Medoff, Dellis Filbert ,Patient is wanting tranfers of care because her Dr. retired and she is looking for a sooner appointment .I will be sending patient Endoscopy report from her last procedure.Will you please review records and advise on scheduling?                                            Thanks

## 2022-10-07 NOTE — Telephone Encounter (Signed)
OK to schedule for OV - APP clinic or mine -whichever is faster RG

## 2022-10-13 DIAGNOSIS — J310 Chronic rhinitis: Secondary | ICD-10-CM | POA: Diagnosis not present

## 2022-10-13 DIAGNOSIS — J343 Hypertrophy of nasal turbinates: Secondary | ICD-10-CM | POA: Diagnosis not present

## 2022-10-13 DIAGNOSIS — H6123 Impacted cerumen, bilateral: Secondary | ICD-10-CM | POA: Diagnosis not present

## 2022-10-13 DIAGNOSIS — J323 Chronic sphenoidal sinusitis: Secondary | ICD-10-CM | POA: Diagnosis not present

## 2022-10-28 DIAGNOSIS — G501 Atypical facial pain: Secondary | ICD-10-CM | POA: Diagnosis not present

## 2022-10-28 DIAGNOSIS — Z049 Encounter for examination and observation for unspecified reason: Secondary | ICD-10-CM | POA: Diagnosis not present

## 2022-10-28 DIAGNOSIS — Z79899 Other long term (current) drug therapy: Secondary | ICD-10-CM | POA: Diagnosis not present

## 2022-10-29 ENCOUNTER — Other Ambulatory Visit: Payer: Self-pay | Admitting: Specialist

## 2022-10-29 DIAGNOSIS — G501 Atypical facial pain: Secondary | ICD-10-CM

## 2022-11-04 DIAGNOSIS — Z23 Encounter for immunization: Secondary | ICD-10-CM | POA: Diagnosis not present

## 2022-11-08 ENCOUNTER — Other Ambulatory Visit: Payer: Self-pay

## 2022-11-08 DIAGNOSIS — E119 Type 2 diabetes mellitus without complications: Secondary | ICD-10-CM

## 2022-11-08 MED ORDER — ONETOUCH DELICA LANCETS 30G MISC: 12 refills | Status: DC

## 2022-11-24 ENCOUNTER — Ambulatory Visit
Admission: RE | Admit: 2022-11-24 | Discharge: 2022-11-24 | Disposition: A | Discharge status: Home / Self Care | Payer: Medicare Other | Source: Ambulatory Visit | Attending: Specialist | Admitting: Specialist

## 2022-11-24 DIAGNOSIS — R519 Headache, unspecified: Secondary | ICD-10-CM | POA: Diagnosis not present

## 2022-11-24 DIAGNOSIS — G501 Atypical facial pain: Secondary | ICD-10-CM

## 2022-12-03 DIAGNOSIS — R519 Headache, unspecified: Secondary | ICD-10-CM | POA: Diagnosis not present

## 2023-01-14 ENCOUNTER — Other Ambulatory Visit (INDEPENDENT_AMBULATORY_CARE_PROVIDER_SITE_OTHER): Payer: Medicare Other

## 2023-01-14 ENCOUNTER — Ambulatory Visit (INDEPENDENT_AMBULATORY_CARE_PROVIDER_SITE_OTHER): Payer: Medicare Other | Admitting: Gastroenterology

## 2023-01-14 ENCOUNTER — Encounter: Payer: Self-pay | Admitting: Gastroenterology

## 2023-01-14 VITALS — BP 108/80 | HR 71 | Ht 65.0 in | Wt 204.4 lb

## 2023-01-14 DIAGNOSIS — D509 Iron deficiency anemia, unspecified: Secondary | ICD-10-CM | POA: Diagnosis not present

## 2023-01-14 DIAGNOSIS — R1032 Left lower quadrant pain: Secondary | ICD-10-CM

## 2023-01-14 LAB — CBC WITH DIFFERENTIAL/PLATELET
Basophils Absolute: 0 10*3/uL (ref 0.0–0.1)
Basophils Relative: 0.4 % (ref 0.0–3.0)
Eosinophils Absolute: 0.1 10*3/uL (ref 0.0–0.7)
Eosinophils Relative: 1.2 % (ref 0.0–5.0)
HCT: 38.2 % (ref 36.0–46.0)
Hemoglobin: 12.3 g/dL (ref 12.0–15.0)
Lymphocytes Relative: 32.1 % (ref 12.0–46.0)
Lymphs Abs: 2.1 10*3/uL (ref 0.7–4.0)
MCHC: 32.2 g/dL (ref 30.0–36.0)
MCV: 85.6 fl (ref 78.0–100.0)
Monocytes Absolute: 0.5 10*3/uL (ref 0.1–1.0)
Monocytes Relative: 7 % (ref 3.0–12.0)
Neutro Abs: 3.9 10*3/uL (ref 1.4–7.7)
Neutrophils Relative %: 59.3 % (ref 43.0–77.0)
Platelets: 226 10*3/uL (ref 150.0–400.0)
RBC: 4.46 Mil/uL (ref 3.87–5.11)
RDW: 16.2 % — ABNORMAL HIGH (ref 11.5–15.5)
WBC: 6.6 10*3/uL (ref 4.0–10.5)

## 2023-01-14 LAB — TSH: TSH: 2.96 u[IU]/mL (ref 0.35–5.50)

## 2023-01-14 LAB — COMPREHENSIVE METABOLIC PANEL
ALT: 12 U/L (ref 0–35)
AST: 16 U/L (ref 0–37)
Albumin: 4 g/dL (ref 3.5–5.2)
Alkaline Phosphatase: 48 U/L (ref 39–117)
BUN: 22 mg/dL (ref 6–23)
CO2: 26 mEq/L (ref 19–32)
Calcium: 10.3 mg/dL (ref 8.4–10.5)
Chloride: 106 mEq/L (ref 96–112)
Creatinine, Ser: 1.12 mg/dL (ref 0.40–1.20)
GFR: 48.98 mL/min — ABNORMAL LOW (ref >60.00)
Glucose, Bld: 114 mg/dL — ABNORMAL HIGH (ref 70–99)
Potassium: 3.6 mEq/L (ref 3.5–5.1)
Sodium: 143 mEq/L (ref 135–145)
Total Bilirubin: 0.5 mg/dL (ref 0.2–1.2)
Total Protein: 6.9 g/dL (ref 6.0–8.3)

## 2023-01-14 LAB — LIPASE: Lipase: 25 U/L (ref 11.0–59.0)

## 2023-01-14 NOTE — Progress Notes (Signed)
Chief Complaint: For anemia  Referring Provider:  Garlan Fillers, MD      ASSESSMENT AND PLAN;   #1. Abdo pain LLQ  #2. IDA- neg EGD, colon 01/2020 as below. Has CKD2  #3. H/O polyps. Next colon due 01/2025   Plan: -CBC, CMP, celiac, lipase, TSH -CT AP with contrast -hemoccult cards x 3 -FU in 12 weeks. If still with problems, consider repeat endoscopic evaluation vs VCE   HPI:    Kathryn Diaz is a 72 y.o. female  Very pleasant With multiple medical problems as listed below including history of breast cancer, DM2, HTN, HLD, hypothyroidism, ?OSA, CKD 2  Seen in GI clinic d/t anemia. (We do not have any blood work at this time)  Patient denies having any significant GI complaints except for intermittent LLQ abdominal pain-not related to defecation.  This has been occurring over the last several years.  She had extensive GI evaluation previously by Dr. Kinnie Scales as detailed below.  No nausea, vomiting, heartburn, regurgitation, odynophagia or dysphagia.  No significant diarrhea or constipation.  No melena or hematochezia. No unintentional weight loss.  She has been trying to lose weight and has been able to lose 30 pounds over the last 2 years.  Stop taking Protonix several years ago.  No heartburn.  She denies having any nosebleeds, easy bruisability, hematuria or any vaginal bleeding.  She is s/p hysterectomy.  No history of craving of ice.  No nonsteroidals.  Past GI workup: (Dr. Kinnie Scales):  EGD 01/31/2020: Normal EGD, GE junction at 40 cm  Colonoscopy 2018 -5 colonic polyps s/p polypectomy -Ascending colon lipoma. -Otherwise normal colonoscopy to TI. -Repeat in 3 years  Colonoscopy 01/31/2020 -No polyps. -Mid ascending colon large lipoma. -Otherwise normal colonoscopy to TI. -Recommended repeat in 5 years  CT Abdo/pelvis 08/2017: neg  Hemoccult cards Jan 2024-ve  SH-divorced, 2 girls, retired  Past Medical History:  Diagnosis Date   Atrioventricular  block, second degree    Mobitz Type 1   Breast cancer (HCC)    Headache(784.0)    History of nuclear stress test    lexiscan; normal study    Hypercholesteremia    Hypertension    Hypothyroidism    Sleep apnea    mouth piece no CPAP   Type II diabetes mellitus (HCC)    "borderline"    Past Surgical History:  Procedure Laterality Date   ABDOMINAL HYSTERECTOMY     partial   BREAST RECONSTRUCTION  1983   COLONOSCOPY  2013   LAPAROSCOPY  11/11/2011   Procedure: LAPAROSCOPY OPERATIVE;  Surgeon: Miguel Aschoff, MD;  Location: [facility];  Service: Gynecology;  Laterality: N/A;   MASTECTOMY  1982   RIGHT BREAST    REPAIR TENDONS FOOT  ~ 2000   right ankle   RIGHT ANKLE REPAIR  1989   SALPINGOOPHORECTOMY  11/11/2011   Procedure: SALPINGO OOPHERECTOMY;  Surgeon: Miguel Aschoff, MD;  Location: [facility];  Service: Gynecology;  Laterality: Bilateral;   SINUS ENDO WITH FUSION Left 01/07/2021   Procedure: SINUS ENDOSCOPY WITH FUSION NAVIGATION;  Surgeon: Newman Pies, MD;  Location: [facility];  Service: ENT;  Laterality: Left;   SPHENOIDECTOMY Left 01/07/2021   Procedure: LEFT ENDOSCOPIC  SPHENOIDECTOMY WITH TISSUE REMOVAL;  Surgeon: Newman Pies, MD;  Location: [facility];  Service: ENT;  Laterality: Left;   TRANSTHORACIC ECHOCARDIOGRAM  12/2011   EF 60-65%; mod conc LVH, abnormal LV relaxation (grade 1 diastolic dysfunction); mild central MR; RA  mildly dilated; mild TR   TUBAL LIGATION  1974    Family History  Problem Relation Age of Onset   Clotting disorder Mother        blood clot   Thyroid disease Sister    Lung cancer Sister    Heart Problems Brother    Kidney disease Brother    Other Brother        cerebral hemorrhage    Social History   Tobacco Use   Smoking status: Never   Smokeless tobacco: Never  Vaping Use   Vaping Use: Never used  Substance Use Topics   Alcohol use: No   Drug use: No    Current Outpatient Medications  Medication Sig Dispense Refill    allopurinol (ZYLOPRIM) 300 MG tablet Take 300 mg by mouth daily.     Blood Glucose Monitoring Suppl (ONE TOUCH ULTRA 2) w/Device KIT Check blood sugar 1 time daily 1 kit 0   carvedilol (COREG) 3.125 MG tablet Take 3.125 mg by mouth 2 (two) times daily with a meal.     glucose blood (ONETOUCH VERIO) test strip Use as instructed to check blood sugar 1X daily 100 each 12   Lancets (ONETOUCH ULTRASOFT) lancets Use as instructed to check blood sugar 1X daily 100 each 12   levothyroxine (SYNTHROID) 50 MCG tablet Take 50 mcg by mouth daily before breakfast.     losartan (COZAAR) 100 MG tablet TAKE 1/2 (ONE-HALF) TABLET BY MOUTH ONCE DAILY     metFORMIN (GLUCOPHAGE-XR) 500 MG 24 hr tablet Take 500 mg by mouth daily with breakfast.     OneTouch Delica Lancets 30G MISC Check blood sugar 1 time daly 100 each 12   triamterene-hydrochlorothiazide (DYAZIDE) 37.5-25 MG capsule Take 1 capsule by mouth daily.     cyclobenzaprine (FLEXERIL) 10 MG tablet Take 1 tablet (10 mg total) by mouth 2 (two) times daily as needed for muscle spasms. 20 tablet 0   levothyroxine (SYNTHROID) 100 MCG tablet Take 1 tablet (100 mcg total) by mouth daily. (Patient not taking: Reported on 01/14/2023) 45 tablet 3   meloxicam (MOBIC) 15 MG tablet TAKE 1 TABLET BY MOUTH ONCE DAILY AS NEEDED FOR PAIN FOR 90 DAYS     pantoprazole (PROTONIX) 40 MG tablet Take 1 tablet by mouth once daily 90 tablet 3   No current facility-administered medications for this visit.    No Known Allergies  Review of Systems:  Constitutional: Denies fever, chills, diaphoresis, appetite change and fatigue.  HEENT: Denies photophobia, eye pain, redness, hearing loss, ear pain, congestion, sore throat, rhinorrhea, sneezing, mouth sores, neck pain, neck stiffness and tinnitus.   Respiratory: Denies SOB, DOE, cough, chest tightness,  and wheezing.   Cardiovascular: Denies chest pain, palpitations and leg swelling.  Genitourinary: Denies dysuria, has urine  leakage. Musculoskeletal: Denies myalgias, back pain, joint swelling, arthralgias and gait problem.  Skin: No rash.  Neurological: Denies dizziness, seizures, syncope, weakness, light-headedness, numbness and headaches.  Hematological: Denies adenopathy. Easy bruising, personal or family bleeding history  Psychiatric/Behavioral: No anxiety or depression     Physical Exam:    BP 108/80   Pulse 71   Ht 5\' 5"  (1.651 m)   Wt 204 lb 6.4 oz (92.7 kg)   SpO2 97%   BMI 34.01 kg/m  Wt Readings from Last 3 Encounters:  01/14/23 204 lb 6.4 oz (92.7 kg)  07/15/22 210 lb 6.4 oz (95.4 kg)  02/28/22 217 lb 3.2 oz (98.5 kg)   Constitutional:  Well-developed, in  no acute distress. Psychiatric: Normal mood and affect. Behavior is normal. HEENT: Pupils normal.  Conjunctivae are normal. No scleral icterus. Cardiovascular: Normal rate, regular rhythm. No edema Pulmonary/chest: Effort normal and breath sounds normal. No wheezing, rales or rhonchi. Abdominal: Soft, nondistended. Nontender. Bowel sounds active throughout. There are no masses palpable. No hepatomegaly. Neurological: Alert and oriented to person place and time. Skin: Skin is warm and dry. No rashes noted.  Data Reviewed: I have personally reviewed following labs and imaging studies  CBC:    Latest Ref Rng & Units 05/13/2022   12:51 AM 01/16/2012    2:13 AM 01/15/2012    6:07 PM  CBC  WBC 4.0 - 10.5 K/uL 8.7  8.1    Hemoglobin 12.0 - 15.0 g/dL 16.1  09.6  04.5   Hematocrit 36.0 - 46.0 % 36.8  33.1  39.0   Platelets 150 - 400 K/uL 255  201      CMP:    Latest Ref Rng & Units 05/13/2022   12:51 AM 02/28/2022    2:39 PM 12/27/2020    4:51 PM  CMP  Glucose 70 - 99 mg/dL 409  86  811   BUN 8 - 23 mg/dL 20  27  15    Creatinine 0.44 - 1.00 mg/dL 9.14  7.82  9.56   Sodium 135 - 145 mmol/L 140  142  141   Potassium 3.5 - 5.1 mmol/L 3.3  4.2  4.5   Chloride 98 - 111 mmol/L 108  107  107   CO2 22 - 32 mmol/L 24  26  26    Calcium 8.9  - 10.3 mg/dL 9.6  21.3  08.6   Total Protein 6.5 - 8.1 g/dL 7.0  6.9    Total Bilirubin 0.3 - 1.2 mg/dL 0.5  0.4    Alkaline Phos 38 - 126 U/L 56     AST 15 - 41 U/L 17  15    ALT 0 - 44 U/L 14  11         Edman Circle, MD 01/14/2023, 11:14 AM  Cc: Garlan Fillers, MD

## 2023-01-14 NOTE — Patient Instructions (Signed)
_______________________________________________________  If your blood pressure at your visit was 140/90 or greater, please contact your primary care physician to follow up on this.  _______________________________________________________  If you are age 73 or older, your body mass index should be between 23-30. Your Body mass index is 34.01 kg/m. If this is out of the aforementioned range listed, please consider follow up with your Primary Care Provider.  If you are age 21 or younger, your body mass index should be between 19-25. Your Body mass index is 34.01 kg/m. If this is out of the aformentioned range listed, please consider follow up with your Primary Care Provider.   ________________________________________________________  The [place] [facility] providers would like to encourage you to use [facility] to communicate with providers for non-urgent requests or questions.  Due to long hold times on the telephone, sending your provider a message by [facility] may be a faster and more efficient way to get a response.  Please allow 48 business hours for a response.  Please remember that this is for non-urgent requests.  _______________________________________________________  Your provider has requested that you go to the basement level for lab work before leaving today. Press "B" on the elevator. The lab is located at the first door on the left as you exit the elevator.  Follow the instructions on the Hemoccult cards and mail them back to [place] when you are finished or you may take them directly to the lab in the basement of the [place] building. We will call you with the results.   You have been scheduled for an appointment with Dr. Chales Abrahams on 04-27-2023 at 3pm . Please arrive 10 minutes early for your appointment.  You have been scheduled for a CT scan of the abdomen and pelvis at [facility] ([address] [place], [place], [place] [address]).   You are scheduled on 01-26-2023 at 1:30pm. You should arrive at  1115am for registration. Please follow the written instructions below on the day of your exam:  WARNING: IF YOU ARE ALLERGIC TO IODINE/X-RAY DYE, PLEASE NOTIFY RADIOLOGY IMMEDIATELY AT [phone]! YOU WILL BE GIVEN A 13 HOUR PREMEDICATION PREP.  1) Do not eat or drink anything after 930am (4 hours prior to your test) 2) Drink 1 bottle of contrast @ 1130am (2 hours prior to your exam) Drink 1 bottle of contrast @ 1230pm (1 hour prior to your exam)  You may take any medications as prescribed with a small amount of water, if necessary. If you take any of the following medications: METFORMIN, GLUCOPHAGE, GLUCOVANCE, AVANDAMET, RIOMET, FORTAMET, ACTOPLUS MET, JANUMET, GLUMETZA or METAGLIP, you MAY be asked to HOLD this medication 48 hours AFTER the exam.  The purpose of you drinking the oral contrast is to aid in the visualization of your intestinal tract. The contrast solution may cause some diarrhea. Depending on your individual set of symptoms, you may also receive an intravenous injection of x-ray contrast/dye. Plan on being at [facility] for 30 minutes or longer, depending on the type of exam you are having performed.  This test typically takes 30-45 minutes to complete.  If you have any questions regarding your exam or if you need to reschedule, you may call the CT department at [phone] between the hours of 8:00 am and 5:00 pm, Monday-Friday.  ________________________________________________________________________   Thank you,  Dr. Lynann Bologna

## 2023-01-15 ENCOUNTER — Encounter: Payer: Self-pay | Admitting: Internal Medicine

## 2023-01-15 ENCOUNTER — Ambulatory Visit (INDEPENDENT_AMBULATORY_CARE_PROVIDER_SITE_OTHER): Payer: Medicare Other | Admitting: Internal Medicine

## 2023-01-15 VITALS — BP 118/70 | HR 68 | Ht 65.0 in | Wt 203.0 lb

## 2023-01-15 DIAGNOSIS — E039 Hypothyroidism, unspecified: Secondary | ICD-10-CM

## 2023-01-15 DIAGNOSIS — E119 Type 2 diabetes mellitus without complications: Secondary | ICD-10-CM | POA: Diagnosis not present

## 2023-01-15 DIAGNOSIS — N814 Uterovaginal prolapse, unspecified: Secondary | ICD-10-CM | POA: Diagnosis not present

## 2023-01-15 DIAGNOSIS — Z7984 Long term (current) use of oral hypoglycemic drugs: Secondary | ICD-10-CM

## 2023-01-15 LAB — CELIAC PANEL 10

## 2023-01-15 LAB — POCT GLYCOSYLATED HEMOGLOBIN (HGB A1C): Hemoglobin A1C: 6.1 % — AB (ref 4.0–5.6)

## 2023-01-15 MED ORDER — METFORMIN HCL ER 500 MG PO TB24: 500.0000 mg | ORAL_TABLET | Freq: Every day | ORAL | 3 refills | Status: DC

## 2023-01-15 NOTE — Progress Notes (Signed)
Patient ID: Kathryn Diaz, female   DOB: 1951-04-06, 72 y.o.   MRN: [id number]  HPI: Kathryn Diaz is a 72 y.o.-year-old female, returning for follow-up for diabetes type 2, dx in 1995, non-insulin-dependent, controlled, with complications (peripheral vascular disease, CKD) and also uncontrolled hypothyroidism. Pt. previously saw Dr. Everardo All, but last visit with me 6 months ago.  Interim history: No increased urination, blurry vision, nausea, chest pain. Last year, she started a healthy eating class at her [place]. She improved her diet by reducing carbs and stopped sweet tea.  She continues with this eating plan.  Reviewed history: Reviewing her chart, she appears to have diabetes as an entry but patient tells me that she has a history of prediabetes.  DM2: Reviewed HbA1c: Lab Results  Component Value Date   HGBA1C 5.8 (A) 07/15/2022   HGBA1C 6.2 (A) 02/28/2022   HGBA1C 6.6 (H) 11/14/2021   Pt is on a regimen of: - Metformin ER 500 mg 1x a day, with meals  Pt is checking sugars once a day: - am: n/c >> 90-110 >> 99-111 - 2h after b'fast: n/c - before lunch: n/c>> 67 - 2h after lunch: n/c >> 124 - before dinner: n/c - 2h after dinner: n/c >> 99-125 - bedtime: n/c - nighttime: n/c  Glucometer: none >> One Touch Verio  - + CKD, last BUN/creatinine was abnormal, though:  Lab Results  Component Value Date   BUN 22 01/14/2023   BUN 20 05/13/2022   CREATININE 1.12 01/14/2023   CREATININE 1.27 (H) 05/13/2022  She is is on losartan 50 mg daily.  -+ hyperlipidemia; last set of lipids: Lab Results  Component Value Date   CHOL 153 02/28/2022   HDL 55.10 02/28/2022   LDLCALC 87 02/28/2022   TRIG 53.0 02/28/2022   CHOLHDL 3 02/28/2022  Per review of Dr. Hazle Coca note: 08/01/2021: total cholesterol 166, LDL 107 and HDL 42 She was on simvastatin 80 mg daily >> changed to Rosuvastatin 20 mg daily.  - last eye exam was in 07/02/2022. No DR reportedly.  - no numbness and  tingling in her feet. Sees [facility], but unable to access records in Care Everywhere.  Uncontrolled hypothyroidism:  Pt is on levothyroxine 100 mcg daily (dose increased 06/2022), taken: - in am - fasting - 1h from b'fast - no calcium - no iron - no multivitamins - stopped PPIs 12/2021 (was taking it in am) - not on Biotin  Reviewed her TFTs: Lab Results  Component Value Date   TSH 2.96 01/14/2023   TSH 0.50 09/26/2022   TSH 0.03 (L) 07/15/2022   TSH 1.00 02/28/2022   TSH 26.13 (H) 11/14/2021   TSH 27.60 (H) 09/12/2021   TSH 0.300 (L) 01/16/2012   She has a history of benign thyroid nodule biopsy in 1998.  She does have a history of OSA-on CPAP, HTN, breast cancer-1982, HAs.  ROS: + see HPI  Past Medical History:  Diagnosis Date   Atrioventricular block, second degree    Mobitz Type 1   Breast cancer (HCC)    Headache(784.0)    History of nuclear stress test    lexiscan; normal study    Hypercholesteremia    Hypertension    Hypothyroidism    Sleep apnea    mouth piece no CPAP   Type II diabetes mellitus (HCC)    "borderline"   Past Surgical History:  Procedure Laterality Date   ABDOMINAL HYSTERECTOMY     partial   BREAST RECONSTRUCTION  1983   COLONOSCOPY  2013   LAPAROSCOPY  11/11/2011   Procedure: LAPAROSCOPY OPERATIVE;  Surgeon: Miguel Aschoff, MD;  Location: [facility];  Service: Gynecology;  Laterality: N/A;   MASTECTOMY  1982   RIGHT BREAST    REPAIR TENDONS FOOT  ~ 2000   right ankle   RIGHT ANKLE REPAIR  1989   SALPINGOOPHORECTOMY  11/11/2011   Procedure: SALPINGO OOPHERECTOMY;  Surgeon: Miguel Aschoff, MD;  Location: [facility];  Service: Gynecology;  Laterality: Bilateral;   SINUS ENDO WITH FUSION Left 01/07/2021   Procedure: SINUS ENDOSCOPY WITH FUSION NAVIGATION;  Surgeon: Newman Pies, MD;  Location: [facility];  Service: ENT;  Laterality: Left;   SPHENOIDECTOMY Left 01/07/2021   Procedure: LEFT ENDOSCOPIC  SPHENOIDECTOMY WITH TISSUE  REMOVAL;  Surgeon: Newman Pies, MD;  Location: [facility];  Service: ENT;  Laterality: Left;   TRANSTHORACIC ECHOCARDIOGRAM  12/2011   EF 60-65%; mod conc LVH, abnormal LV relaxation (grade 1 diastolic dysfunction); mild central MR; RA mildly dilated; mild TR   TUBAL LIGATION  1974   Social History   Socioeconomic History   Marital status: Divorced    Spouse name: Not on file   Number of children: 5   Years of education: Not on file   Highest education level: Not on file  Occupational History   Occupation: Scientist, 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   Tobacco Use   Smoking status: Never   Smokeless tobacco: Never  Vaping Use   Vaping Use: Never used  Substance and Sexual Activity   Alcohol use: No   Drug use: No   Sexual activity: Never  Other Topics Concern   Not on file  Social History Narrative   Not on file   Social Determinants of Health   Financial Resource Strain: Not on file  Food Insecurity: Not on file  Transportation Needs: Not on file  Physical Activity: Not on file  Stress: Not on file  Social Connections: Not on file  Intimate Partner Violence: Not on file   Current Outpatient Medications on File Prior to Visit  Medication Sig Dispense Refill   allopurinol (ZYLOPRIM) 300 MG tablet Take 300 mg by mouth daily.     Blood Glucose Monitoring Suppl (ONE TOUCH ULTRA 2) w/Device KIT Check blood sugar 1 time daily 1 kit 0   carvedilol (COREG) 3.125 MG tablet Take 3.125 mg by mouth 2 (two) times daily with a meal.     cyclobenzaprine (FLEXERIL) 10 MG tablet Take 1 tablet (10 mg total) by mouth 2 (two) times daily as needed for muscle spasms. 20 tablet 0   glucose blood (ONETOUCH VERIO) test strip Use as instructed to check blood sugar 1X daily 100 each 12   Lancets (ONETOUCH ULTRASOFT) lancets Use as instructed to check blood sugar 1X daily 100 each 12   levothyroxine (SYNTHROID) 100 MCG tablet Take 1 tablet (100 mcg total) by mouth daily.  (Patient not taking: Reported on 01/14/2023) 45 tablet 3   levothyroxine (SYNTHROID) 50 MCG tablet Take 50 mcg by mouth daily before breakfast.     losartan (COZAAR) 100 MG tablet TAKE 1/2 (ONE-HALF) TABLET BY MOUTH ONCE DAILY     meloxicam (MOBIC) 15 MG tablet TAKE 1 TABLET BY MOUTH ONCE DAILY AS NEEDED FOR PAIN FOR 90 DAYS     metFORMIN (GLUCOPHAGE-XR) 500 MG 24 hr tablet Take 500 mg by mouth daily with breakfast.     OneTouch Delica  Lancets 30G MISC Check blood sugar 1 time daly 100 each 12   pantoprazole (PROTONIX) 40 MG tablet Take 1 tablet by mouth once daily 90 tablet 3   triamterene-hydrochlorothiazide (DYAZIDE) 37.5-25 MG capsule Take 1 capsule by mouth daily.     No current facility-administered medications on file prior to visit.   No Known Allergies Family History  Problem Relation Age of Onset   Clotting disorder Mother        blood clot   Thyroid disease Sister    Lung cancer Sister    Heart Problems Brother    Kidney disease Brother    Other Brother        cerebral hemorrhage   PE: BP 118/70   Pulse 68   Ht 5\' 5"  (1.651 m)   Wt 203 lb (92.1 kg)   SpO2 99%   BMI 33.78 kg/m  Wt Readings from Last 3 Encounters:  01/15/23 203 lb (92.1 kg)  01/14/23 204 lb 6.4 oz (92.7 kg)  07/15/22 210 lb 6.4 oz (95.4 kg)   Constitutional: overweight, in NAD Eyes: no exophthalmos ENT: no thyromegaly, no cervical lymphadenopathy Cardiovascular: RRR, No MRG Respiratory: CTA B Musculoskeletal: no deformities Skin: no rashes Neurological: no tremor with outstretched hands Diabetic Foot Exam - Simple   Simple Foot Form Diabetic Foot exam was performed with the following findings: Yes 01/15/2023  1:28 PM  Visual Inspection No deformities, no ulcerations, no other skin breakdown bilaterally: Yes Sensation Testing Intact to touch and monofilament testing bilaterally: Yes Pulse Check Posterior Tibialis and Dorsalis pulse intact bilaterally: Yes Comments     ASSESSMENT: Prediabetes with complications - PVD - CKD  2.  Uncontrolled hypothyroidism  PLAN:  1. Patient with history of prediabetes, on oral antidiabetic regimen with low-dose metformin, 500 mg daily, with good control.  At last visit, HbA1c was lower, at 5.8%.  We did not change his regimen at that time.  She had stopped drinking sweet tea and I strongly advised her to stay off any sweet drinks. -At today's visit, sugars remain well-controlled.  She had 1 lower blood sugar, at 67, early afternoon, after skipping breakfast.  For now, we discussed about continuing the current regimen.  I refilled her metformin. - I suggested to:  Patient Instructions  Please continue: - Metformin ER 500 mg 1x a day  Check sugars 1x a day, rotating check times.  Continue Levothyroxine 100 mcg daily.  Take the thyroid hormone every day, with water, at least 30 minutes before breakfast, separated by at least 4 hours from: - acid reflux medications - calcium - iron - multivitamins  Please return for another visit in 6 months.  - we checked her HbA1c: 6.1% (slightly higher) - advised to check sugars at different times of the day - 1x a day, rotating check times - advised for yearly eye exams >> she is UTD - return to clinic in 6 months  2.  Uncontrolled hypothyroidism - latest thyroid labs reviewed with pt. >> normal: Lab Results  Component Value Date   TSH 2.96 01/14/2023  - she continues on LT4 100 mcg daily, dose decreased at last visit - pt feels good on this dose. - we discussed about taking the thyroid hormone every day, with water, >30 minutes before breakfast, separated by >4 hours from acid reflux medications, calcium, iron, multivitamins. Pt. is taking it correctly.  Carlus Pavlov, MD PhD [facility]

## 2023-01-15 NOTE — Patient Instructions (Signed)
Please continue: - Metformin ER 500 mg 1x a day  Check sugars 1x a day, rotating check times.  Continue Levothyroxine 100 mcg daily.  Take the thyroid hormone every day, with water, at least 30 minutes before breakfast, separated by at least 4 hours from: - acid reflux medications - calcium - iron - multivitamins  Please return for another visit in 6 months.

## 2023-01-16 LAB — CELIAC PANEL 10: Endomysial IgA: NEGATIVE

## 2023-01-17 LAB — CELIAC PANEL 10: IgA/Immunoglobulin A, Serum: 303 mg/dL (ref 64–422)

## 2023-01-18 LAB — CELIAC PANEL 10: Tissue Transglut Ab: 3 U/mL (ref 0–5)

## 2023-01-20 ENCOUNTER — Telehealth: Payer: Self-pay | Admitting: Gastroenterology

## 2023-01-20 NOTE — Telephone Encounter (Signed)
Questions answered.

## 2023-01-20 NOTE — Telephone Encounter (Signed)
Inbound call from patient retuning phone call regarding recent lab results. Please advise, thank you.

## 2023-01-26 ENCOUNTER — Ambulatory Visit (INDEPENDENT_AMBULATORY_CARE_PROVIDER_SITE_OTHER): Payer: Medicare Other

## 2023-01-26 ENCOUNTER — Ambulatory Visit (HOSPITAL_COMMUNITY)
Admission: RE | Admit: 2023-01-26 | Discharge: 2023-01-26 | Disposition: A | Discharge status: Home / Self Care | Payer: Medicare Other | Source: Ambulatory Visit | Attending: Gastroenterology | Admitting: Gastroenterology

## 2023-01-26 ENCOUNTER — Encounter (HOSPITAL_COMMUNITY): Payer: Self-pay

## 2023-01-26 DIAGNOSIS — K449 Diaphragmatic hernia without obstruction or gangrene: Secondary | ICD-10-CM | POA: Diagnosis not present

## 2023-01-26 DIAGNOSIS — K7689 Other specified diseases of liver: Secondary | ICD-10-CM | POA: Diagnosis not present

## 2023-01-26 DIAGNOSIS — D509 Iron deficiency anemia, unspecified: Secondary | ICD-10-CM

## 2023-01-26 DIAGNOSIS — K4020 Bilateral inguinal hernia, without obstruction or gangrene, not specified as recurrent: Secondary | ICD-10-CM | POA: Diagnosis not present

## 2023-01-26 DIAGNOSIS — R1032 Left lower quadrant pain: Secondary | ICD-10-CM | POA: Diagnosis not present

## 2023-01-26 DIAGNOSIS — N281 Cyst of kidney, acquired: Secondary | ICD-10-CM | POA: Diagnosis not present

## 2023-01-26 LAB — HEMOCCULT SLIDES (X 3 CARDS)
Fecal Occult Blood: NEGATIVE
OCCULT 1: NEGATIVE
OCCULT 2: NEGATIVE
OCCULT 3: NEGATIVE
OCCULT 4: NEGATIVE
OCCULT 5: NEGATIVE

## 2023-01-26 MED ORDER — IOHEXOL 300 MG/ML  SOLN
100.0000 mL | Freq: Once | INTRAMUSCULAR | Status: AC | PRN
  Administered 2023-01-26: 100 mL via INTRAVENOUS

## 2023-01-26 MED ORDER — SODIUM CHLORIDE (PF) 0.9 % IJ SOLN
INTRAMUSCULAR | Status: AC
  Filled 2023-01-26: qty 50

## 2023-01-28 NOTE — Progress Notes (Signed)
Please inform the patient. CT negative for any acute abnormalities. However, does show persistent endometrial thickening She should follow-up with OB/GYN for further workup Send report to family physician

## 2023-02-02 ENCOUNTER — Other Ambulatory Visit: Payer: Self-pay | Admitting: Internal Medicine

## 2023-02-02 DIAGNOSIS — E039 Hypothyroidism, unspecified: Secondary | ICD-10-CM

## 2023-02-05 DIAGNOSIS — R9389 Abnormal findings on diagnostic imaging of other specified body structures: Secondary | ICD-10-CM | POA: Diagnosis not present

## 2023-02-12 NOTE — Progress Notes (Signed)
Cardiology Office Note:  .   Date:  02/13/2023  ID:  Kathryn Diaz, DOB [birth year], MRN [id number] PCP: Garlan Fillers, MD  [facility] Providers Cardiologist:  None    History of Present Illness: .   Kathryn Diaz is a 72 y.o. female with a past medical history of HTN, HLD, hypothyroidism, Mobitz type 1, DM. Paitent is followed by Dr. Allyson Sabal and presents toay for an annual follow up appointment and pre operative evaluation prior to [facility]  Per chart review, patient previously underwent echocardiogram in 12/2011 that showed EF 60-65%, moderate LVH, no regional wall motion abnormalities, grade I DD, mild mitral valve regurgitation, mild tricuspid regurgitation. Nuclear stress test in 12/2011 showed no ischemia and was a normal, low risk study.   Patient was last seen by Dr Allyson Sabal in 11/2021. At that time, patient was doing well. BP was well controlled on carvedilol, losartan, dyazide.   Today, patient reports that she has been doing very well since last being seen. She denies chest pain, palpitations, shortness of breath. Denies syncope, near syncope, dizziness. No lightheadedness upon standing. Occasionally has mild ankle edema that improves if she elevates her feet. She feels like she is able to do everything that she wants to do. Can shop, go up and down stairs, and go for walks without chest pain or shortness of breath. Checks her BP at home, and her systolic BP is usually in the 409W-119J. She is looking forward to her D and C, and she is hopeful that it will help alleviate her pelvic pain/fullness.   ROS: Denies chest pain, dyspnea on exertion, palpitations, syncope, near syncope, shortness of breath. No chest pain on exertion.   Studies Reviewed: .      Risk Assessment/Calculations:             Physical Exam:   VS:  BP 100/70 (BP Location: Right Arm, Patient Position: Sitting, Cuff Size: Normal)   Pulse 65   Ht 5\' 5"  (1.651 m)   Wt 204 lb 12.8 oz (92.9 kg)   SpO2 96%    BMI 34.08 kg/m    Wt Readings from Last 3 Encounters:  02/13/23 204 lb 12.8 oz (92.9 kg)  01/15/23 203 lb (92.1 kg)  01/14/23 204 lb 6.4 oz (92.7 kg)    GEN: Well nourished, well developed in no acute distress. Sitting comfortably in the chair  NECK: No JVD; No carotid bruits CARDIAC: RRR, no murmurs, rubs, gallops. Radial pulses 2+ bilaterally  RESPIRATORY:  Clear to auscultation without rales, wheezing or rhonchi. Normal work of breathing on room air   ABDOMEN: Soft, non-tender, non-distended EXTREMITIES:  Trace edema in BLE; No deformity   ASSESSMENT AND PLAN: .    Preoperative evaluation  - Patient is currently scheduled for a dilatation and curettage on 9/11 - She denies chest pain, shortness of breath, palpitations, syncope, near syncope. Able to go up and down stairs, shop, and go for walks without chest pain or shortness of breath. EKG stable compared to previous  - No indication for further cardiac workup prior to surgery  - Patient is not on antiplatelets or anticoagulation   HTN  - BP well controlled on carvedilol 3.125 mg BID, losartan 50 mg daily, dyazide 37-25 mg daily  - Patient denies dizziness, syncope, near syncope. No dizziness upon standing  - CMP from 12/2022 showed K 3.6, creatinine 1.12  HLD  - Lipid panel from 02/2022 showed LDL 87, HDL 55, triglycerides 53 total cholesterol  153 - Patient been on rosuvastatin 20 mg daily- this is not on her medication list, but she reports good compliance. Added to her med list   Dispo: 12 months   Signed, Jonita Albee, PA-C

## 2023-02-13 ENCOUNTER — Other Ambulatory Visit: Payer: Self-pay

## 2023-02-13 ENCOUNTER — Ambulatory Visit: Payer: Federal, State, Local not specified - PPO | Attending: Cardiology | Admitting: Cardiology

## 2023-02-13 ENCOUNTER — Encounter: Payer: Self-pay | Admitting: Cardiology

## 2023-02-13 VITALS — BP 100/70 | HR 65 | Ht 65.0 in | Wt 204.8 lb

## 2023-02-13 DIAGNOSIS — E782 Mixed hyperlipidemia: Secondary | ICD-10-CM

## 2023-02-13 DIAGNOSIS — Z01818 Encounter for other preprocedural examination: Secondary | ICD-10-CM | POA: Diagnosis not present

## 2023-02-13 DIAGNOSIS — I10 Essential (primary) hypertension: Secondary | ICD-10-CM

## 2023-02-13 NOTE — Patient Instructions (Signed)
Medication Instructions:  Your physician recommends that you continue on your current medications as directed. Please refer to the Current Medication list given to you today.  *If you need a refill on your cardiac medications before your next appointment, please call your pharmacy*   Lab Work: None at this time If you have labs (blood work) drawn today and your tests are completely normal, you will receive your results only by: MyChart Message (if you have MyChart) OR A paper copy in the mail If you have any lab test that is abnormal or we need to change your treatment, we will call you to review the results.   Testing/Procedures: None at this time.   Follow-Up: At [facility], you and your health needs are our priority.  As part of our continuing mission to provide you with exceptional heart care, we have created designated Provider Care Teams.  These Care Teams include your primary Cardiologist (physician) and Advanced Practice Providers (APPs -  Physician Assistants and Nurse Practitioners) who all work together to provide you with the care you need, when you need it.  We recommend signing up for the patient portal called "MyChart".  Sign up information is provided on this After Visit Summary.  MyChart is used to connect with patients for Virtual Visits (Telemedicine).  Patients are able to view lab/test results, encounter notes, upcoming appointments, etc.  Non-urgent messages can be sent to your provider as well.   To learn more about what you can do with MyChart, go to [email or web address].    Your next appointment:   1 year(s)  Provider:   Dr. Allyson Sabal

## 2023-02-17 DIAGNOSIS — N819 Female genital prolapse, unspecified: Secondary | ICD-10-CM | POA: Diagnosis not present

## 2023-02-19 DIAGNOSIS — M25562 Pain in left knee: Secondary | ICD-10-CM | POA: Diagnosis not present

## 2023-02-19 DIAGNOSIS — M545 Low back pain, unspecified: Secondary | ICD-10-CM | POA: Diagnosis not present

## 2023-02-24 DIAGNOSIS — G4733 Obstructive sleep apnea (adult) (pediatric): Secondary | ICD-10-CM | POA: Diagnosis not present

## 2023-03-04 ENCOUNTER — Other Ambulatory Visit: Payer: Self-pay | Admitting: Obstetrics and Gynecology

## 2023-03-04 DIAGNOSIS — Z01818 Encounter for other preprocedural examination: Secondary | ICD-10-CM

## 2023-03-09 NOTE — Addendum Note (Signed)
Addended by: Pollie Meyer on: 03/09/2023 09:47 AM   Modules accepted: Orders

## 2023-03-25 DIAGNOSIS — R42 Dizziness and giddiness: Secondary | ICD-10-CM | POA: Diagnosis not present

## 2023-03-25 DIAGNOSIS — I10 Essential (primary) hypertension: Secondary | ICD-10-CM | POA: Diagnosis not present

## 2023-03-25 DIAGNOSIS — E119 Type 2 diabetes mellitus without complications: Secondary | ICD-10-CM | POA: Diagnosis not present

## 2023-03-27 ENCOUNTER — Encounter (HOSPITAL_BASED_OUTPATIENT_CLINIC_OR_DEPARTMENT_OTHER): Payer: Self-pay | Admitting: Obstetrics and Gynecology

## 2023-04-01 ENCOUNTER — Encounter (HOSPITAL_BASED_OUTPATIENT_CLINIC_OR_DEPARTMENT_OTHER): Payer: Self-pay | Admitting: Obstetrics and Gynecology

## 2023-04-01 NOTE — Progress Notes (Addendum)
Spoke w/ via phone for pre-op interview--- pt Lab needs dos---- cbc, istat              Lab results------ current EKG in epic/ chart COVID test -----patient states asymptomatic no test needed Arrive at ------- 0800 on 04-08-2023 NPO after MN NO Solid Food.  Clear liquids from MN until--- 0700 Med rec completed Medications to take morning of surgery ----- crestor, coreg, allopurinol, synthroid Diabetic medication ----- do not take metformin morning of surgery Patient instructed no nail polish to be worn day of surgery Patient instructed to bring photo id and insurance card day of surgery Patient aware to have Driver (ride ) / caregiver    for 24 hours after surgery --0 daughter, Kathryn Diaz Patient Special Instructions ----- n/a Pre-Op special Instructions ----- pt has cardiac clearance office note by Robet Leu PA on 02-13-2023 in epic/ chart. Patient verbalized understanding of instructions that were given at this phone interview. Patient denies shortness of breath, chest pain, fever, cough at this phone interview.

## 2023-04-05 NOTE — H&P (Addendum)
72 y.o. with thickened endometrium.   Previously: "Pt. here for pre op visit; D&C hysteroscopy scheduled 09/11; Pt. has gellhorn Pessary in place; Pt. saw Cards on 07/23 and got clearance per pt/tb//  Previously:"Thickened endo, may have been there since 2013 but uncertain and also now worsened. D/w pt need tissue diagnosis and d/w her EMB vs. D&C, hysteroscopy. D/w pt latter more likely to get better tissue and be more comfortable with anesthesia.  Pt may have had a "blockage" in artery but was able to do surgery in 2013. Pt to see Cards immediately for fitness for D&C, hysterscopy in office, will try for August. "  Past Medical History:  Diagnosis Date   Anemia    Arthritis    Chronic gout    04-01-2023  pt stated last episode > 20 yrs ago   CKD (chronic kidney disease), stage III (HCC)    Headache(784.0)    History of adenomatous polyp of colon    History of right breast cancer 1982   Hyperlipidemia, mixed    Hypertension    Hypothyroidism    endocrinologist-- dr Elvera Lennox;  hx benign nodule bx 1998   Mild obstructive sleep apnea    04-01-2023  per pt uses oral appliance nightly   Mobitz type 1 second degree atrioventricular block 12/2011   cardiologist--- dr Allyson Sabal;  dx 06/ 2013  w/ bradycardia;  nuclear stress test 01-23-2012  normal perfusion no ischemia, nuclear ef 66%;  echo 01-16-2012 ef 60-65%, G1DD, mild MR/ TR   Pre-diabetes    endocrinologist--- dr Elvera Lennox;   (04-01-2023  per pt check blood sugar 2 times wkly)   Thickened endometrium    Past Surgical History:  Procedure Laterality Date   BREAST RECONSTRUCTION Right 1983   COLONOSCOPY  12/2017   dr Lottie Mussel   LAPAROSCOPY  11/11/2011   Procedure: LAPAROSCOPY OPERATIVE;  Surgeon: Miguel Aschoff, MD;  Location: [facility];  Service: Gynecology;  Laterality: N/A;   MASTECTOMY Right 1982   RIGHT BREAST    REPAIR TENDONS FOOT Right 1999   right ankle   RIGHT ANKLE REPAIR Right 1989   SALPINGOOPHORECTOMY  11/11/2011   Procedure:  SALPINGO OOPHERECTOMY;  Surgeon: Miguel Aschoff, MD;  Location: [facility];  Service: Gynecology;  Laterality: Bilateral;   SINUS ENDO WITH FUSION Left 01/07/2021   Procedure: SINUS ENDOSCOPY WITH FUSION NAVIGATION;  Surgeon: Newman Pies, MD;  Location: [facility];  Service: ENT;  Laterality: Left;   SPHENOIDECTOMY Left 01/07/2021   Procedure: LEFT ENDOSCOPIC  SPHENOIDECTOMY WITH TISSUE REMOVAL;  Surgeon: Newman Pies, MD;  Location: [facility];  Service: ENT;  Laterality: Left;   TUBAL LIGATION  1974    Social History   Socioeconomic History   Marital status: Divorced    Spouse name: Not on file   Number of children: 5   Years of education: Not on file   Highest education level: Not on file  Occupational History   Occupation: Scientist, 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   Tobacco Use   Smoking status: Never   Smokeless tobacco: Never  Vaping Use   Vaping status: Never Used  Substance and Sexual Activity   Alcohol use: No   Drug use: Never   Sexual activity: Not on file  Other Topics Concern   Not on file  Social History Narrative   Not on file   Social Determinants of Health   Financial Resource Strain: Not on file  Food Insecurity:  Not on file  Transportation Needs: Not on file  Physical Activity: Not on file  Stress: Not on file  Social Connections: Not on file  Intimate Partner Violence: Not on file    No current facility-administered medications on file prior to encounter.   Current Outpatient Medications on File Prior to Encounter  Medication Sig Dispense Refill   acetaminophen (TYLENOL) 650 MG CR tablet Take 650 mg by mouth every 8 (eight) hours as needed for pain.     allopurinol (ZYLOPRIM) 300 MG tablet Take 300 mg by mouth daily.     Berberine Chloride (BERBERINE HCI) 500 MG CAPS Take 1 capsule by mouth daily after lunch.     carvedilol (COREG) 3.125 MG tablet Take 3.125 mg by mouth 2 (two) times daily with a meal.      levothyroxine (SYNTHROID) 100 MCG tablet Take 1 tablet by mouth once daily (Patient taking differently: Take 100 mcg by mouth daily.) 90 tablet 0   losartan (COZAAR) 50 MG tablet Take 50 mg by mouth daily.     meloxicam (MOBIC) 15 MG tablet Take 15 mg by mouth daily as needed for pain.     metFORMIN (GLUCOPHAGE-XR) 500 MG 24 hr tablet Take 1 tablet (500 mg total) by mouth daily with breakfast. (Patient taking differently: Take 500 mg by mouth daily after breakfast.) 90 tablet 3   Multiple Vitamin (MULTIVITAMIN) capsule Take 1 capsule by mouth daily.     triamterene-hydrochlorothiazide (DYAZIDE) 37.5-25 MG capsule Take 1 capsule by mouth daily.     Blood Glucose Monitoring Suppl (ONE TOUCH ULTRA 2) w/Device KIT Check blood sugar 1 time daily 1 kit 0   glucose blood (ONETOUCH VERIO) test strip Use as instructed to check blood sugar 1X daily 100 each 12   Lancets (ONETOUCH ULTRASOFT) lancets Use as instructed to check blood sugar 1X daily 100 each 12   OneTouch Delica Lancets 30G MISC Check blood sugar 1 time daly 100 each 12    No Known Allergies  Vitals:   04/01/23 0944  Weight: 92.1 kg  Height: 5\' 5"  (1.651 m)    Lungs: clear to ascultation Cor:  RRR Abdomen:  soft, nontender, nondistended. Ex:  no cords, erythema Pelvic:   Vulva: no masses, no atrophy, no lesions Vagina: no tenderness, no erythema, no abnormal vaginal discharge, no vesicle(s) or ulcers, cystocele (to 0), rectocele (-1) Cervix: grossly normal, no discharge, no cervical motion tenderness Uterus: normal size, normal shape, midline, mobile, non-tender, uterine prolapse (some) Bladder/Urethra: no urethral discharge, no urethral mass, bladder non distended, Urethra hypermobile (>45) Adnexa/Parametria: no parametrial tenderness, no parametrial mass, no adnexal tenderness, no ovarian mass  A:  For D&C,hysteroscopy.  Pt has significant prolapse and does not like the gel horn pessary.  She does desire definitive surgery but  will need to get tissue dx before proceeding.     P: P: All risks, benefits and alternatives d/w patient and she desires to proceed.  Patient has undergone an ERAS protocol and SCDs during the operation.  Loney Laurence

## 2023-04-07 NOTE — Anesthesia Preprocedure Evaluation (Signed)
Anesthesia Evaluation  Patient identified by MRN, date of birth, ID band Patient awake    Reviewed: Allergy & Precautions, NPO status , Patient's Chart, lab work & pertinent test results, reviewed documented beta blocker date and time   History of Anesthesia Complications Negative for: history of anesthetic complications  Airway Mallampati: I  TM Distance: >3 FB Neck ROM: Full    Dental no notable dental hx.    Pulmonary sleep apnea    Pulmonary exam normal        Cardiovascular hypertension, Pt. on medications and Pt. on home beta blockers Normal cardiovascular exam+ dysrhythmias (Mobitz type 1)      Neuro/Psych  Headaches    GI/Hepatic negative GI ROS, Neg liver ROS,,,  Endo/Other  diabetesHypothyroidism    Renal/GU Renal InsufficiencyRenal disease  negative genitourinary   Musculoskeletal  (+) Arthritis ,    Abdominal   Peds  Hematology negative hematology ROS (+)   Anesthesia Other Findings Day of surgery medications reviewed with patient.  Reproductive/Obstetrics thickened endometrium                              Anesthesia Physical Anesthesia Plan  ASA: 2  Anesthesia Plan: General   Post-op Pain Management: Tylenol PO (pre-op)*   Induction: Intravenous  PONV Risk Score and Plan: 3 and Treatment may vary due to age or medical condition, Dexamethasone and Ondansetron  Airway Management Planned: LMA  Additional Equipment: None  Intra-op Plan:   Post-operative Plan: Extubation in OR  Informed Consent: I have reviewed the patients History and Physical, chart, labs and discussed the procedure including the risks, benefits and alternatives for the proposed anesthesia with the patient or authorized representative who has indicated his/her understanding and acceptance.     Dental advisory given  Plan Discussed with: CRNA  Anesthesia Plan Comments:          Anesthesia Quick Evaluation

## 2023-04-08 ENCOUNTER — Ambulatory Visit (HOSPITAL_BASED_OUTPATIENT_CLINIC_OR_DEPARTMENT_OTHER): Payer: Self-pay | Admitting: Anesthesiology

## 2023-04-08 ENCOUNTER — Other Ambulatory Visit: Payer: Self-pay

## 2023-04-08 ENCOUNTER — Ambulatory Visit (HOSPITAL_BASED_OUTPATIENT_CLINIC_OR_DEPARTMENT_OTHER)
Admission: RE | Admit: 2023-04-08 | Discharge: 2023-04-08 | Disposition: A | Discharge status: Home / Self Care | Payer: Medicare Other | Attending: Obstetrics and Gynecology | Admitting: Obstetrics and Gynecology

## 2023-04-08 ENCOUNTER — Encounter (HOSPITAL_BASED_OUTPATIENT_CLINIC_OR_DEPARTMENT_OTHER): Payer: Self-pay | Admitting: Obstetrics and Gynecology

## 2023-04-08 ENCOUNTER — Ambulatory Visit (HOSPITAL_BASED_OUTPATIENT_CLINIC_OR_DEPARTMENT_OTHER): Payer: Medicare Other | Admitting: Anesthesiology

## 2023-04-08 ENCOUNTER — Encounter (HOSPITAL_BASED_OUTPATIENT_CLINIC_OR_DEPARTMENT_OTHER)
Admission: RE | Disposition: A | Discharge status: Home / Self Care | Payer: Self-pay | Source: Home / Self Care | Attending: Obstetrics and Gynecology

## 2023-04-08 DIAGNOSIS — G4730 Sleep apnea, unspecified: Secondary | ICD-10-CM | POA: Diagnosis not present

## 2023-04-08 DIAGNOSIS — R519 Headache, unspecified: Secondary | ICD-10-CM | POA: Diagnosis not present

## 2023-04-08 DIAGNOSIS — N183 Chronic kidney disease, stage 3 unspecified: Secondary | ICD-10-CM | POA: Diagnosis not present

## 2023-04-08 DIAGNOSIS — E782 Mixed hyperlipidemia: Secondary | ICD-10-CM | POA: Insufficient documentation

## 2023-04-08 DIAGNOSIS — E039 Hypothyroidism, unspecified: Secondary | ICD-10-CM | POA: Diagnosis not present

## 2023-04-08 DIAGNOSIS — R9389 Abnormal findings on diagnostic imaging of other specified body structures: Secondary | ICD-10-CM | POA: Insufficient documentation

## 2023-04-08 DIAGNOSIS — R7303 Prediabetes: Secondary | ICD-10-CM | POA: Insufficient documentation

## 2023-04-08 DIAGNOSIS — M1990 Unspecified osteoarthritis, unspecified site: Secondary | ICD-10-CM | POA: Insufficient documentation

## 2023-04-08 DIAGNOSIS — Z7984 Long term (current) use of oral hypoglycemic drugs: Secondary | ICD-10-CM | POA: Diagnosis not present

## 2023-04-08 DIAGNOSIS — N840 Polyp of corpus uteri: Secondary | ICD-10-CM

## 2023-04-08 DIAGNOSIS — I441 Atrioventricular block, second degree: Secondary | ICD-10-CM | POA: Diagnosis not present

## 2023-04-08 DIAGNOSIS — I129 Hypertensive chronic kidney disease with stage 1 through stage 4 chronic kidney disease, or unspecified chronic kidney disease: Secondary | ICD-10-CM | POA: Diagnosis not present

## 2023-04-08 DIAGNOSIS — N858 Other specified noninflammatory disorders of uterus: Secondary | ICD-10-CM | POA: Diagnosis not present

## 2023-04-08 DIAGNOSIS — I10 Essential (primary) hypertension: Secondary | ICD-10-CM | POA: Insufficient documentation

## 2023-04-08 DIAGNOSIS — Z01818 Encounter for other preprocedural examination: Secondary | ICD-10-CM

## 2023-04-08 HISTORY — DX: Unspecified osteoarthritis, unspecified site: M19.90

## 2023-04-08 HISTORY — DX: Personal history of adenomatous and serrated colon polyps: Z86.0101

## 2023-04-08 HISTORY — DX: Anemia, unspecified: D64.9

## 2023-04-08 HISTORY — DX: Abnormal findings on diagnostic imaging of other specified body structures: R93.89

## 2023-04-08 HISTORY — DX: Chronic gout, unspecified, without tophus (tophi): M1A.9XX0

## 2023-04-08 HISTORY — DX: Obstructive sleep apnea (adult) (pediatric): G47.33

## 2023-04-08 HISTORY — DX: Prediabetes: R73.03

## 2023-04-08 HISTORY — DX: Chronic kidney disease, stage 3 unspecified: N18.30

## 2023-04-08 HISTORY — DX: Mixed hyperlipidemia: E78.2

## 2023-04-08 HISTORY — PX: HYSTEROSCOPY WITH D & C: SHX1775

## 2023-04-08 HISTORY — DX: Personal history of colonic polyps: Z86.010

## 2023-04-08 LAB — CBC
HCT: 35.6 % — ABNORMAL LOW (ref 36.0–46.0)
Hemoglobin: 11.4 g/dL — ABNORMAL LOW (ref 12.0–15.0)
MCH: 28.1 pg (ref 26.0–34.0)
MCHC: 32 g/dL (ref 30.0–36.0)
MCV: 87.9 fL (ref 80.0–100.0)
Platelets: 204 10*3/uL (ref 150–400)
RBC: 4.05 MIL/uL (ref 3.87–5.11)
RDW: 15.4 % (ref 11.5–15.5)
WBC: 6.3 10*3/uL (ref 4.0–10.5)
nRBC: 0 % (ref 0.0–0.2)

## 2023-04-08 LAB — POCT I-STAT, CHEM 8
BUN: 22 mg/dL (ref 8–23)
Calcium, Ion: 1.34 mmol/L (ref 1.15–1.40)
Chloride: 104 mmol/L (ref 98–111)
Creatinine, Ser: 1.1 mg/dL — ABNORMAL HIGH (ref 0.44–1.00)
Glucose, Bld: 93 mg/dL (ref 70–99)
HCT: 34 % — ABNORMAL LOW (ref 36.0–46.0)
Hemoglobin: 11.6 g/dL — ABNORMAL LOW (ref 12.0–15.0)
Potassium: 3.5 mmol/L (ref 3.5–5.1)
Sodium: 142 mmol/L (ref 135–145)
TCO2: 24 mmol/L (ref 22–32)

## 2023-04-08 SURGERY — DILATATION AND CURETTAGE /HYSTEROSCOPY
Anesthesia: General | Site: Uterus

## 2023-04-08 MED ORDER — PROPOFOL 10 MG/ML IV BOLUS
INTRAVENOUS | Status: DC | PRN
Start: 2023-04-08 — End: 2023-04-08
  Administered 2023-04-08: 200 mg via INTRAVENOUS

## 2023-04-08 MED ORDER — SODIUM CHLORIDE 0.9 % IR SOLN
Status: DC | PRN
  Administered 2023-04-08: 3000 mL

## 2023-04-08 MED ORDER — ACETAMINOPHEN 500 MG PO TABS
1000.0000 mg | ORAL_TABLET | Freq: Once | ORAL | Status: AC
  Administered 2023-04-08: 1000 mg via ORAL

## 2023-04-08 MED ORDER — POVIDONE-IODINE 10 % EX SWAB: 2.0000 | Freq: Once | CUTANEOUS | Status: DC

## 2023-04-08 MED ORDER — ONDANSETRON HCL 4 MG/2ML IJ SOLN
INTRAMUSCULAR | Status: AC
  Filled 2023-04-08: qty 2

## 2023-04-08 MED ORDER — FENTANYL CITRATE (PF) 100 MCG/2ML IJ SOLN
INTRAMUSCULAR | Status: DC | PRN
  Administered 2023-04-08: 50 ug via INTRAVENOUS

## 2023-04-08 MED ORDER — ACETAMINOPHEN 500 MG PO TABS
ORAL_TABLET | ORAL | Status: AC
  Filled 2023-04-08: qty 2

## 2023-04-08 MED ORDER — WHITE PETROLATUM EX OINT
TOPICAL_OINTMENT | CUTANEOUS | Status: AC
  Filled 2023-04-08: qty 5

## 2023-04-08 MED ORDER — DEXAMETHASONE SODIUM PHOSPHATE 10 MG/ML IJ SOLN
INTRAMUSCULAR | Status: AC
  Filled 2023-04-08: qty 1

## 2023-04-08 MED ORDER — FENTANYL CITRATE (PF) 100 MCG/2ML IJ SOLN
INTRAMUSCULAR | Status: AC
  Filled 2023-04-08: qty 2

## 2023-04-08 MED ORDER — OXYCODONE HCL 5 MG/5ML PO SOLN: 5.0000 mg | Freq: Once | ORAL | Status: DC | PRN

## 2023-04-08 MED ORDER — DROPERIDOL 2.5 MG/ML IJ SOLN: 0.6250 mg | Freq: Once | INTRAMUSCULAR | Status: DC | PRN

## 2023-04-08 MED ORDER — DEXAMETHASONE SODIUM PHOSPHATE 10 MG/ML IJ SOLN
INTRAMUSCULAR | Status: DC | PRN
  Administered 2023-04-08: 10 mg via INTRAVENOUS

## 2023-04-08 MED ORDER — ONDANSETRON HCL 4 MG/2ML IJ SOLN
INTRAMUSCULAR | Status: DC | PRN
  Administered 2023-04-08: 4 mg via INTRAVENOUS

## 2023-04-08 MED ORDER — PROPOFOL 10 MG/ML IV BOLUS
INTRAVENOUS | Status: AC
  Filled 2023-04-08: qty 20

## 2023-04-08 MED ORDER — OXYCODONE HCL 5 MG PO TABS: 5.0000 mg | ORAL_TABLET | Freq: Once | ORAL | Status: DC | PRN

## 2023-04-08 MED ORDER — SODIUM CHLORIDE 0.9 % IV SOLN: INTRAVENOUS | Status: DC

## 2023-04-08 MED ORDER — LIDOCAINE HCL (PF) 2 % IJ SOLN
INTRAMUSCULAR | Status: AC
  Filled 2023-04-08: qty 5

## 2023-04-08 MED ORDER — SOD CITRATE-CITRIC ACID 500-334 MG/5ML PO SOLN: 30.0000 mL | ORAL | Status: DC

## 2023-04-08 MED ORDER — EPHEDRINE SULFATE (PRESSORS) 50 MG/ML IJ SOLN
INTRAMUSCULAR | Status: DC | PRN
  Administered 2023-04-08: 5 mg via INTRAVENOUS
  Administered 2023-04-08 (×2): 10 mg via INTRAVENOUS

## 2023-04-08 MED ORDER — LIDOCAINE HCL (CARDIAC) PF 100 MG/5ML IV SOSY
PREFILLED_SYRINGE | INTRAVENOUS | Status: DC | PRN
  Administered 2023-04-08: 100 mg via INTRAVENOUS

## 2023-04-08 MED ORDER — LACTATED RINGERS IV SOLN: INTRAVENOUS | Status: DC | PRN

## 2023-04-08 MED ORDER — FENTANYL CITRATE (PF) 100 MCG/2ML IJ SOLN: 25.0000 ug | INTRAMUSCULAR | Status: DC | PRN

## 2023-04-08 MED ORDER — LACTATED RINGERS IV SOLN: INTRAVENOUS | Status: DC

## 2023-04-08 SURGICAL SUPPLY — 18 items
ABLATOR SURESOUND NOVASURE (ABLATOR) IMPLANT
CATH ROBINSON RED A/P 16FR (CATHETERS) ×1 IMPLANT
DILATOR CANAL MILEX (MISCELLANEOUS) IMPLANT
DRSG TELFA 3X8 NADH STRL (GAUZE/BANDAGES/DRESSINGS) ×1 IMPLANT
ELECT REM PT RETURN 9FT ADLT (ELECTROSURGICAL)
ELECTRODE REM PT RTRN 9FT ADLT (ELECTROSURGICAL) IMPLANT
GAUZE 4X4 16PLY ~~LOC~~+RFID DBL (SPONGE) ×1 IMPLANT
GLOVE BIO SURGEON STRL SZ7 (GLOVE) ×1 IMPLANT
GOWN STRL REUS W/TWL XL LVL3 (GOWN DISPOSABLE) ×1 IMPLANT
KIT PROCEDURE FLUENT (KITS) ×1 IMPLANT
KIT TURNOVER CYSTO (KITS) ×1 IMPLANT
LOOP CUTTING BIPOLAR 21FR (ELECTRODE) IMPLANT
PACK VAGINAL MINOR WOMEN LF (CUSTOM PROCEDURE TRAY) ×1 IMPLANT
PAD OB MATERNITY 4.3X12.25 (PERSONAL CARE ITEMS) ×1 IMPLANT
SEAL ROD LENS SCOPE MYOSURE (ABLATOR) ×2 IMPLANT
SLEEVE SCD COMPRESS KNEE MED (STOCKING) ×1 IMPLANT
TOWEL OR 17X24 6PK STRL BLUE (TOWEL DISPOSABLE) ×1 IMPLANT
WATER STERILE IRR 500ML POUR (IV SOLUTION) ×1 IMPLANT

## 2023-04-08 NOTE — Anesthesia Procedure Notes (Signed)
Procedure Name: LMA Insertion Date/Time: 04/08/2023 10:19 AM  Performed by: Karen Kitchens, CRNAPre-anesthesia Checklist: Patient identified, Emergency Drugs available, Suction available and Patient being monitored Patient Re-evaluated:Patient Re-evaluated prior to induction Oxygen Delivery Method: Circle system utilized Preoxygenation: Pre-oxygenation with 100% oxygen Induction Type: IV induction Ventilation: Mask ventilation without difficulty LMA: LMA inserted LMA Size: 3.0 Number of attempts: 1 Airway Equipment and Method: Bite block Placement Confirmation: positive ETCO2, CO2 detector and breath sounds checked- equal and bilateral Tube secured with: Tape Dental Injury: Teeth and Oropharynx as per pre-operative assessment

## 2023-04-08 NOTE — Anesthesia Postprocedure Evaluation (Signed)
Anesthesia Post Note  Patient: Anmol Decook Weissmann  Procedure(s) Performed: DILATATION AND CURETTAGE /HYSTEROSCOPY (Uterus)     Patient location during evaluation: PACU Anesthesia Type: General Level of consciousness: awake and alert Pain management: pain level controlled Vital Signs Assessment: post-procedure vital signs reviewed and stable Respiratory status: spontaneous breathing, nonlabored ventilation and respiratory function stable Cardiovascular status: blood pressure returned to baseline Postop Assessment: no apparent nausea or vomiting Anesthetic complications: no   No notable events documented.  Last Vitals:  Vitals:   04/08/23 1100 04/08/23 1115  BP: 109/65 110/66  Pulse: (!) 55 (!) 53  Resp: 13 10  Temp:  36.4 C  SpO2: 100% 97%    Last Pain:  Vitals:   04/08/23 1115  TempSrc:   PainSc: 0-No pain                 Shanda Howells

## 2023-04-08 NOTE — Transfer of Care (Signed)
Immediate Anesthesia Transfer of Care Note  Patient: Kathryn Diaz  Procedure(s) Performed: DILATATION AND CURETTAGE /HYSTEROSCOPY (Uterus)  Patient Location: PACU  Anesthesia Type:General  Level of Consciousness: awake, alert , and patient cooperative  Airway & Oxygen Therapy: Patient Spontanous Breathing and Patient connected to face mask oxygen  Post-op Assessment: Report given to RN and Post -op Vital signs reviewed and stable  Post vital signs: Reviewed and stable  Last Vitals:  Vitals Value Taken Time  BP 107/63 04/08/23 1046  Temp    Pulse 65 04/08/23 1049  Resp 14 04/08/23 1049  SpO2 100 % 04/08/23 1049  Vitals shown include unfiled device data.  Last Pain:  Vitals:   04/08/23 0817  TempSrc: Oral  PainSc: 0-No pain      Patients Stated Pain Goal: 7 (04/08/23 0817)  Complications: No notable events documented.

## 2023-04-08 NOTE — Discharge Instructions (Addendum)
No acetaminophen/Tylenol until after 2:20pm today if needed for pain.   Post Anesthesia Home Care Instructions  Activity: Get plenty of rest for the remainder of the day. A responsible individual must stay with you for 24 hours following the procedure.  For the next 24 hours, DO NOT: -Drive a car -Advertising copywriter -Drink alcoholic beverages -Take any medication unless instructed by your physician -Make any legal decisions or sign important papers.  Meals: Start with liquid foods such as gelatin or soup. Progress to regular foods as tolerated. Avoid greasy, spicy, heavy foods. If nausea and/or vomiting occur, drink only clear liquids until the nausea and/or vomiting subsides. Call your physician if vomiting continues.  Special Instructions/Symptoms: Your throat may feel dry or sore from the anesthesia or the breathing tube placed in your throat during surgery. If this causes discomfort, gargle with warm salt water. The discomfort should disappear within 24 hours.

## 2023-04-08 NOTE — Op Note (Signed)
04/08/2023  11:02 AM  PATIENT:  Kathryn Diaz  72 y.o. female  PRE-OPERATIVE DIAGNOSIS:  thickened endometrium  POST-OPERATIVE DIAGNOSIS:  thickened endometrium  PROCEDURE:  Procedure(s): DILATATION AND CURETTAGE /HYSTEROSCOPY (N/A)  SURGEON:  Surgeons and Role:    * Carrington Clamp, MD - Primary  ANESTHESIA:   general  EBL:  25 mL   LOCAL MEDICATIONS USED:  NONE  SPECIMEN:  Source of Specimen:  uterine polyp and currettings  DISPOSITION OF SPECIMEN:  PATHOLOGY  COUNTS:  YES  TOURNIQUET:  * No tourniquets in log *  DICTATION: .Note written in EPIC  PLAN OF CARE: Discharge to home after PACU  PATIENT DISPOSITION:  PACU - hemodynamically stable.   Delay start of Pharmacological VTE agent (>24hrs) due to surgical blood loss or risk of bleeding: not applicable.  Complications: none  Medications: none  Findings: 3 cm polyp intracavitary removed completely with polyp forceps.  Otherwise normal cavity.   Technique:  After adequate anesthesia was achieved, the patient was prepped and draped in the usual sterile fashion.  The speculum was placed in the vagina and the cervix stabilized with a single-tooth tenaculum.  The cervix was dilated with pratt dilators and the hysteroscope passed inside the endometrial cavity.  The above findings were noted and polyp forceps were used to remove a large polyp intact.  Sharp curettage was then performed and uterine curettings sent to path.  All instruments were removed from the vagina.  The patient tolerated the procedure well.    Beniah Magnan A

## 2023-04-08 NOTE — Progress Notes (Signed)
There has been no change in the patients history, status or exam since the history and physical.  Vitals:   04/01/23 0944 04/08/23 0817  BP:  119/73  Pulse:  62  Resp:  18  Temp:  97.8 F (36.6 C)  TempSrc:  Oral  SpO2:  99%  Weight: 92.1 kg 92.5 kg  Height: 5\' 5"  (1.651 m) 5\' 5"  (1.651 m)    No results found for this or any previous visit (from the past 72 hour(s)).  Loney Laurence

## 2023-04-08 NOTE — Brief Op Note (Signed)
04/08/2023  11:02 AM  PATIENT:  Kathryn Diaz  71 y.o. female  PRE-OPERATIVE DIAGNOSIS:  thickened endometrium  POST-OPERATIVE DIAGNOSIS:  thickened endometrium  PROCEDURE:  Procedure(s): DILATATION AND CURETTAGE /HYSTEROSCOPY (N/A)  SURGEON:  Surgeons and Role:    * Carrington Clamp, MD - Primary  ANESTHESIA:   general  EBL:  25 mL   LOCAL MEDICATIONS USED:  NONE  SPECIMEN:  Source of Specimen:  uterine polyp and currettings  DISPOSITION OF SPECIMEN:  PATHOLOGY  COUNTS:  YES  TOURNIQUET:  * No tourniquets in log *  DICTATION: .Note written in EPIC  PLAN OF CARE: Discharge to home after PACU  PATIENT DISPOSITION:  PACU - hemodynamically stable.   Delay start of Pharmacological VTE agent (>24hrs) due to surgical blood loss or risk of bleeding: not applicable

## 2023-04-09 ENCOUNTER — Encounter (HOSPITAL_BASED_OUTPATIENT_CLINIC_OR_DEPARTMENT_OTHER): Payer: Self-pay | Admitting: Obstetrics and Gynecology

## 2023-04-09 LAB — SURGICAL PATHOLOGY

## 2023-04-16 DIAGNOSIS — J343 Hypertrophy of nasal turbinates: Secondary | ICD-10-CM | POA: Diagnosis not present

## 2023-04-16 DIAGNOSIS — H6123 Impacted cerumen, bilateral: Secondary | ICD-10-CM | POA: Diagnosis not present

## 2023-04-16 DIAGNOSIS — J323 Chronic sphenoidal sinusitis: Secondary | ICD-10-CM | POA: Diagnosis not present

## 2023-04-27 ENCOUNTER — Ambulatory Visit: Payer: Federal, State, Local not specified - PPO | Admitting: Gastroenterology

## 2023-04-27 ENCOUNTER — Other Ambulatory Visit: Payer: Self-pay | Admitting: Internal Medicine

## 2023-04-27 DIAGNOSIS — E039 Hypothyroidism, unspecified: Secondary | ICD-10-CM

## 2023-05-11 DIAGNOSIS — H25813 Combined forms of age-related cataract, bilateral: Secondary | ICD-10-CM | POA: Diagnosis not present

## 2023-05-11 DIAGNOSIS — H524 Presbyopia: Secondary | ICD-10-CM | POA: Diagnosis not present

## 2023-05-11 DIAGNOSIS — R7309 Other abnormal glucose: Secondary | ICD-10-CM | POA: Diagnosis not present

## 2023-05-11 DIAGNOSIS — R519 Headache, unspecified: Secondary | ICD-10-CM | POA: Diagnosis not present

## 2023-05-11 DIAGNOSIS — H04123 Dry eye syndrome of bilateral lacrimal glands: Secondary | ICD-10-CM | POA: Diagnosis not present

## 2023-05-22 DIAGNOSIS — Z1231 Encounter for screening mammogram for malignant neoplasm of breast: Secondary | ICD-10-CM | POA: Diagnosis not present

## 2023-05-23 DIAGNOSIS — X501XXA Overexertion from prolonged static or awkward postures, initial encounter: Secondary | ICD-10-CM | POA: Diagnosis not present

## 2023-05-23 DIAGNOSIS — M542 Cervicalgia: Secondary | ICD-10-CM | POA: Diagnosis not present

## 2023-05-23 DIAGNOSIS — S161XXA Strain of muscle, fascia and tendon at neck level, initial encounter: Secondary | ICD-10-CM | POA: Diagnosis not present

## 2023-06-03 DIAGNOSIS — M542 Cervicalgia: Secondary | ICD-10-CM | POA: Diagnosis not present

## 2023-06-08 DIAGNOSIS — S161XXD Strain of muscle, fascia and tendon at neck level, subsequent encounter: Secondary | ICD-10-CM | POA: Diagnosis not present

## 2023-06-08 DIAGNOSIS — M6281 Muscle weakness (generalized): Secondary | ICD-10-CM | POA: Diagnosis not present

## 2023-06-08 DIAGNOSIS — M25611 Stiffness of right shoulder, not elsewhere classified: Secondary | ICD-10-CM | POA: Diagnosis not present

## 2023-06-09 ENCOUNTER — Encounter: Payer: Self-pay | Admitting: Gastroenterology

## 2023-06-09 ENCOUNTER — Ambulatory Visit (INDEPENDENT_AMBULATORY_CARE_PROVIDER_SITE_OTHER): Payer: Medicare Other | Admitting: Gastroenterology

## 2023-06-09 ENCOUNTER — Other Ambulatory Visit (INDEPENDENT_AMBULATORY_CARE_PROVIDER_SITE_OTHER): Payer: Medicare Other

## 2023-06-09 VITALS — BP 110/70 | HR 61 | Ht 65.0 in | Wt 211.1 lb

## 2023-06-09 DIAGNOSIS — Z860100 Personal history of colon polyps, unspecified: Secondary | ICD-10-CM

## 2023-06-09 DIAGNOSIS — D509 Iron deficiency anemia, unspecified: Secondary | ICD-10-CM

## 2023-06-09 LAB — IBC + FERRITIN
Ferritin: 40 ng/mL (ref 10.0–291.0)
Iron: 57 ug/dL (ref 42–145)
Saturation Ratios: 19.8 % — ABNORMAL LOW (ref 20.0–50.0)
TIBC: 288.4 ug/dL (ref 250.0–450.0)
Transferrin: 206 mg/dL — ABNORMAL LOW (ref 212.0–360.0)

## 2023-06-09 LAB — COMPREHENSIVE METABOLIC PANEL
ALT: 13 U/L (ref 0–35)
AST: 17 U/L (ref 0–37)
Albumin: 4.2 g/dL (ref 3.5–5.2)
Alkaline Phosphatase: 56 U/L (ref 39–117)
BUN: 22 mg/dL (ref 6–23)
CO2: 25 meq/L (ref 19–32)
Calcium: 10.4 mg/dL (ref 8.4–10.5)
Chloride: 106 meq/L (ref 96–112)
Creatinine, Ser: 1.15 mg/dL (ref 0.40–1.20)
GFR: 47.32 mL/min — ABNORMAL LOW (ref >60.00)
Glucose, Bld: 97 mg/dL (ref 70–99)
Potassium: 3.7 meq/L (ref 3.5–5.1)
Sodium: 142 meq/L (ref 135–145)
Total Bilirubin: 0.5 mg/dL (ref 0.2–1.2)
Total Protein: 6.7 g/dL (ref 6.0–8.3)

## 2023-06-09 LAB — FOLATE: Folate: >24.2 ng/mL (ref >5.9)

## 2023-06-09 LAB — VITAMIN B12: Vitamin B-12: 432 pg/mL (ref 211–911)

## 2023-06-09 NOTE — Patient Instructions (Signed)
_______________________________________________________  If your blood pressure at your visit was 140/90 or greater, please contact your primary care physician to follow up on this.  _______________________________________________________  If you are age 73 or older, your body mass index should be between 23-30. Your Body mass index is 35.13 kg/m. If this is out of the aforementioned range listed, please consider follow up with your Primary Care Provider.  If you are age 43 or younger, your body mass index should be between 19-25. Your Body mass index is 35.13 kg/m. If this is out of the aformentioned range listed, please consider follow up with your Primary Care Provider.   ________________________________________________________  The [facility] providers would like to encourage you to use [facility] to communicate with providers for non-urgent requests or questions.  Due to long hold times on the telephone, sending your provider a message by [facility] may be a faster and more efficient way to get a response.  Please allow 48 business hours for a response.  Please remember that this is for non-urgent requests.  _______________________________________________________  Your provider has requested that you go to the basement level for lab work before leaving today. Press "B" on the elevator. The lab is located at the first door on the left as you exit the elevator.  Please follow up in 6 months. Give [place] a call at [phone] to schedule an appointment.  Thank you,  Dr. Lynann Bologna

## 2023-06-09 NOTE — Progress Notes (Signed)
Chief Complaint: FU anemia  Referring Provider:  Garlan Fillers, MD      ASSESSMENT AND PLAN;   #1. IDA- neg EGD, colon 01/2020 as below. Has CKD2, neg hemoccult x 3, neg CT AP 01/2023  #3. H/O polyps. Next colon due 01/2025   Plan: -CBC, CMP, iron studies, B12/folate. -If still with anemia, then proceed with colon earlier. -FU in 24 weeks.   HPI:    Kathryn Diaz is a 72 y.o. female  Very pleasant With multiple medical problems as listed below including history of breast cancer, DM2, HTN, HLD, hypothyroidism, ?OSA, CKD 2  Seen in GI clinic d/t anemia.  Heme-negative stools, negative CT Abdo/pelvis 01/2023 except for endometrial wall thickening  S/P D&C- benign pathology.   Overall doing well except for mild fatigue.  Most recent hemoglobin    Latest Ref Rng & Units 04/08/2023    8:46 AM 04/08/2023    8:43 AM 01/14/2023   11:56 AM  CBC  WBC 4.0 - 10.5 K/uL 6.3   6.6   Hemoglobin 12.0 - 15.0 g/dL 01.0  27.2  53.6   Hematocrit 36.0 - 46.0 % 35.6  34.0  38.2   Platelets 150 - 400 K/uL 204   226.0      Patient denies having any significant GI complaints except for intermittent LLQ abdominal pain-not related to defecation.  This has been occurring over the last several years.  She had extensive GI evaluation previously by Dr. Kinnie Scales as detailed below.  No nausea, vomiting, heartburn, regurgitation, odynophagia or dysphagia.  No significant diarrhea or constipation.  No melena or hematochezia. No unintentional weight loss.    Stop taking Protonix several years ago.  No heartburn.  She denies having any nosebleeds, easy bruisability, hematuria or any vaginal bleeding.  She is s/p hysterectomy.  No history of craving of ice.  No nonsteroidals.  Wt Readings from Last 3 Encounters:  06/09/23 211 lb 2 oz (95.8 kg)  04/08/23 203 lb 14.4 oz (92.5 kg)  02/13/23 204 lb 12.8 oz (92.9 kg)     Past GI workup: (Dr. Kinnie Scales):  EGD 01/31/2020: Normal EGD, GE junction at 40  cm  Colonoscopy 2018 -5 colonic polyps s/p polypectomy -Ascending colon lipoma. -Otherwise normal colonoscopy to TI. -Repeat in 3 years  Colonoscopy 01/31/2020 -No polyps. -Mid ascending colon large lipoma. -Otherwise normal colonoscopy to TI. -Recommended repeat in 5 years  CT AP with contrast 01/2023 1. No acute intra-abdominal or intrapelvic abnormality. 2. Thickened endometrium up to 13 mm. However, endometrium is thick on multiple prior imaging. Correlate clinically to determine the need for further evaluation with pelvic ultrasound. 3. Multiple other nonacute observations, as described above.  CT Abdo/pelvis 08/2017: neg  Hemoccult cards Jan 2024-ve  SH-divorced, 2 girls, retired  Past Medical History:  Diagnosis Date   Anemia    Arthritis    Chronic gout    04-01-2023  pt stated last episode > 20 yrs ago   CKD (chronic kidney disease), stage III (HCC)    Headache(784.0)    History of adenomatous polyp of colon    History of right breast cancer 1982   Hyperlipidemia, mixed    Hypertension    Hypothyroidism    endocrinologist-- dr Elvera Lennox;  hx benign nodule bx 1998   Mild obstructive sleep apnea    04-01-2023  per pt uses oral appliance nightly   Mobitz type 1 second degree atrioventricular block 12/2011   cardiologist--- dr Allyson Sabal;  dx 06/  2013  w/ bradycardia;  nuclear stress test 01-23-2012  normal perfusion no ischemia, nuclear ef 66%;  echo 01-16-2012 ef 60-65%, G1DD, mild MR/ TR   Pre-diabetes    endocrinologist--- dr Elvera Lennox;   (04-01-2023  per pt check blood sugar 2 times wkly)   Thickened endometrium     Past Surgical History:  Procedure Laterality Date   BREAST RECONSTRUCTION Right 1983   COLONOSCOPY  12/2017   dr Lottie Mussel   HYSTEROSCOPY WITH D & C N/A 04/08/2023   Procedure: DILATATION AND CURETTAGE /HYSTEROSCOPY;  Surgeon: Carrington Clamp, MD;  Location: [facility];  Service: Gynecology;  Laterality: N/A;   LAPAROSCOPY  11/11/2011    Procedure: LAPAROSCOPY OPERATIVE;  Surgeon: Miguel Aschoff, MD;  Location: [facility];  Service: Gynecology;  Laterality: N/A;   MASTECTOMY Right 1982   RIGHT BREAST    REPAIR TENDONS FOOT Right 1999   right ankle   RIGHT ANKLE REPAIR Right 1989   SALPINGOOPHORECTOMY  11/11/2011   Procedure: SALPINGO OOPHERECTOMY;  Surgeon: Miguel Aschoff, MD;  Location: [facility];  Service: Gynecology;  Laterality: Bilateral;   SINUS ENDO WITH FUSION Left 01/07/2021   Procedure: SINUS ENDOSCOPY WITH FUSION NAVIGATION;  Surgeon: Newman Pies, MD;  Location: [facility];  Service: ENT;  Laterality: Left;   SPHENOIDECTOMY Left 01/07/2021   Procedure: LEFT ENDOSCOPIC  SPHENOIDECTOMY WITH TISSUE REMOVAL;  Surgeon: Newman Pies, MD;  Location: [facility];  Service: ENT;  Laterality: Left;   TUBAL LIGATION  1974    Family History  Problem Relation Age of Onset   Clotting disorder Mother        blood clot   Thyroid disease Sister    Lung cancer Sister    Heart Problems Brother    Kidney disease Brother    Other Brother        cerebral hemorrhage    Social History   Tobacco Use   Smoking status: Never   Smokeless tobacco: Never  Vaping Use   Vaping status: Never Used  Substance Use Topics   Alcohol use: No   Drug use: Never    Current Outpatient Medications  Medication Sig Dispense Refill   acetaminophen (TYLENOL) 650 MG CR tablet Take 650 mg by mouth every 8 (eight) hours as needed for pain.     allopurinol (ZYLOPRIM) 300 MG tablet Take 300 mg by mouth daily.     Berberine Chloride (BERBERINE HCI) 500 MG CAPS Take 1 capsule by mouth daily after lunch.     Blood Glucose Monitoring Suppl (ONE TOUCH ULTRA 2) w/Device KIT Check blood sugar 1 time daily 1 kit 0   carvedilol (COREG) 3.125 MG tablet Take 3.125 mg by mouth 2 (two) times daily with a meal.     glucose blood (ONETOUCH VERIO) test strip Use as instructed to check blood sugar 1X daily 100 each 12   Lancets (ONETOUCH ULTRASOFT)  lancets Use as instructed to check blood sugar 1X daily 100 each 12   levothyroxine (SYNTHROID) 100 MCG tablet Take 1 tablet (100 mcg total) by mouth daily. 90 tablet 0   losartan (COZAAR) 50 MG tablet Take 50 mg by mouth daily.     meloxicam (MOBIC) 15 MG tablet Take 15 mg by mouth daily as needed for pain.     metFORMIN (GLUCOPHAGE-XR) 500 MG 24 hr tablet Take 1 tablet (500 mg total) by mouth daily with breakfast. (Patient taking differently: Take 500 mg by mouth daily after breakfast.) 90 tablet 3  Multiple Vitamin (MULTIVITAMIN) capsule Take 1 capsule by mouth daily.     OneTouch Delica Lancets 30G MISC Check blood sugar 1 time daly 100 each 12   rosuvastatin (CRESTOR) 20 MG tablet Take 20 mg by mouth daily.     triamterene-hydrochlorothiazide (DYAZIDE) 37.5-25 MG capsule Take 1 capsule by mouth daily.     No current facility-administered medications for this visit.    No Known Allergies  Review of Systems:  neg     Physical Exam:    BP 110/70   Pulse 61   Ht 5\' 5"  (1.651 m)   Wt 211 lb 2 oz (95.8 kg)   BMI 35.13 kg/m  Wt Readings from Last 3 Encounters:  06/09/23 211 lb 2 oz (95.8 kg)  04/08/23 203 lb 14.4 oz (92.5 kg)  02/13/23 204 lb 12.8 oz (92.9 kg)   Constitutional:  Well-developed, in no acute distress. Psychiatric: Normal mood and affect. Behavior is normal. HEENT: Pupils normal.  Conjunctivae are normal. No scleral icterus. Cardiovascular: Normal rate, regular rhythm. No edema Pulmonary/chest: Effort normal and breath sounds normal. No wheezing, rales or rhonchi. Abdominal: Soft, nondistended. Nontender. Bowel sounds active throughout. There are no masses palpable. No hepatomegaly. Neurological: Alert and oriented to person place and time. Skin: Skin is warm and dry. No rashes noted.  Data Reviewed: I have personally reviewed following labs and imaging studies  CBC:    Latest Ref Rng & Units 04/08/2023    8:46 AM 04/08/2023    8:43 AM 01/14/2023   11:56 AM   CBC  WBC 4.0 - 10.5 K/uL 6.3   6.6   Hemoglobin 12.0 - 15.0 g/dL 54.0  98.1  19.1   Hematocrit 36.0 - 46.0 % 35.6  34.0  38.2   Platelets 150 - 400 K/uL 204   226.0     CMP:    Latest Ref Rng & Units 04/08/2023    8:43 AM 01/14/2023   11:56 AM 05/13/2022   12:51 AM  CMP  Glucose 70 - 99 mg/dL 93  478  295   BUN 8 - 23 mg/dL 22  22  20    Creatinine 0.44 - 1.00 mg/dL 6.21  3.08  6.57   Sodium 135 - 145 mmol/L 142  143  140   Potassium 3.5 - 5.1 mmol/L 3.5  3.6  3.3   Chloride 98 - 111 mmol/L 104  106  108   CO2 19 - 32 mEq/L  26  24   Calcium 8.4 - 10.5 mg/dL  84.6  9.6   Total Protein 6.0 - 8.3 g/dL  6.9  7.0   Total Bilirubin 0.2 - 1.2 mg/dL  0.5  0.5   Alkaline Phos 39 - 117 U/L  48  56   AST 0 - 37 U/L  16  17   ALT 0 - 35 U/L  12  14        Edman Circle, MD 06/09/2023, 2:43 PM  Cc: Garlan Fillers, MD

## 2023-06-10 LAB — CBC WITH DIFFERENTIAL/PLATELET
Basophils Absolute: 0 10*3/uL (ref 0.0–0.1)
Basophils Relative: 0.9 % (ref 0.0–3.0)
Eosinophils Absolute: 0 10*3/uL (ref 0.0–0.7)
Eosinophils Relative: 0.7 % (ref 0.0–5.0)
HCT: 36.2 % (ref 36.0–46.0)
Hemoglobin: 12.1 g/dL (ref 12.0–15.0)
Lymphocytes Relative: 34.5 % (ref 12.0–46.0)
Lymphs Abs: 2 10*3/uL (ref 0.7–4.0)
MCHC: 33.3 g/dL (ref 30.0–36.0)
MCV: 85.4 fL (ref 78.0–100.0)
Monocytes Absolute: 0.4 10*3/uL (ref 0.1–1.0)
Monocytes Relative: 6.6 % (ref 3.0–12.0)
Neutro Abs: 3.2 10*3/uL (ref 1.4–7.7)
Neutrophils Relative %: 57.3 % (ref 43.0–77.0)
Platelets: 219 10*3/uL (ref 150.0–400.0)
RBC: 4.24 Mil/uL (ref 3.87–5.11)
RDW: 15.7 % — ABNORMAL HIGH (ref 11.5–15.5)
WBC: 5.7 10*3/uL (ref 4.0–10.5)

## 2023-06-11 DIAGNOSIS — M6281 Muscle weakness (generalized): Secondary | ICD-10-CM | POA: Diagnosis not present

## 2023-06-11 DIAGNOSIS — M25611 Stiffness of right shoulder, not elsewhere classified: Secondary | ICD-10-CM | POA: Diagnosis not present

## 2023-06-11 DIAGNOSIS — S161XXD Strain of muscle, fascia and tendon at neck level, subsequent encounter: Secondary | ICD-10-CM | POA: Diagnosis not present

## 2023-06-16 DIAGNOSIS — M25611 Stiffness of right shoulder, not elsewhere classified: Secondary | ICD-10-CM | POA: Diagnosis not present

## 2023-06-16 DIAGNOSIS — M6281 Muscle weakness (generalized): Secondary | ICD-10-CM | POA: Diagnosis not present

## 2023-06-16 DIAGNOSIS — S161XXD Strain of muscle, fascia and tendon at neck level, subsequent encounter: Secondary | ICD-10-CM | POA: Diagnosis not present

## 2023-06-18 DIAGNOSIS — S161XXD Strain of muscle, fascia and tendon at neck level, subsequent encounter: Secondary | ICD-10-CM | POA: Diagnosis not present

## 2023-06-18 DIAGNOSIS — M6281 Muscle weakness (generalized): Secondary | ICD-10-CM | POA: Diagnosis not present

## 2023-06-18 DIAGNOSIS — M25611 Stiffness of right shoulder, not elsewhere classified: Secondary | ICD-10-CM | POA: Diagnosis not present

## 2023-06-29 DIAGNOSIS — M6281 Muscle weakness (generalized): Secondary | ICD-10-CM | POA: Diagnosis not present

## 2023-06-29 DIAGNOSIS — M542 Cervicalgia: Secondary | ICD-10-CM | POA: Diagnosis not present

## 2023-06-29 DIAGNOSIS — M25611 Stiffness of right shoulder, not elsewhere classified: Secondary | ICD-10-CM | POA: Diagnosis not present

## 2023-06-29 DIAGNOSIS — S161XXD Strain of muscle, fascia and tendon at neck level, subsequent encounter: Secondary | ICD-10-CM | POA: Diagnosis not present

## 2023-07-02 DIAGNOSIS — M25611 Stiffness of right shoulder, not elsewhere classified: Secondary | ICD-10-CM | POA: Diagnosis not present

## 2023-07-02 DIAGNOSIS — M6281 Muscle weakness (generalized): Secondary | ICD-10-CM | POA: Diagnosis not present

## 2023-07-02 DIAGNOSIS — S161XXD Strain of muscle, fascia and tendon at neck level, subsequent encounter: Secondary | ICD-10-CM | POA: Diagnosis not present

## 2023-07-07 DIAGNOSIS — M25611 Stiffness of right shoulder, not elsewhere classified: Secondary | ICD-10-CM | POA: Diagnosis not present

## 2023-07-07 DIAGNOSIS — M6281 Muscle weakness (generalized): Secondary | ICD-10-CM | POA: Diagnosis not present

## 2023-07-07 DIAGNOSIS — S161XXD Strain of muscle, fascia and tendon at neck level, subsequent encounter: Secondary | ICD-10-CM | POA: Diagnosis not present

## 2023-07-09 DIAGNOSIS — M6281 Muscle weakness (generalized): Secondary | ICD-10-CM | POA: Diagnosis not present

## 2023-07-09 DIAGNOSIS — N814 Uterovaginal prolapse, unspecified: Secondary | ICD-10-CM | POA: Insufficient documentation

## 2023-07-09 DIAGNOSIS — S161XXD Strain of muscle, fascia and tendon at neck level, subsequent encounter: Secondary | ICD-10-CM | POA: Diagnosis not present

## 2023-07-09 DIAGNOSIS — R300 Dysuria: Secondary | ICD-10-CM | POA: Diagnosis not present

## 2023-07-09 DIAGNOSIS — M25611 Stiffness of right shoulder, not elsewhere classified: Secondary | ICD-10-CM | POA: Diagnosis not present

## 2023-07-09 DIAGNOSIS — R102 Pelvic and perineal pain: Secondary | ICD-10-CM | POA: Diagnosis not present

## 2023-07-14 DIAGNOSIS — S161XXD Strain of muscle, fascia and tendon at neck level, subsequent encounter: Secondary | ICD-10-CM | POA: Diagnosis not present

## 2023-07-14 DIAGNOSIS — M6281 Muscle weakness (generalized): Secondary | ICD-10-CM | POA: Diagnosis not present

## 2023-07-14 DIAGNOSIS — M25611 Stiffness of right shoulder, not elsewhere classified: Secondary | ICD-10-CM | POA: Diagnosis not present

## 2023-07-15 LAB — BASIC METABOLIC PANEL: Glucose: 115

## 2023-07-15 NOTE — Congregational Nurse Program (Signed)
  Dept: [phone]   Congregational Nurse Program Note  Date of Encounter: 07/15/2023  Past Medical History: Past Medical History:  Diagnosis Date   Anemia    Arthritis    Chronic gout    04-01-2023  pt stated last episode > 20 yrs ago   CKD (chronic kidney disease), stage III (HCC)    Headache(784.0)    History of adenomatous polyp of colon    History of right breast cancer 1982   Hyperlipidemia, mixed    Hypertension    Hypothyroidism    endocrinologist-- dr Elvera Lennox;  hx benign nodule bx 1998   Mild obstructive sleep apnea    04-01-2023  per pt uses oral appliance nightly   Mobitz type 1 second degree atrioventricular block 12/2011   cardiologist--- dr Allyson Sabal;  dx 06/ 2013  w/ bradycardia;  nuclear stress test 01-23-2012  normal perfusion no ischemia, nuclear ef 66%;  echo 01-16-2012 ef 60-65%, G1DD, mild MR/ TR   Pre-diabetes    endocrinologist--- dr Elvera Lennox;   (04-01-2023  per pt check blood sugar 2 times wkly)   Thickened endometrium     Encounter Details:  Community Questionnaire - 07/15/23 1300       Questionnaire   Ask client: Do you give verbal consent for me to treat you today? Yes    Student Assistance N/A    Location Patient Served  [facility]    Encounter Setting CN site    Population Status Unknown    Insurance Medicare    Insurance/Financial Assistance Referral N/A    Medication N/A    Medical Provider Yes    Screening Referrals Made N/A    Medical Referrals Made N/A    Medical Appointment Completed N/A    CNP Interventions Educate;Spiritual Care;Advocate/Support    Screenings CN Performed Blood Glucose    ED Visit Averted N/A    Life-Saving Intervention Made N/A             Patient presented to the clinic today requesting a blood glucose check. Patient indicated that the glucose measurement would be a non-fasting random glucose. Discussed the importance of monitoring blood glucose levels and the implications of non fasting  readings. Patient expressed understanding of the process. Provided education on verbal conflict resolution techniques and offered spiritual care giving support.   Denny Peon, BSN, RN, CRRN,CMSRN

## 2023-07-16 DIAGNOSIS — M25611 Stiffness of right shoulder, not elsewhere classified: Secondary | ICD-10-CM | POA: Diagnosis not present

## 2023-07-16 DIAGNOSIS — M6281 Muscle weakness (generalized): Secondary | ICD-10-CM | POA: Diagnosis not present

## 2023-07-16 DIAGNOSIS — S161XXD Strain of muscle, fascia and tendon at neck level, subsequent encounter: Secondary | ICD-10-CM | POA: Diagnosis not present

## 2023-07-17 ENCOUNTER — Encounter: Payer: Self-pay | Admitting: Internal Medicine

## 2023-07-17 ENCOUNTER — Ambulatory Visit (INDEPENDENT_AMBULATORY_CARE_PROVIDER_SITE_OTHER): Payer: Medicare Other | Admitting: Internal Medicine

## 2023-07-17 VITALS — BP 110/60 | HR 89 | Ht 65.0 in | Wt 209.0 lb

## 2023-07-17 DIAGNOSIS — E039 Hypothyroidism, unspecified: Secondary | ICD-10-CM | POA: Diagnosis not present

## 2023-07-17 DIAGNOSIS — E119 Type 2 diabetes mellitus without complications: Secondary | ICD-10-CM | POA: Diagnosis not present

## 2023-07-17 DIAGNOSIS — Z7984 Long term (current) use of oral hypoglycemic drugs: Secondary | ICD-10-CM

## 2023-07-17 NOTE — Progress Notes (Signed)
Patient ID: Kathryn Diaz, female   DOB: 08/15/1950, 72 y.o.   MRN: [id number]  HPI: Kathryn Diaz is a 72 y.o.-year-old female, returning for follow-up for diabetes type 2, dx in 1995, non-insulin-dependent, controlled, with complications (peripheral vascular disease, CKD) and also uncontrolled hypothyroidism. Pt. previously saw Dr. Everardo All, but last visit with me 6 months ago.  Interim history: No increased urination, blurry vision, nausea, chest pain. In 2023, she started a healthy eating class at her [place]. She improved her diet by reducing carbs and stopped sweet tea.  She continues with this eating plan with only occasional dietary indiscretions. Since last visit, she had to have a D&C in 03/2023.  Results were benign. She had a pessary placed prev. but will have to have a hysterectomy next year. She has a UTI >> on ABx.  Reviewed history: Reviewing her chart, she appears to have diabetes as an entry but patient tells me that she has a history of prediabetes.  DM2: Reviewed HbA1c: Lab Results  Component Value Date   HGBA1C 6.1 (A) 01/15/2023   HGBA1C 5.8 (A) 07/15/2022   HGBA1C 6.2 (A) 02/28/2022   HGBA1C 6.6 (H) 11/14/2021   Pt is on a regimen of: - Metformin ER 500 mg 1x a day, with meals - forgetting some doses  Pt is checking sugars once a day: - am: n/c >> 90-110 >> 99-111 >> 96 - 2h after b'fast: n/c >> 103 - before lunch: n/c>> 67 >> 100, 110 - 2h after lunch: n/c >> 124>> n/c >> 87, 98 - before dinner: n/c - 2h after dinner: n/c >> 99-125 >> 90-100 - bedtime: n/c - nighttime: n/c  Glucometer: none >> One Touch Verio  - + CKD, last BUN/creatinine: Lab Results  Component Value Date   BUN 22 06/09/2023   BUN 22 04/08/2023   CREATININE 1.15 06/09/2023   CREATININE 1.10 (H) 04/08/2023   Lab Results  Component Value Date   MICRALBCREAT 1.9 02/28/2022  She is on losartan 50 mg daily.  -+ hyperlipidemia; last set of lipids: 09/09/2022: 150/80/49/85 Lab  Results  Component Value Date   CHOL 153 02/28/2022   HDL 55.10 02/28/2022   LDLCALC 87 02/28/2022   TRIG 53.0 02/28/2022   CHOLHDL 3 02/28/2022  Per review of Dr. Hazle Coca note: 08/01/2021: total cholesterol 166, LDL 107 and HDL 42 She was on simvastatin 80 mg daily >> changed to Rosuvastatin 20 mg daily.  - last eye exam was in 04/2023: No DR reportedly.  - no numbness and tingling in her feet. Sees [facility], but last foot exam was 01/15/2023 here in clinic.  Uncontrolled hypothyroidism:  Pt is on levothyroxine 100 mcg daily (dose increased 06/2022), taken: - in am - fasting - 1h from b'fast - no calcium - no iron - no multivitamins - stopped PPIs 12/2021 (was taking it in am) - not on Biotin  Reviewed her TFTs: Lab Results  Component Value Date   TSH 2.96 01/14/2023   TSH 0.50 09/26/2022   TSH 0.03 (L) 07/15/2022   TSH 1.00 02/28/2022   TSH 26.13 (H) 11/14/2021   TSH 27.60 (H) 09/12/2021   TSH 0.300 (L) 01/16/2012   She has a history of benign thyroid nodule biopsy in 1998.  She does have a history of OSA-on CPAP, HTN, breast cancer-1982, HAs.  ROS: + see HPI  Past Medical History:  Diagnosis Date   Anemia    Arthritis    Chronic gout    04-01-2023  pt stated last episode > 20 yrs ago   CKD (chronic kidney disease), stage III (HCC)    Headache(784.0)    History of adenomatous polyp of colon    History of right breast cancer 1982   Hyperlipidemia, mixed    Hypertension    Hypothyroidism    endocrinologist-- dr Elvera Lennox;  hx benign nodule bx 1998   Mild obstructive sleep apnea    04-01-2023  per pt uses oral appliance nightly   Mobitz type 1 second degree atrioventricular block 12/2011   cardiologist--- dr Allyson Sabal;  dx 06/ 2013  w/ bradycardia;  nuclear stress test 01-23-2012  normal perfusion no ischemia, nuclear ef 66%;  echo 01-16-2012 ef 60-65%, G1DD, mild MR/ TR   Pre-diabetes    endocrinologist--- dr Elvera Lennox;   (04-01-2023  per pt check blood  sugar 2 times wkly)   Thickened endometrium    Past Surgical History:  Procedure Laterality Date   BREAST RECONSTRUCTION Right 1983   COLONOSCOPY  12/2017   dr Lottie Mussel   HYSTEROSCOPY WITH D & C N/A 04/08/2023   Procedure: DILATATION AND CURETTAGE /HYSTEROSCOPY;  Surgeon: Carrington Clamp, MD;  Location: [facility];  Service: Gynecology;  Laterality: N/A;   LAPAROSCOPY  11/11/2011   Procedure: LAPAROSCOPY OPERATIVE;  Surgeon: Miguel Aschoff, MD;  Location: [facility];  Service: Gynecology;  Laterality: N/A;   MASTECTOMY Right 1982   RIGHT BREAST    REPAIR TENDONS FOOT Right 1999   right ankle   RIGHT ANKLE REPAIR Right 1989   SALPINGOOPHORECTOMY  11/11/2011   Procedure: SALPINGO OOPHERECTOMY;  Surgeon: Miguel Aschoff, MD;  Location: [facility];  Service: Gynecology;  Laterality: Bilateral;   SINUS ENDO WITH FUSION Left 01/07/2021   Procedure: SINUS ENDOSCOPY WITH FUSION NAVIGATION;  Surgeon: Newman Pies, MD;  Location: [facility];  Service: ENT;  Laterality: Left;   SPHENOIDECTOMY Left 01/07/2021   Procedure: LEFT ENDOSCOPIC  SPHENOIDECTOMY WITH TISSUE REMOVAL;  Surgeon: Newman Pies, MD;  Location: [facility];  Service: ENT;  Laterality: Left;   TUBAL LIGATION  1974   Social History   Socioeconomic History   Marital status: Divorced    Spouse name: Not on file   Number of children: 2   Years of education: Not on file   Highest education level: Not on file  Occupational History   Occupation: Scientist, 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     Comment: retired  Tobacco Use   Smoking status: Never   Smokeless tobacco: Never  Vaping Use   Vaping status: Never Used  Substance and Sexual Activity   Alcohol use: No   Drug use: Never   Sexual activity: Not on file  Other Topics Concern   Not on file  Social History Narrative   Not on file   Social Drivers of Health   Financial Resource Strain: Not on file  Food Insecurity: Not on file   Transportation Needs: Not on file  Physical Activity: Not on file  Stress: Not on file  Social Connections: Not on file  Intimate Partner Violence: Not on file   Current Outpatient Medications on File Prior to Visit  Medication Sig Dispense Refill   acetaminophen (TYLENOL) 650 MG CR tablet Take 650 mg by mouth every 8 (eight) hours as needed for pain.     allopurinol (ZYLOPRIM) 300 MG tablet Take 300 mg by mouth daily.     Berberine Chloride (BERBERINE HCI) 500 MG CAPS Take  1 capsule by mouth daily after lunch.     Blood Glucose Monitoring Suppl (ONE TOUCH ULTRA 2) w/Device KIT Check blood sugar 1 time daily 1 kit 0   carvedilol (COREG) 3.125 MG tablet Take 3.125 mg by mouth 2 (two) times daily with a meal.     glucose blood (ONETOUCH VERIO) test strip Use as instructed to check blood sugar 1X daily 100 each 12   Lancets (ONETOUCH ULTRASOFT) lancets Use as instructed to check blood sugar 1X daily 100 each 12   levothyroxine (SYNTHROID) 100 MCG tablet Take 1 tablet (100 mcg total) by mouth daily. 90 tablet 0   losartan (COZAAR) 50 MG tablet Take 50 mg by mouth daily.     meloxicam (MOBIC) 15 MG tablet Take 15 mg by mouth daily as needed for pain.     metFORMIN (GLUCOPHAGE-XR) 500 MG 24 hr tablet Take 1 tablet (500 mg total) by mouth daily with breakfast. (Patient taking differently: Take 500 mg by mouth daily after breakfast.) 90 tablet 3   Multiple Vitamin (MULTIVITAMIN) capsule Take 1 capsule by mouth daily.     OneTouch Delica Lancets 30G MISC Check blood sugar 1 time daly 100 each 12   rosuvastatin (CRESTOR) 20 MG tablet Take 20 mg by mouth daily.     triamterene-hydrochlorothiazide (DYAZIDE) 37.5-25 MG capsule Take 1 capsule by mouth daily.     No current facility-administered medications on file prior to visit.   No Known Allergies Family History  Problem Relation Age of Onset   Clotting disorder Mother        blood clot   Thyroid disease Sister    Lung cancer Sister    Heart  Problems Brother    Kidney disease Brother    Other Brother        cerebral hemorrhage   PE: BP 110/60   Pulse 89   Ht 5\' 5"  (1.651 m)   Wt 209 lb (94.8 kg)   SpO2 98%   BMI 34.78 kg/m  Wt Readings from Last 3 Encounters:  07/17/23 209 lb (94.8 kg)  06/09/23 211 lb 2 oz (95.8 kg)  04/08/23 203 lb 14.4 oz (92.5 kg)   Constitutional: overweight, in NAD Eyes: no exophthalmos ENT: no thyromegaly, no cervical lymphadenopathy Cardiovascular: RRR, No MRG Respiratory: CTA B Musculoskeletal: no deformities Skin: no rashes Neurological: no tremor with outstretched hands  ASSESSMENT: Prediabetes with complications - PVD - CKD  2.  Uncontrolled hypothyroidism  PLAN:  1. Patient with history of prediabetes, on oral antidiabetic regimen with low-dose metformin, 500 mg daily, with good control.  At last visit, HbA1c was slightly higher, at 6.1%, but still at goal.  Sugars remain well-controlled.  She had 1 lower blood sugar, at 67, early afternoon, after skipping breakfast.  We did not change her regimen at that time.  I refilled her metformin. -At today's visit, sugars are excellent, although she does mention that she forgets metformin.  We discussed that this can be taken whenever she remembers during the day and she will try to do so.  No other changes are needed for now. - I suggested to:  Patient Instructions  Please continue: - Metformin ER 500 mg 1x a day  Continue Levothyroxine 100 mcg daily.  Take the thyroid hormone every day, with water, at least 30 minutes before breakfast, separated by at least 4 hours from: - acid reflux medications - calcium - iron - multivitamins  Please stop at the lab.  Please return for another  visit in 6 months.  - we checked her HbA1c: 6.2% (excellent, only slightly increased) - advised to check sugars at different times of the day - 1x a day, rotating check times - advised for yearly eye exams >> she is UTD - will check an ACR today -  return to clinic in 6 months  2.  Uncontrolled hypothyroidism - latest thyroid labs reviewed with pt. >> normal: Lab Results  Component Value Date   TSH 2.96 01/14/2023  - she continues on LT4 100 mcg daily - pt feels good on this dose. - we discussed about taking the thyroid hormone every day, with water, >30 minutes before breakfast, separated by >4 hours from acid reflux medications, calcium, iron, multivitamins. Pt. is taking it correctly. - will check thyroid tests at next OV  Carlus Pavlov, MD PhD [facility]

## 2023-07-17 NOTE — Patient Instructions (Signed)
Please continue: - Metformin ER 500 mg 1x a day  Continue Levothyroxine 100 mcg daily.  Take the thyroid hormone every day, with water, at least 30 minutes before breakfast, separated by at least 4 hours from: - acid reflux medications - calcium - iron - multivitamins  Please stop at the lab.  Please return for another visit in 6 months.

## 2023-07-18 LAB — MICROALBUMIN / CREATININE URINE RATIO
Creatinine, Urine: 26 mg/dL (ref 20–275)
Microalb, Ur: <0.2 mg/dL

## 2023-07-21 ENCOUNTER — Other Ambulatory Visit: Payer: Self-pay | Admitting: Internal Medicine

## 2023-07-21 DIAGNOSIS — E039 Hypothyroidism, unspecified: Secondary | ICD-10-CM

## 2023-07-23 NOTE — Telephone Encounter (Signed)
Levothyroxine refill request complete

## 2023-07-24 ENCOUNTER — Other Ambulatory Visit: Payer: Self-pay | Admitting: Internal Medicine

## 2023-07-24 DIAGNOSIS — E039 Hypothyroidism, unspecified: Secondary | ICD-10-CM

## 2023-07-24 NOTE — Telephone Encounter (Signed)
RX filled by MD 07/23/23

## 2023-07-28 DIAGNOSIS — M25611 Stiffness of right shoulder, not elsewhere classified: Secondary | ICD-10-CM | POA: Diagnosis not present

## 2023-07-28 DIAGNOSIS — M25562 Pain in left knee: Secondary | ICD-10-CM | POA: Diagnosis not present

## 2023-07-28 DIAGNOSIS — S161XXD Strain of muscle, fascia and tendon at neck level, subsequent encounter: Secondary | ICD-10-CM | POA: Diagnosis not present

## 2023-07-28 DIAGNOSIS — M6281 Muscle weakness (generalized): Secondary | ICD-10-CM | POA: Diagnosis not present

## 2023-08-03 DIAGNOSIS — S161XXD Strain of muscle, fascia and tendon at neck level, subsequent encounter: Secondary | ICD-10-CM | POA: Diagnosis not present

## 2023-08-03 DIAGNOSIS — M542 Cervicalgia: Secondary | ICD-10-CM | POA: Diagnosis not present

## 2023-08-03 DIAGNOSIS — M6281 Muscle weakness (generalized): Secondary | ICD-10-CM | POA: Diagnosis not present

## 2023-08-03 DIAGNOSIS — M25611 Stiffness of right shoulder, not elsewhere classified: Secondary | ICD-10-CM | POA: Diagnosis not present

## 2023-08-04 DIAGNOSIS — M25562 Pain in left knee: Secondary | ICD-10-CM | POA: Diagnosis not present

## 2023-08-18 ENCOUNTER — Other Ambulatory Visit: Payer: Self-pay | Admitting: Obstetrics and Gynecology

## 2023-08-18 DIAGNOSIS — Z01818 Encounter for other preprocedural examination: Secondary | ICD-10-CM

## 2023-08-18 DIAGNOSIS — M25562 Pain in left knee: Secondary | ICD-10-CM | POA: Diagnosis not present

## 2023-08-20 NOTE — Congregational Nurse Program (Signed)
  Dept: [phone]   Congregational Nurse Program Note  Date of Encounter: 08/05/2023  Past Medical History: Past Medical History:  Diagnosis Date   Anemia    Arthritis    Chronic gout    04-01-2023  pt stated last episode > 20 yrs ago   CKD (chronic kidney disease), stage III (HCC)    Headache(784.0)    History of adenomatous polyp of colon    History of right breast cancer 1982   Hyperlipidemia, mixed    Hypertension    Hypothyroidism    endocrinologist-- dr Elvera Lennox;  hx benign nodule bx 1998   Mild obstructive sleep apnea    04-01-2023  per pt uses oral appliance nightly   Mobitz type 1 second degree atrioventricular block 12/2011   cardiologist--- dr Allyson Sabal;  dx 06/ 2013  w/ bradycardia;  nuclear stress test 01-23-2012  normal perfusion no ischemia, nuclear ef 66%;  echo 01-16-2012 ef 60-65%, G1DD, mild MR/ TR   Pre-diabetes    endocrinologist--- dr Elvera Lennox;   (04-01-2023  per pt check blood sugar 2 times wkly)   Thickened endometrium     Encounter Details:  Community Questionnaire - 08/05/23 1333       Questionnaire   Ask client: Do you give verbal consent for me to treat you today? Yes    Student Assistance N/A    Location Patient Served  [facility]    Encounter Setting CN site    Population Status Unknown    Insurance Medicare    Insurance/Financial Assistance Referral N/A    Medication N/A    Medical Provider Yes    Screening Referrals Made N/A    Medical Referrals Made N/A    Medical Appointment Completed N/A    CNP Interventions Educate;Spiritual Care;Advocate/Support    Screenings CN Performed N/A    ED Visit Averted N/A    Life-Saving Intervention Made N/A            The patient expressed concern about her daily intake of water. The patient is demonstrating a proactive approach to her health by setting realistic goals. Patient set a goal to consume water intake. We discussed strategies for increasing water intake and the benefits of  hydration. We scheduled a follow up appointment in 10 days to review progress and adjust goals as necessary. Offered spiritual care and support.   Denny Peon, BSN, RN, CRRN,CMSRN

## 2023-08-20 NOTE — Congregational Nurse Program (Signed)
  Dept: [phone]   Congregational Nurse Program Note  Date of Encounter: 08/12/2023  Past Medical History: Past Medical History:  Diagnosis Date   Anemia    Arthritis    Chronic gout    04-01-2023  pt stated last episode > 20 yrs ago   CKD (chronic kidney disease), stage III (HCC)    Headache(784.0)    History of adenomatous polyp of colon    History of right breast cancer 1982   Hyperlipidemia, mixed    Hypertension    Hypothyroidism    endocrinologist-- dr Elvera Lennox;  hx benign nodule bx 1998   Mild obstructive sleep apnea    04-01-2023  per pt uses oral appliance nightly   Mobitz type 1 second degree atrioventricular block 12/2011   cardiologist--- dr Allyson Sabal;  dx 06/ 2013  w/ bradycardia;  nuclear stress test 01-23-2012  normal perfusion no ischemia, nuclear ef 66%;  echo 01-16-2012 ef 60-65%, G1DD, mild MR/ TR   Pre-diabetes    endocrinologist--- dr Elvera Lennox;   (04-01-2023  per pt check blood sugar 2 times wkly)   Thickened endometrium     Encounter Details:  Community Questionnaire - 08/12/23 1419       Questionnaire   Ask client: Do you give verbal consent for me to treat you today? Yes    Student Assistance N/A    Location Patient Served  [facility]    Encounter Setting CN site    Population Status Unknown    Insurance Medicare    Insurance/Financial Assistance Referral N/A    Medication N/A    Medical Provider Yes    Screening Referrals Made N/A    Medical Referrals Made N/A    Medical Appointment Completed N/A    CNP Interventions Educate;Spiritual Care;Advocate/Support    Screenings CN Performed N/A    ED Visit Averted N/A    Life-Saving Intervention Made N/A            follow up appointment. Patient state she has been improving her daily intake of water. Patient states making small measurable margins to increase fluid intake makes her goal seem achievable.Patient states she will continue to take her water intake. Educated patient on the  importance and benefit of hydration.

## 2023-09-03 ENCOUNTER — Encounter (HOSPITAL_BASED_OUTPATIENT_CLINIC_OR_DEPARTMENT_OTHER): Payer: Self-pay | Admitting: Obstetrics and Gynecology

## 2023-09-03 NOTE — Progress Notes (Signed)
 Your procedure is scheduled on  :  Wednesday,  09-09-2023  Report to [facility] AT  _9:45  AM.   Call this number if you have problems the morning of surgery:  [phone] Any questions prior to surgery call pre-op nurse,  Ciria Bernardini  :  [phone]   OUR ADDRESS IS [address].  WE ARE LOCATED IN [facility]  PLEASE BRING YOUR INSURANCE CARD AND PHOTO ID DAY OF SURGERY.                                     REMEMBER: Do not eat any food and do not drink any liquid after midnight night before surgery.  This includes no water ,  candy,  gum,  and  mints.   Please brush your teeth morning of surgery and rinse mouth out. _____________________________________________________________________     TAKE ONLY THESE MEDICATIONS MORNING OF SURGERY: May have enough sips of water  with medications Levothyroxine  (synthroid ) Allopurinol  Carvedilol  (coreg ) Rosuvastatin  (crestor )                                       DO NOT WEAR JEWERLY/  METAL/  PIERCINGS (INCLUDING NO PLASTIC PIERCINGS) DO NOT WEAR LOTIONS, POWDERS, PERFUMES OR NAIL POLISH ON YOUR FINGERNAILS. TOENAIL POLISH IS OK TO WEAR. DO NOT SHAVE FOR 48 HOURS PRIOR TO DAY OF SURGERY.  CONTACTS, GLASSES, OR DENTURES MAY NOT BE WORN TO SURGERY.  REMEMBER: NO SMOKING, VAPING ,  DRUGS OR ALCOHOL  FOR 24 HOURS BEFORE YOUR SURGERY.                                    [facility] IS NOT RESPONSIBLE  FOR ANY BELONGINGS.                                                                    SABRA           [place] - Preparing for Surgery Before surgery, you can play an important role.  Because skin is not sterile, your skin needs to be as free of germs as possible.  You can reduce the number of germs on your skin by washing with CHG (chlorahexidine gluconate) soap before surgery.  CHG is an antiseptic cleaner which kills germs and bonds with the skin to continue killing germs even after  washing. Please DO NOT use if you have an allergy to CHG or antibacterial soaps.  If your skin becomes reddened/irritated stop using the CHG and inform your nurse when you arrive at Short Stay. Do not shave (including legs and underarms) for at least 48 hours prior to the first CHG shower.  You may shave your face/neck. Please follow these instructions carefully:  1.  Shower with CHG Soap the night before surgery and the  morning of Surgery.  2.  If you choose to wash your hair, wash your hair first as usual with your  normal  shampoo.  3.  After  you shampoo, rinse your hair and body thoroughly to remove the  shampoo.                                        4.  Use CHG as you would any other liquid soap.  You can apply chg directly  to the skin and wash , chg soap provided, night before and morning of your surgery.  5.  Apply the CHG Soap to your body ONLY FROM THE NECK DOWN.   Do not use on face/ open                           Wound or open sores. Avoid contact with eyes, ears mouth and genitals (private parts).                       Wash face,  Genitals (private parts) with your normal soap.             6.  Wash thoroughly, paying special attention to the area where your surgery  will be performed.  7.  Thoroughly rinse your body with warm water  from the neck down.  8.  DO NOT shower/wash with your normal soap after using and rinsing off  the CHG Soap.             9.  Pat yourself dry with a clean towel.            10.  Wear clean pajamas.            11.  Place clean sheets on your bed the night of your first shower and do not  sleep with pets. Day of Surgery : Do not apply any lotions/ powders the morning of surgery.  Please wear clean clothes to the hospital/surgery center.  IF YOU HAVE ANY SKIN IRRITATION OR PROBLEMS WITH THE SURGICAL SOAP, PLEASE GET A BAR OF GOLD DIAL SOAP AND SHOWER THE NIGHT BEFORE YOUR SURGERY AND THE MORNING OF YOUR SURGERY. PLEASE LET THE NURSE KNOW MORNING OF YOUR  SURGERY IF YOU HAD ANY PROBLEMS WITH THE SURGICAL SOAP.   YOUR SURGEON MAY HAVE REQUESTED EXTENDED RECOVERY TIME AFTER YOUR SURGERY. IT COULD BE A  JUST A FEW HOURS  UP TO AN OVERNIGHT STAY.  YOUR SURGEON SHOULD HAVE DISCUSSED THIS WITH YOU PRIOR TO YOUR SURGERY. IN THE EVENT YOU NEED TO STAY OVERNIGHT PLEASE REFER TO THE FOLLOWING GUIDELINES. YOU MAY HAVE UP TO 4 VISITORS  MAY VISIT IN THE EXTENDED RECOVERY ROOM UNTIL 800 PM ONLY.  ONE  VISITOR AGE 64 AND OVER MAY SPEND THE NIGHT AND MUST BE IN EXTENDED RECOVERY ROOM NO LATER THAN 800 PM . YOUR DISCHARGE TIME AFTER YOU SPEND THE NIGHT IS 900 AM THE MORNING AFTER YOUR SURGERY. YOU MAY PACK A SMALL OVERNIGHT BAG WITH TOILETRIES FOR YOUR OVERNIGHT STAY IF YOU WISH.  REGARDLESS OF IF YOU STAY OVER NIGHT OR ARE DISCHARGED THE SAME DAY YOU WILL BE REQUIRED TO HAVE A RESPONSIBLE ADULT (18 YRS OLD OR OLDER) STAY WITH YOU FOR AT LEAST THE FIRST 24 HOURS WHEN HOME  YOUR PRESCRIPTION MEDICATIONS WILL BE PROVIDED DURING [facility] STAY.  ________________________________________________________________________

## 2023-09-03 NOTE — Progress Notes (Signed)
 Spoke w/ via phone for pre-op interview--- pt Lab needs dos----    no   (current EKG in epic/ chart)  Lab results------ lab appt 09-04-2023 @ 1330 getting CBC/ BMP/ T&S COVID test -----patient states asymptomatic no test needed Arrive at ------- 0945 on 09-09-2023 NPO after MN , sips water  w/ meds Med rec completed Medications to take morning of surgery ----- synthroid , allopurinol , coreg , crestor  Diabetic medication ----- do not take metformin  morning of surgery Patient instructed no nail polish to be worn day of surgery Patient instructed to bring photo id and insurance card day of surgery Patient aware to have Driver (ride ) / caregiver    for 24 hours after surgery - daughter, [place]  Patient Special Instructions ----- will pick up bag w/ hibiclens  and written instructions at lab appt Asked to call w/ any questions Pre-Op special Instructions ----- n/a Patient verbalized understanding of instructions that were given at this phone interview. Patient denies chest pain, sob, fever, cough at the interview.

## 2023-09-04 ENCOUNTER — Encounter (HOSPITAL_COMMUNITY)
Admission: RE | Admit: 2023-09-04 | Discharge: 2023-09-04 | Disposition: A | Discharge status: Home / Self Care | Payer: Medicare Other | Source: Ambulatory Visit | Attending: Obstetrics and Gynecology

## 2023-09-04 DIAGNOSIS — I519 Heart disease, unspecified: Secondary | ICD-10-CM | POA: Insufficient documentation

## 2023-09-04 DIAGNOSIS — Z01812 Encounter for preprocedural laboratory examination: Secondary | ICD-10-CM | POA: Insufficient documentation

## 2023-09-04 DIAGNOSIS — Z01818 Encounter for other preprocedural examination: Secondary | ICD-10-CM

## 2023-09-04 LAB — BASIC METABOLIC PANEL
Anion gap: 8 (ref 5–15)
BUN: 26 mg/dL — ABNORMAL HIGH (ref 8–23)
CO2: 23 mmol/L (ref 22–32)
Calcium: 9.7 mg/dL (ref 8.9–10.3)
Chloride: 109 mmol/L (ref 98–111)
Creatinine, Ser: 1.03 mg/dL — ABNORMAL HIGH (ref 0.44–1.00)
GFR, Estimated: 57 mL/min — ABNORMAL LOW (ref >60)
Glucose, Bld: 95 mg/dL (ref 70–99)
Potassium: 4.4 mmol/L (ref 3.5–5.1)
Sodium: 140 mmol/L (ref 135–145)

## 2023-09-04 LAB — CBC
HCT: 32.8 % — ABNORMAL LOW (ref 36.0–46.0)
Hemoglobin: 10.2 g/dL — ABNORMAL LOW (ref 12.0–15.0)
MCH: 27.3 pg (ref 26.0–34.0)
MCHC: 31.1 g/dL (ref 30.0–36.0)
MCV: 87.7 fL (ref 80.0–100.0)
Platelets: 248 10*3/uL (ref 150–400)
RBC: 3.74 MIL/uL — ABNORMAL LOW (ref 3.87–5.11)
RDW: 16.6 % — ABNORMAL HIGH (ref 11.5–15.5)
WBC: 5.8 10*3/uL (ref 4.0–10.5)
nRBC: 0 % (ref 0.0–0.2)

## 2023-09-08 NOTE — H&P (Signed)
73 y.o.  complains of uterine prolapse, cystocele and discomfort with gel horn pessary.  Previously:"pt presents today with c/o intermittent pelvic pain - midline X 35month  tylenol for pain helps sometimes  no bleeding -AG   Previously: "Pt. here for pre op visit; D&C hysteroscopy scheduled 09/11; Pt. has gellhorn Pessary in place; Pt. saw Cards on 07/23 and got clearance per pt/tb//  Previously:"Thickened endo, may have been there since 2013 but uncertain and also now worsened. D/w pt need tissue diagnosis and d/w her EMB vs. D&C, hysteroscopy. D/w pt latter more likely to get better tissue and be more comfortable with anesthesia.  Pt may have had a "blockage" in artery but was able to do surgery in 2013. Pt to see Cards immediately for fitness for D&C, hysterscopy in office, will try for August. "  Benign endo polyp removed. Now still having some midline pain. No bleeding. When put the new pessary in, did not hurt. Then started some severe cramping lasted a few minutes back and forth. Stops completely and now not every day. Pain can move up to around naval. No dysuria. "  D&C, hysteroscopy revealed benign polyp.  Pt cleared again by cards.    Past Medical History:  Diagnosis Date   Anemia    Arthritis    Cervicalgia    Chronic gout    04-01-2023  pt stated last episode > 20 yrs ago   CKD (chronic kidney disease), stage III (HCC)    Cystocele with uterine prolapse    Headache(784.0)    History of adenomatous polyp of colon    GI-- dr Chales Abrahams   History of right breast cancer 1982   Hyperlipidemia, mixed    Hypertension    Hypothyroidism    endocrinologist-- dr Elvera Lennox;  hx benign nodule bx 1998   Mild obstructive sleep apnea    09-03-2023  per pt last used oral appliance since after 09/ 2024 had issue w/ appliance then issue w/ medicare had not got fixed yet   Mobitz type 1 second degree atrioventricular block 12/2011   cardiologist--- dr Allyson Sabal;  dx 06/ 2013  w/ bradycardia;   nuclear stress test 01-23-2012  normal perfusion no ischemia, nuclear ef 66%;  echo 01-16-2012 ef 60-65%, G1DD, mild MR/ TR   Type 2 diabetes mellitus [facility])    endocrinologist--- dr Elvera Lennox;   (09-03-2023 per pt check blood sugar 2 times wkly)   Urethral hypermobility    Past Surgical History:  Procedure Laterality Date   BREAST RECONSTRUCTION Right 1983   COLONOSCOPY  12/2017   dr Lottie Mussel   HYSTEROSCOPY WITH D & C N/A 04/08/2023   Procedure: DILATATION AND CURETTAGE /HYSTEROSCOPY;  Surgeon: Carrington Clamp, MD;  Location: [facility];  Service: Gynecology;  Laterality: N/A;   LAPAROSCOPY  11/11/2011   Procedure: LAPAROSCOPY OPERATIVE;  Surgeon: Miguel Aschoff, MD;  Location: [facility];  Service: Gynecology;  Laterality: N/A;   MASTECTOMY Right 1982   RIGHT BREAST    REPAIR TENDONS FOOT Right 1999   right ankle   RIGHT ANKLE REPAIR Right 1989   SALPINGOOPHORECTOMY  11/11/2011   Procedure: SALPINGO OOPHERECTOMY;  Surgeon: Miguel Aschoff, MD;  Location: [facility];  Service: Gynecology;  Laterality: Bilateral;   SINUS ENDO WITH FUSION Left 01/07/2021   Procedure: SINUS ENDOSCOPY WITH FUSION NAVIGATION;  Surgeon: Newman Pies, MD;  Location: [facility];  Service: ENT;  Laterality: Left;   SPHENOIDECTOMY Left 01/07/2021   Procedure: LEFT ENDOSCOPIC  SPHENOIDECTOMY WITH TISSUE  REMOVAL;  Surgeon: Newman Pies, MD;  Location:  SURGERY CENTER;  Service: ENT;  Laterality: Left;   TUBAL LIGATION  1974    Social History   Socioeconomic History   Marital status: Divorced    Spouse name: Not on file   Number of children: 2   Years of education: Not on file   Highest education level: Not on file  Occupational History   Occupation: Scientist, 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[employer]     Comment: retired  Tobacco Use   Smoking status: Never   Smokeless tobacco: Never  Vaping Use   Vaping status: Never Used  Substance and Sexual Activity   Alcohol use: No   Drug  use: Never   Sexual activity: Not on file  Other Topics Concern   Not on file  Social History Narrative   Not on file   Social Drivers of Health   Financial Resource Strain: Not on file  Food Insecurity: Not on file  Transportation Needs: Not on file  Physical Activity: Not on file  Stress: Not on file  Social Connections: Not on file  Intimate Partner Violence: Not on file    No current facility-administered medications on file prior to encounter.   Current Outpatient Medications on File Prior to Encounter  Medication Sig Dispense Refill   acetaminophen (TYLENOL) 650 MG CR tablet Take 650 mg by mouth every 8 (eight) hours as needed for pain.     allopurinol (ZYLOPRIM) 300 MG tablet Take 300 mg by mouth daily.     Berberine Chloride (BERBERINE HCI) 500 MG CAPS Take 1 capsule by mouth daily after lunch.     carvedilol (COREG) 3.125 MG tablet Take 3.125 mg by mouth 2 (two) times daily with a meal.     levothyroxine (SYNTHROID) 100 MCG tablet Take 1 tablet by mouth once daily (Patient taking differently: Take 100 mcg by mouth daily.) 90 tablet 0   losartan (COZAAR) 50 MG tablet Take 50 mg by mouth daily.     meloxicam (MOBIC) 15 MG tablet Take 15 mg by mouth daily as needed for pain.     metFORMIN (GLUCOPHAGE-XR) 500 MG 24 hr tablet Take 1 tablet (500 mg total) by mouth daily with breakfast. (Patient taking differently: Take 500 mg by mouth daily after breakfast.) 90 tablet 3   Multiple Vitamin (MULTIVITAMIN) capsule Take 1 capsule by mouth daily.     rosuvastatin (CRESTOR) 20 MG tablet Take 20 mg by mouth daily.     triamterene-hydrochlorothiazide (DYAZIDE) 37.5-25 MG capsule Take 1 capsule by mouth daily.     Blood Glucose Monitoring Suppl (ONE TOUCH ULTRA 2) w/Device KIT Check blood sugar 1 time daily 1 kit 0   glucose blood (ONETOUCH VERIO) test strip Use as instructed to check blood sugar 1X daily 100 each 12   Lancets (ONETOUCH ULTRASOFT) lancets Use as instructed to check blood  sugar 1X daily 100 each 12   OneTouch Delica Lancets 30G MISC Check blood sugar 1 time daly 100 each 12    No Known Allergies  Vitals:   09/03/23 1651  Weight: 92.5 kg  Height: 5\' 5"  (1.651 m)    Lungs: clear to ascultation Cor:  RRR Abdomen:  soft, nontender, nondistended. Ex:  no cords, erythema Pelvic:   Vulva: no masses, no atrophy, no lesions Vagina: no tenderness, no erythema, no abnormal vaginal discharge, no vesicle(s) or ulcers, cystocele (to 0), rectocele (-1) Cervix: grossly normal, no discharge, no  cervical motion tenderness Uterus: normal size, normal shape, midline, mobile, non-tender, uterine prolapse (some) Bladder/Urethra: no urethral discharge, no urethral mass, bladder non distended, Urethra hypermobile (>45) Adnexa/Parametria: no parametrial tenderness, no parametrial mass, no adnexal tenderness, no ovarian mass  A:  For TVH, TVT, A repair and cysto.  Possible salpingectomies.   P: P: All risks, benefits and alternatives d/w patient and she desires to proceed.  Patient has undergone an ERAS protocol and will receive preop antibiotics and SCDs during the operation.   Pt to have extended recovery but will go home same day if eating, ambulating, and pain control is good.  Voiding trial before d/c.    Loney Laurence

## 2023-09-09 ENCOUNTER — Encounter (HOSPITAL_BASED_OUTPATIENT_CLINIC_OR_DEPARTMENT_OTHER): Payer: Self-pay | Admitting: Obstetrics and Gynecology

## 2023-09-09 ENCOUNTER — Encounter (HOSPITAL_BASED_OUTPATIENT_CLINIC_OR_DEPARTMENT_OTHER)
Admission: RE | Disposition: A | Discharge status: Home / Self Care | Payer: Self-pay | Source: Home / Self Care | Attending: Obstetrics and Gynecology

## 2023-09-09 ENCOUNTER — Other Ambulatory Visit: Payer: Self-pay

## 2023-09-09 ENCOUNTER — Ambulatory Visit (HOSPITAL_BASED_OUTPATIENT_CLINIC_OR_DEPARTMENT_OTHER)
Admission: RE | Admit: 2023-09-09 | Discharge: 2023-09-10 | Disposition: A | Discharge status: Home / Self Care | Payer: Medicare Other | Attending: Obstetrics and Gynecology | Admitting: Obstetrics and Gynecology

## 2023-09-09 ENCOUNTER — Ambulatory Visit (HOSPITAL_BASED_OUTPATIENT_CLINIC_OR_DEPARTMENT_OTHER): Payer: Medicare Other | Admitting: Anesthesiology

## 2023-09-09 DIAGNOSIS — D259 Leiomyoma of uterus, unspecified: Secondary | ICD-10-CM | POA: Diagnosis not present

## 2023-09-09 DIAGNOSIS — D631 Anemia in chronic kidney disease: Secondary | ICD-10-CM | POA: Diagnosis not present

## 2023-09-09 DIAGNOSIS — E039 Hypothyroidism, unspecified: Secondary | ICD-10-CM | POA: Diagnosis not present

## 2023-09-09 DIAGNOSIS — E1122 Type 2 diabetes mellitus with diabetic chronic kidney disease: Secondary | ICD-10-CM | POA: Diagnosis not present

## 2023-09-09 DIAGNOSIS — N888 Other specified noninflammatory disorders of cervix uteri: Secondary | ICD-10-CM | POA: Diagnosis not present

## 2023-09-09 DIAGNOSIS — Z90721 Acquired absence of ovaries, unilateral: Secondary | ICD-10-CM | POA: Diagnosis not present

## 2023-09-09 DIAGNOSIS — I129 Hypertensive chronic kidney disease with stage 1 through stage 4 chronic kidney disease, or unspecified chronic kidney disease: Secondary | ICD-10-CM | POA: Diagnosis not present

## 2023-09-09 DIAGNOSIS — Y658 Other specified misadventures during surgical and medical care: Secondary | ICD-10-CM | POA: Diagnosis not present

## 2023-09-09 DIAGNOSIS — N814 Uterovaginal prolapse, unspecified: Secondary | ICD-10-CM | POA: Insufficient documentation

## 2023-09-09 DIAGNOSIS — N72 Inflammatory disease of cervix uteri: Secondary | ICD-10-CM | POA: Diagnosis not present

## 2023-09-09 DIAGNOSIS — M1990 Unspecified osteoarthritis, unspecified site: Secondary | ICD-10-CM | POA: Diagnosis not present

## 2023-09-09 DIAGNOSIS — Z7984 Long term (current) use of oral hypoglycemic drugs: Secondary | ICD-10-CM | POA: Diagnosis not present

## 2023-09-09 DIAGNOSIS — Z9851 Tubal ligation status: Secondary | ICD-10-CM | POA: Insufficient documentation

## 2023-09-09 DIAGNOSIS — N183 Chronic kidney disease, stage 3 unspecified: Secondary | ICD-10-CM | POA: Insufficient documentation

## 2023-09-09 DIAGNOSIS — Z9889 Other specified postprocedural states: Secondary | ICD-10-CM

## 2023-09-09 DIAGNOSIS — G4733 Obstructive sleep apnea (adult) (pediatric): Secondary | ICD-10-CM | POA: Diagnosis not present

## 2023-09-09 DIAGNOSIS — S3719XA Other injury of ureter, initial encounter: Secondary | ICD-10-CM | POA: Diagnosis not present

## 2023-09-09 DIAGNOSIS — N9971 Accidental puncture and laceration of a genitourinary system organ or structure during a genitourinary system procedure: Secondary | ICD-10-CM | POA: Insufficient documentation

## 2023-09-09 DIAGNOSIS — Z01818 Encounter for other preprocedural examination: Secondary | ICD-10-CM

## 2023-09-09 HISTORY — DX: Hypermobility of urethra: N36.41

## 2023-09-09 HISTORY — PX: VAGINAL HYSTERECTOMY: SHX2639

## 2023-09-09 HISTORY — DX: Cervicalgia: M54.2

## 2023-09-09 HISTORY — PX: BLADDER SUSPENSION: SHX72

## 2023-09-09 HISTORY — PX: CYSTOCELE REPAIR: SHX163

## 2023-09-09 HISTORY — DX: Type 2 diabetes mellitus without complications: E11.9

## 2023-09-09 HISTORY — PX: CYSTOSCOPY: SHX5120

## 2023-09-09 HISTORY — PX: CYSTO WITH HYDRODISTENSION: SHX5453

## 2023-09-09 HISTORY — DX: Uterovaginal prolapse, unspecified: N81.4

## 2023-09-09 LAB — TYPE AND SCREEN
ABO/RH(D): O POS
Antibody Screen: NEGATIVE

## 2023-09-09 LAB — GLUCOSE, CAPILLARY
Glucose-Capillary: 95 mg/dL (ref 70–99)
Glucose-Capillary: 109 mg/dL — ABNORMAL HIGH (ref 70–99)

## 2023-09-09 LAB — ABO/RH: ABO/RH(D): O POS

## 2023-09-09 SURGERY — HYSTERECTOMY, VAGINAL
Anesthesia: General | Site: Vagina

## 2023-09-09 MED ORDER — TRIAMTERENE-HCTZ 37.5-25 MG PO CAPS: 1.0000 | ORAL_CAPSULE | Freq: Every day | ORAL | Status: DC

## 2023-09-09 MED ORDER — ONDANSETRON HCL 4 MG/2ML IJ SOLN: 4.0000 mg | Freq: Four times a day (QID) | INTRAMUSCULAR | Status: DC | PRN

## 2023-09-09 MED ORDER — LEVOTHYROXINE SODIUM 100 MCG PO TABS
100.0000 ug | ORAL_TABLET | Freq: Every day | ORAL | Status: DC
  Administered 2023-09-10: 100 ug via ORAL
  Filled 2023-09-09 (×2): qty 1

## 2023-09-09 MED ORDER — LIDOCAINE HCL (CARDIAC) PF 100 MG/5ML IV SOSY
PREFILLED_SYRINGE | INTRAVENOUS | Status: DC | PRN
  Administered 2023-09-09: 100 mg via INTRAVENOUS

## 2023-09-09 MED ORDER — DEXAMETHASONE SODIUM PHOSPHATE 10 MG/ML IJ SOLN
INTRAMUSCULAR | Status: AC
  Filled 2023-09-09: qty 1

## 2023-09-09 MED ORDER — PHENYLEPHRINE HCL (PRESSORS) 10 MG/ML IV SOLN
INTRAVENOUS | Status: DC | PRN
  Administered 2023-09-09 (×5): 80 ug via INTRAVENOUS

## 2023-09-09 MED ORDER — FLUORESCEIN SODIUM 10 % IV SOLN
INTRAVENOUS | Status: DC | PRN
  Administered 2023-09-09: 25 mg via INTRAVENOUS

## 2023-09-09 MED ORDER — CEFAZOLIN SODIUM-DEXTROSE 2-4 GM/100ML-% IV SOLN
2.0000 g | INTRAVENOUS | Status: AC
  Administered 2023-09-09: 2 g via INTRAVENOUS

## 2023-09-09 MED ORDER — ROCURONIUM BROMIDE 100 MG/10ML IV SOLN
INTRAVENOUS | Status: DC | PRN
  Administered 2023-09-09: 10 mg via INTRAVENOUS
  Administered 2023-09-09: 70 mg via INTRAVENOUS
  Administered 2023-09-09: 10 mg via INTRAVENOUS

## 2023-09-09 MED ORDER — ONDANSETRON HCL 4 MG PO TABS
4.0000 mg | ORAL_TABLET | Freq: Four times a day (QID) | ORAL | Status: DC | PRN
Start: 2023-09-09 — End: 2023-09-10

## 2023-09-09 MED ORDER — SURGIFLO WITH THROMBIN (HEMOSTATIC MATRIX KIT) OPTIME
TOPICAL | Status: DC | PRN
  Administered 2023-09-09: 1 via TOPICAL

## 2023-09-09 MED ORDER — CEFAZOLIN SODIUM-DEXTROSE 2-4 GM/100ML-% IV SOLN
INTRAVENOUS | Status: AC
  Filled 2023-09-09: qty 100

## 2023-09-09 MED ORDER — ESTRADIOL 0.1 MG/GM VA CREA
TOPICAL_CREAM | VAGINAL | Status: DC | PRN
  Administered 2023-09-09: 1 via VAGINAL

## 2023-09-09 MED ORDER — LACTATED RINGERS IV SOLN: INTRAVENOUS | Status: DC

## 2023-09-09 MED ORDER — LIDOCAINE-EPINEPHRINE (PF) 1 %-1:200000 IJ SOLN
INTRAMUSCULAR | Status: DC | PRN
  Administered 2023-09-09: 10 mL

## 2023-09-09 MED ORDER — MIDAZOLAM HCL 2 MG/2ML IJ SOLN
INTRAMUSCULAR | Status: AC
  Filled 2023-09-09: qty 2

## 2023-09-09 MED ORDER — SUGAMMADEX SODIUM 200 MG/2ML IV SOLN
INTRAVENOUS | Status: DC | PRN
Start: 2023-09-09 — End: 2023-09-09
  Administered 2023-09-09: 200 mg via INTRAVENOUS

## 2023-09-09 MED ORDER — METFORMIN HCL ER 500 MG PO TB24
500.0000 mg | ORAL_TABLET | Freq: Every day | ORAL | Status: DC
  Administered 2023-09-10: 500 mg via ORAL
  Filled 2023-09-09 (×2): qty 1

## 2023-09-09 MED ORDER — OXYCODONE HCL 5 MG/5ML PO SOLN: 5.0000 mg | Freq: Once | ORAL | Status: DC | PRN

## 2023-09-09 MED ORDER — STERILE WATER FOR IRRIGATION IR SOLN
Status: DC | PRN
  Administered 2023-09-09: 1000 mL

## 2023-09-09 MED ORDER — FENTANYL CITRATE (PF) 100 MCG/2ML IJ SOLN
INTRAMUSCULAR | Status: DC | PRN
  Administered 2023-09-09: 100 ug via INTRAVENOUS
  Administered 2023-09-09: 25 ug via INTRAVENOUS
  Administered 2023-09-09: 50 ug via INTRAVENOUS
  Administered 2023-09-09: 25 ug via INTRAVENOUS

## 2023-09-09 MED ORDER — 0.9 % SODIUM CHLORIDE (POUR BTL) OPTIME
TOPICAL | Status: DC | PRN
  Administered 2023-09-09 (×2): 500 mL

## 2023-09-09 MED ORDER — ONDANSETRON HCL 4 MG/2ML IJ SOLN
INTRAMUSCULAR | Status: DC | PRN
Start: 2023-09-09 — End: 2023-09-09
  Administered 2023-09-09: 4 mg via INTRAVENOUS

## 2023-09-09 MED ORDER — LIDOCAINE HCL (PF) 2 % IJ SOLN
INTRAMUSCULAR | Status: AC
  Filled 2023-09-09: qty 5

## 2023-09-09 MED ORDER — EPHEDRINE 5 MG/ML INJ
INTRAVENOUS | Status: AC
  Filled 2023-09-09: qty 5

## 2023-09-09 MED ORDER — ACETAMINOPHEN 10 MG/ML IV SOLN: 1000.0000 mg | Freq: Once | INTRAVENOUS | Status: DC | PRN

## 2023-09-09 MED ORDER — SODIUM CHLORIDE 0.9 % IV SOLN
INTRAVENOUS | Status: DC
Start: 2023-09-09 — End: 2023-09-09

## 2023-09-09 MED ORDER — DOCUSATE SODIUM 100 MG PO CAPS
ORAL_CAPSULE | ORAL | Status: AC
  Filled 2023-09-09: qty 1

## 2023-09-09 MED ORDER — PHENYLEPHRINE 80 MCG/ML (10ML) SYRINGE FOR IV PUSH (FOR BLOOD PRESSURE SUPPORT)
PREFILLED_SYRINGE | INTRAVENOUS | Status: AC
  Filled 2023-09-09: qty 10

## 2023-09-09 MED ORDER — FENTANYL CITRATE (PF) 100 MCG/2ML IJ SOLN: 25.0000 ug | INTRAMUSCULAR | Status: DC | PRN

## 2023-09-09 MED ORDER — OXYCODONE HCL 5 MG PO TABS: 5.0000 mg | ORAL_TABLET | Freq: Once | ORAL | Status: DC | PRN

## 2023-09-09 MED ORDER — SODIUM CHLORIDE 0.9 % IR SOLN
Status: DC | PRN
Start: 2023-09-09 — End: 2023-09-09
  Administered 2023-09-09 (×3): 1000 mL via INTRAVESICAL

## 2023-09-09 MED ORDER — MENTHOL 3 MG MT LOZG: 1.0000 | LOZENGE | OROMUCOSAL | Status: DC | PRN

## 2023-09-09 MED ORDER — CARVEDILOL 3.125 MG PO TABS
3.1250 mg | ORAL_TABLET | Freq: Two times a day (BID) | ORAL | Status: DC
  Administered 2023-09-09: 3.125 mg via ORAL
  Filled 2023-09-09: qty 1

## 2023-09-09 MED ORDER — ALLOPURINOL 300 MG PO TABS
300.0000 mg | ORAL_TABLET | Freq: Every day | ORAL | Status: DC
  Filled 2023-09-09: qty 1

## 2023-09-09 MED ORDER — TRAMADOL HCL 50 MG PO TABS
ORAL_TABLET | ORAL | Status: AC
  Filled 2023-09-09: qty 1

## 2023-09-09 MED ORDER — ONDANSETRON HCL 4 MG/2ML IJ SOLN: 4.0000 mg | Freq: Once | INTRAMUSCULAR | Status: DC | PRN

## 2023-09-09 MED ORDER — ONDANSETRON HCL 4 MG/2ML IJ SOLN
INTRAMUSCULAR | Status: AC
  Filled 2023-09-09: qty 2

## 2023-09-09 MED ORDER — SIMETHICONE 80 MG PO CHEW: 80.0000 mg | CHEWABLE_TABLET | Freq: Four times a day (QID) | ORAL | Status: DC | PRN

## 2023-09-09 MED ORDER — LOSARTAN POTASSIUM 50 MG PO TABS
50.0000 mg | ORAL_TABLET | Freq: Every day | ORAL | Status: DC
  Administered 2023-09-09: 50 mg via ORAL
  Filled 2023-09-09: qty 1

## 2023-09-09 MED ORDER — SODIUM CHLORIDE (PF) 0.9 % IJ SOLN
INTRAVENOUS | Status: DC | PRN
  Administered 2023-09-09: 60 mL via INTRAVESICAL

## 2023-09-09 MED ORDER — POVIDONE-IODINE 10 % EX SWAB
2.0000 | Freq: Once | CUTANEOUS | Status: AC
  Administered 2023-09-09: 2 via TOPICAL

## 2023-09-09 MED ORDER — SOD CITRATE-CITRIC ACID 500-334 MG/5ML PO SOLN
30.0000 mL | ORAL | Status: DC
Start: 2023-09-09 — End: 2023-09-09

## 2023-09-09 MED ORDER — FENTANYL CITRATE (PF) 100 MCG/2ML IJ SOLN
INTRAMUSCULAR | Status: AC
  Filled 2023-09-09: qty 2

## 2023-09-09 MED ORDER — DOCUSATE SODIUM 100 MG PO CAPS
100.0000 mg | ORAL_CAPSULE | Freq: Two times a day (BID) | ORAL | Status: DC
  Administered 2023-09-09: 100 mg via ORAL

## 2023-09-09 MED ORDER — HYDROMORPHONE HCL 1 MG/ML IJ SOLN: 0.2000 mg | INTRAMUSCULAR | Status: DC | PRN

## 2023-09-09 MED ORDER — EPHEDRINE SULFATE (PRESSORS) 50 MG/ML IJ SOLN
INTRAMUSCULAR | Status: DC | PRN
  Administered 2023-09-09: 10 mg via INTRAVENOUS

## 2023-09-09 MED ORDER — BISACODYL 5 MG PO TBEC: 5.0000 mg | DELAYED_RELEASE_TABLET | Freq: Every day | ORAL | Status: DC | PRN

## 2023-09-09 MED ORDER — PROPOFOL 10 MG/ML IV BOLUS
INTRAVENOUS | Status: AC
  Filled 2023-09-09: qty 20

## 2023-09-09 MED ORDER — DEXAMETHASONE SODIUM PHOSPHATE 4 MG/ML IJ SOLN
INTRAMUSCULAR | Status: DC | PRN
  Administered 2023-09-09: 10 mg via INTRAVENOUS

## 2023-09-09 MED ORDER — IBUPROFEN 200 MG PO TABS: 600.0000 mg | ORAL_TABLET | Freq: Four times a day (QID) | ORAL | Status: DC | PRN

## 2023-09-09 MED ORDER — TRAMADOL HCL 50 MG PO TABS
50.0000 mg | ORAL_TABLET | Freq: Four times a day (QID) | ORAL | Status: DC | PRN
  Administered 2023-09-09: 50 mg via ORAL

## 2023-09-09 MED ORDER — FLUORESCEIN SODIUM 10 % IV SOLN
INTRAVENOUS | Status: AC
  Filled 2023-09-09: qty 5

## 2023-09-09 MED ORDER — PROPOFOL 10 MG/ML IV BOLUS
INTRAVENOUS | Status: DC | PRN
  Administered 2023-09-09: 120 mg via INTRAVENOUS

## 2023-09-09 MED ORDER — TRIAMTERENE-HCTZ 37.5-25 MG PO TABS
1.0000 | ORAL_TABLET | Freq: Every day | ORAL | Status: DC
Start: 2023-09-09 — End: 2023-09-10
  Administered 2023-09-09: 1 via ORAL
  Filled 2023-09-09: qty 1

## 2023-09-09 SURGICAL SUPPLY — 36 items
BLADE SURG 15 STRL LF DISP TIS (BLADE) ×6 IMPLANT
CATH URETL OPEN 5X70 (CATHETERS) IMPLANT
DERMABOND ADVANCED .7 DNX12 (GAUZE/BANDAGES/DRESSINGS) ×3 IMPLANT
GAUZE 4X4 16PLY ~~LOC~~+RFID DBL (SPONGE) ×3 IMPLANT
GAUZE STRIP PACKING 2INX5YD (MISCELLANEOUS) ×3 IMPLANT
GLOVE BIO SURGEON STRL SZ7 (GLOVE) ×3 IMPLANT
GLOVE BIOGEL PI IND STRL 6.5 (GLOVE) ×3 IMPLANT
GLOVE BIOGEL PI IND STRL 7.0 (GLOVE) ×6 IMPLANT
GOWN STRL REUS W/TWL LRG LVL3 (GOWN DISPOSABLE) ×12 IMPLANT
GUIDEWIRE STR DUAL SENSOR (WIRE) IMPLANT
KIT TURNOVER CYSTO (KITS) ×3 IMPLANT
LIGASURE IMPACT 36 18CM CVD LR (INSTRUMENTS) IMPLANT
MARKER SKIN DUAL TIP RULER LAB (MISCELLANEOUS) IMPLANT
NDL HYPO 22X1.5 SAFETY MO (MISCELLANEOUS) ×3 IMPLANT
NDL SPNL 22GX3.5 QUINCKE BK (NEEDLE) IMPLANT
NEEDLE HYPO 22X1.5 SAFETY MO (MISCELLANEOUS) ×3 IMPLANT
NEEDLE SPNL 22GX3.5 QUINCKE BK (NEEDLE) IMPLANT
NS IRRIG 1000ML POUR BTL (IV SOLUTION) ×3 IMPLANT
PACK VAGINAL WOMENS (CUSTOM PROCEDURE TRAY) ×3 IMPLANT
PAD OB MATERNITY 4.3X12.25 (PERSONAL CARE ITEMS) ×3 IMPLANT
SET IRRIG Y TYPE TUR BLADDER L (SET/KITS/TRAYS/PACK) ×3 IMPLANT
SLEEVE SCD COMPRESS KNEE MED (STOCKING) ×3 IMPLANT
SLING UTERINE/ABD GYNECARE TVT (Sling) ×3 IMPLANT
SPIKE FLUID TRANSFER (MISCELLANEOUS) ×3 IMPLANT
SURGIFLO W/THROMBIN 8M KIT (HEMOSTASIS) ×3 IMPLANT
SUT VIC AB 0 CT1 18XCR BRD8 (SUTURE) ×6 IMPLANT
SUT VIC AB 2-0 CT1 (SUTURE) ×3 IMPLANT
SUT VIC AB 2-0 CT1 TAPERPNT 27 (SUTURE) ×3 IMPLANT
SUT VIC AB 2-0 UR6 27 (SUTURE) IMPLANT
SUT VIC AB 3-0 SH 18 (SUTURE) IMPLANT
SUT VIC AB 3-0 SH 27X BRD (SUTURE) IMPLANT
SYR BULB IRRIG 60ML STRL (SYRINGE) ×3 IMPLANT
SYR TOOMEY IRRIG 70ML (MISCELLANEOUS) ×3 IMPLANT
SYRINGE TOOMEY IRRIG 70ML (MISCELLANEOUS) ×3 IMPLANT
TOWEL OR 17X24 6PK STRL BLUE (TOWEL DISPOSABLE) ×6 IMPLANT
TRAY FOLEY W/BAG SLVR 14FR LF (SET/KITS/TRAYS/PACK) ×3 IMPLANT

## 2023-09-09 NOTE — Op Note (Signed)
09/09/2023  1:31 PM  PATIENT:  Kathryn Diaz  73 y.o. female  PRE-OPERATIVE DIAGNOSIS:  uterine prolapse  POST-OPERATIVE DIAGNOSIS:  uterine prolapse  PROCEDURE:  Procedure(s): HYSTERECTOMY VAGINAL (N/A) TRANSVAGINAL TAPE (TVT) PROCEDURE (N/A) CYSTOSCOPY (N/A) CYSTOSCOPY/ Bladder repair  SURGEON:  Surgeons and Role:    * Noel Christmas, MD- consult    * Charlett Nose, MD - Assisting    * Carrington Clamp, MD- primary   ANESTHESIA:   general  EBL:  150 mL   DRAINS: Urinary Catheter (Foley)   LOCAL MEDICATIONS USED:  LIDOCAINE with epi, estrace, surgiflo, methylene blue, and fluorescein  SPECIMEN:  Source of Specimen:  uterus, cervix  DISPOSITION OF SPECIMEN:  PATHOLOGY  COUNTS:  YES  TOURNIQUET:  * No tourniquets in log *  DICTATION: .Note written in EPIC  PLAN OF CARE: Admit for overnight observation  PATIENT DISPOSITION:  PACU - hemodynamically stable.   Delay start of Pharmacological VTE agent (>24hrs) due to surgical blood loss or risk of bleeding: not applicable  Complications: none   Findings: 7 week size uterus and normal ovaries.  Prolapse to the introitus.  Normal ureteral orifaces and no needles in the bladder.   Technique:  After general anesthesia was achieved, the patient was prepped and draped in a sterile fashion and the foley placed in the bladder. The cervix was grasped with a pair of Lahey clamps and injected circumferentially with 0.5% lidocaine with epi. A circumferential incision was made around the cervix with the scalpel at the level of the reflection of the vagina onto the cervix and the posterior cul-de-sac was entered into with Mayo scissors. The long billed duckbill retractor was then placed and the bladder was removed off the cervix carefully with sharp dissection with the Metzenbaums. The the uterosacrals were grasped with a pair heney clamps on either side and secured with a Heaney stitch of 0 Vicryl. Cardinal ligament was  then divided with alternating successive bites of the Heaney clamp followed by incision with the Mayo scissors and secured with stitches of 0 Vicryl at the level of the cornua Heaneys were placed bilaterally around the entire pedicle and the uterus was able to be amputated.  The pedicles were secured with a free tie of 0 vicryl.    At this point, the bladder was filling despite the foley and appeared to be very thin.  On trying to find the anterior peritoneum, a small hole was created in the bladder wall via the manipulation of moving it out of the way.  Methylene blue was injected into the foley and the hole identifed as being about 5 mm.  A stitch of 3-0 vicryl was used to close the defect.  Another stitch was placed over it and there was still some spill from the area.  A third stitch was used to include some stronger fascial tissue as well as the bladder tissue as an oversew.  At this point Dr. Arita Miss joined [place] and proceeded with cystocopy.  She assessed the patency of the repair and made sure both ureters were working and away from the repair.  She felt it was safe to proceed with the TVT and anterior repair.  Hemostasis of the pedicles had been achieved the peritoneum was pulled down posterior over the bladder.  The peritoneum was sutured to the inner edge of the vagina to provide some strength to the bladder wall and then the peritoneum was closed in a pursestring fashion including the bilateral uterosacrals and  posteriorly in and out of the vagina and a partial Halbans culdoplasty.  The stitch was pulled taut closing the peritoneum and pulling the uterosacrals together through the modified Hall bands and through the vagina. The Foley was still in place.  The anterior vaginal mucosa was then entered into in the midline with the help of Alice's after being injected with lidocaine with epi with the scalpel. The vaginal mucosa was then reflected off of the vesicouterine fascia with careful sharp dissection  with the Metzenbaums until the pelvic floor could feel be felt underneath the pubic bone. 2 small stab incisions are made on 2 cm either side of the midline just above the pubic bone. The abdominal needles were placed carefully through the pelvic floor and out the vagina. The Foley was removed and the cystoscope placed inside the bladder; there were no needles in the bladder, there was still good spillage of dye from the bilateral ureteral orifices and the trigone and urethra were clear.  The foley was replaced. The abdominal needles were attached the vaginal needles and the tape pulled through and cut into place. A Tresa Endo was used to ensure that the there was no tension placed on the tape. Once I was satisfied that the sling was in the correct place and at no tension, the anterior repair was not done secondary the TVT did most of the cystocele. Hemostasis was achieved with surgilfo which was also placed in the abdominal incisions. The vaginal mucosa was trimmed and closed with a running locked stitch of 2-0 Vicryl. The cuff was then closed with interrupted figure-of-eight stitches of 0 Vicryl. A vaginal pack was placed inside the vagina with Estrace cream and the patient tolerated the procedure was returned to recovery in stable condition.   Kathryn Diaz A

## 2023-09-09 NOTE — Anesthesia Postprocedure Evaluation (Signed)
Anesthesia Post Note  Patient: Kathryn Diaz  Procedure(s) Performed: HYSTERECTOMY VAGINAL (Vagina ) TRANSVAGINAL TAPE (TVT) PROCEDURE (Vagina ) ANTERIOR REPAIR (CYSTOCELE) CYSTOSCOPY CYSTOSCOPY/     Patient location during evaluation: PACU Anesthesia Type: General Level of consciousness: awake and alert Pain management: pain level controlled Vital Signs Assessment: post-procedure vital signs reviewed and stable Respiratory status: spontaneous breathing, nonlabored ventilation, respiratory function stable and patient connected to nasal cannula oxygen Cardiovascular status: blood pressure returned to baseline and stable Postop Assessment: no apparent nausea or vomiting Anesthetic complications: no   No notable events documented.  Last Vitals:  Vitals:   09/09/23 1415 09/09/23 1439  BP: 117/65   Pulse: (!) 57 (!) 58  Resp: (!) 9 18  Temp:    SpO2: 99% 100%    Last Pain:  Vitals:   09/09/23 0940  TempSrc: Oral  PainSc: 0-No pain                 Mariann Barter

## 2023-09-09 NOTE — Anesthesia Procedure Notes (Signed)
Procedure Name: Intubation Date/Time: 09/09/2023 10:48 AM  Performed by: Jessica Priest, CRNAPre-anesthesia Checklist: Patient identified, Emergency Drugs available, Suction available, Patient being monitored and Timeout performed Patient Re-evaluated:Patient Re-evaluated prior to induction Oxygen Delivery Method: Circle system utilized Preoxygenation: Pre-oxygenation with 100% oxygen Induction Type: IV induction Ventilation: Mask ventilation without difficulty Laryngoscope Size: Mac and 3 Grade View: Grade II Tube type: Oral Tube size: 7.0 mm Number of attempts: 1 Airway Equipment and Method: Stylet and Oral airway Placement Confirmation: ETT inserted through vocal cords under direct vision, positive ETCO2, breath sounds checked- equal and bilateral and CO2 detector Secured at: 22 cm Tube secured with: Tape Dental Injury: Teeth and Oropharynx as per pre-operative assessment

## 2023-09-09 NOTE — Transfer of Care (Signed)
Immediate Anesthesia Transfer of Care Note  Patient: Kathryn Diaz  Procedure(s) Performed: Procedure(s) (LRB): HYSTERECTOMY VAGINAL (N/A) TRANSVAGINAL TAPE (TVT) PROCEDURE (N/A) ANTERIOR REPAIR (CYSTOCELE) (N/A) CYSTOSCOPY (N/A) CYSTOSCOPY/  Patient Location: PACU  Anesthesia Type: GA  Level of Consciousness: awake, sedated, patient cooperative and responds to stimulation, c/o pain in back - comfort measures given w/ medication   Airway & Oxygen Therapy: Patient Spontanous Breathing and Patient connected to [facility] oxygen  Post-op Assessment: Report given to PACU RN, Post -op Vital signs reviewed and stable and Patient moving all extremities  Post vital signs: Reviewed and stable  Complications: No apparent anesthesia complications

## 2023-09-09 NOTE — Brief Op Note (Addendum)
09/09/2023  1:31 PM  PATIENT:  Kathryn Diaz  73 y.o. female  PRE-OPERATIVE DIAGNOSIS:  uterine prolapse  POST-OPERATIVE DIAGNOSIS:  uterine prolapse  PROCEDURE:  Procedure(s): HYSTERECTOMY VAGINAL (N/A) TRANSVAGINAL TAPE (TVT) PROCEDURE (N/A) ANTERIOR REPAIR (CYSTOCELE) (N/A) CYSTOSCOPY (N/A) CYSTOSCOPY/ Bladder repair, Anterior repair was NOT done  SURGEON:  Surgeons and Role:    * Noel Christmas, MD- consult    * Charlett Nose, MD - Assisting    * Carrington Clamp, MD- primary   ANESTHESIA:   general  EBL:  150 mL   DRAINS: Urinary Catheter (Foley)   LOCAL MEDICATIONS USED:  LIDOCAINE with epi, estrace, surgiflo, methylene blue, and fluorescein  SPECIMEN:  Source of Specimen:  uterus, cervix  DISPOSITION OF SPECIMEN:  PATHOLOGY  COUNTS:  YES  TOURNIQUET:  * No tourniquets in log *  DICTATION: .Note written in EPIC  PLAN OF CARE: Admit for overnight observation  PATIENT DISPOSITION:  PACU - hemodynamically stable.   Delay start of Pharmacological VTE agent (>24hrs) due to surgical blood loss or risk of bleeding: not applicable

## 2023-09-09 NOTE — Progress Notes (Signed)
There has been no change in the patients history, status or exam since the history and physical.  Vitals:   09/03/23 1651  Weight: 92.5 kg  Height: 5\' 5"  (1.651 m)    No results found for this or any previous visit (from the past 72 hours).  Kathryn Diaz

## 2023-09-09 NOTE — Op Note (Signed)
Operative Note  Preoperative diagnosis:  1.  Bladder injury  Postoperative diagnosis: 1.  Bladder injury  Procedure(s): 1.  Diagnostic cystoscopy  Surgeon: Kasandra Knudsen, MD  Assistants:  None  Anesthesia:  General  Complications:  None  EBL:  none  Specimens: 1. none  Drains/Catheters: 1.  none  Intraoperative findings:   Normal urethra Posterior bladder wall with small area approx 1cm with trauma Bilateral efflux from Uos Bladder shape somewhat distorted from ongoing surgery  Indication:  Kathryn Diaz is a 73 y.o. female in OR for TVH, TVT and anterior repair with Dr. Henderson Cloud. Urology consulted for bladder injury.  Description of procedure:  The patient was under general anesthesia and prepped and draped in the usual sterile fashion when urology was consulted.  A 19.5 rigid cystoscope was placed in the urethral meatus and advanced into the bladder under direct visualization.  There was a 1 cm x 1 cm area in the posterior bladder wall that showed some mild erythema and oozing.  This was the location noted to have a bladder injury that had been oversewn.  The bladder filled easily without any evidence of extravasation.  Due to prolapse and intraoperative surgical instruments, the bladder appeared somewhat distorted.  With a finger in the vagina giving gentle anterior pressure, the right ureteral orifice was identified.  Clear Efflux was seen.  The left side was more difficult to locate.  Intraoperative fluorescein was given.  Again E flux was seen from the left side.  Please note the ureteral orifices are close to the bladder neck.  The case was turned back over to GYN.  Plan: Recommend keeping Foley catheter for approximately 10 days prior to voiding trial to allow bladder to heal.

## 2023-09-09 NOTE — Anesthesia Preprocedure Evaluation (Signed)
Anesthesia Evaluation  Patient identified by MRN, date of birth, ID band Patient awake    Reviewed: Allergy & Precautions, NPO status , Patient's Chart, lab work & pertinent test results, reviewed documented beta blocker date and time   History of Anesthesia Complications Negative for: history of anesthetic complications  Airway Mallampati: II  TM Distance: >3 FB     Dental no notable dental hx.    Pulmonary sleep apnea , neg COPD, neg PE   breath sounds clear to auscultation       Cardiovascular hypertension, (-) angina (-) CAD, (-) Past MI and (-) Cardiac Stents + dysrhythmias  Rhythm:Regular Rate:Normal     Neuro/Psych  Headaches, neg Seizures    GI/Hepatic ,neg GERD  ,,(+) neg Cirrhosis        Endo/Other  diabetes, Type 2Hypothyroidism    Renal/GU CRFRenal disease     Musculoskeletal  (+) Arthritis ,    Abdominal   Peds  Hematology  (+) Blood dyscrasia, anemia   Anesthesia Other Findings   Reproductive/Obstetrics                             Anesthesia Physical Anesthesia Plan  ASA: 2  Anesthesia Plan: General   Post-op Pain Management:    Induction: Intravenous  PONV Risk Score and Plan: 2 and Ondansetron and Dexamethasone  Airway Management Planned: Oral ETT  Additional Equipment:   Intra-op Plan:   Post-operative Plan: Extubation in OR  Informed Consent: I have reviewed the patients History and Physical, chart, labs and discussed the procedure including the risks, benefits and alternatives for the proposed anesthesia with the patient or authorized representative who has indicated his/her understanding and acceptance.     Dental advisory given  Plan Discussed with: CRNA  Anesthesia Plan Comments:        Anesthesia Quick Evaluation

## 2023-09-10 ENCOUNTER — Encounter (HOSPITAL_BASED_OUTPATIENT_CLINIC_OR_DEPARTMENT_OTHER): Payer: Self-pay | Admitting: Urology

## 2023-09-10 DIAGNOSIS — N814 Uterovaginal prolapse, unspecified: Secondary | ICD-10-CM | POA: Diagnosis not present

## 2023-09-10 DIAGNOSIS — I129 Hypertensive chronic kidney disease with stage 1 through stage 4 chronic kidney disease, or unspecified chronic kidney disease: Secondary | ICD-10-CM | POA: Diagnosis not present

## 2023-09-10 DIAGNOSIS — N72 Inflammatory disease of cervix uteri: Secondary | ICD-10-CM | POA: Diagnosis not present

## 2023-09-10 DIAGNOSIS — D259 Leiomyoma of uterus, unspecified: Secondary | ICD-10-CM | POA: Diagnosis not present

## 2023-09-10 DIAGNOSIS — G4733 Obstructive sleep apnea (adult) (pediatric): Secondary | ICD-10-CM | POA: Diagnosis not present

## 2023-09-10 DIAGNOSIS — N888 Other specified noninflammatory disorders of cervix uteri: Secondary | ICD-10-CM | POA: Diagnosis not present

## 2023-09-10 LAB — GLUCOSE, CAPILLARY: Glucose-Capillary: 157 mg/dL — ABNORMAL HIGH (ref 70–99)

## 2023-09-10 LAB — COMPREHENSIVE METABOLIC PANEL
ALT: 11 U/L (ref 0–44)
AST: 14 U/L — ABNORMAL LOW (ref 15–41)
Albumin: 2.8 g/dL — ABNORMAL LOW (ref 3.5–5.0)
Alkaline Phosphatase: 37 U/L — ABNORMAL LOW (ref 38–126)
Anion gap: 6 (ref 5–15)
BUN: 27 mg/dL — ABNORMAL HIGH (ref 8–23)
CO2: 21 mmol/L — ABNORMAL LOW (ref 22–32)
Calcium: 8.8 mg/dL — ABNORMAL LOW (ref 8.9–10.3)
Chloride: 110 mmol/L (ref 98–111)
Creatinine, Ser: 1.21 mg/dL — ABNORMAL HIGH (ref 0.44–1.00)
GFR, Estimated: 47 mL/min — ABNORMAL LOW (ref >60)
Glucose, Bld: 111 mg/dL — ABNORMAL HIGH (ref 70–99)
Potassium: 4.1 mmol/L (ref 3.5–5.1)
Sodium: 137 mmol/L (ref 135–145)
Total Bilirubin: 0.3 mg/dL (ref 0.0–1.2)
Total Protein: 5.5 g/dL — ABNORMAL LOW (ref 6.5–8.1)

## 2023-09-10 LAB — CBC
HCT: 28.6 % — ABNORMAL LOW (ref 36.0–46.0)
Hemoglobin: 9 g/dL — ABNORMAL LOW (ref 12.0–15.0)
MCH: 27.7 pg (ref 26.0–34.0)
MCHC: 31.5 g/dL (ref 30.0–36.0)
MCV: 88 fL (ref 80.0–100.0)
Platelets: 242 10*3/uL (ref 150–400)
RBC: 3.25 MIL/uL — ABNORMAL LOW (ref 3.87–5.11)
RDW: 16.3 % — ABNORMAL HIGH (ref 11.5–15.5)
WBC: 13.9 10*3/uL — ABNORMAL HIGH (ref 4.0–10.5)
nRBC: 0 % (ref 0.0–0.2)

## 2023-09-10 NOTE — Discharge Instructions (Signed)
Call [facility] in regards to your follow up appointment with Dr. Judie Petit. Arita Miss, MD. Do Not Remove the urinary catheter, this will be done by Dr. Arita Miss, MD in her office in the next 10 days. [phone] Do Not Take your Blood Pressure medicine today.

## 2023-09-10 NOTE — Discharge Summary (Signed)
Physician Discharge Summary  Patient ID: Kathryn Diaz MRN: [id number] DOB/AGE: [birth year] 73 y.o.  Admit date: 09/09/2023 Discharge date: 09/10/2023  Admission Diagnoses:  Discharge Diagnoses:  Principal Problem:   Postoperative state   Discharged Condition: good  Hospital Course: TVH, TVT, cysto done.  A 5 mm hole was made in the bladder and repaired wtihtout further comp.  Dr. Arita Miss consulted to make sure ureters and trigone were not involved and repair was patent.  Pt kept over night for obs.   Consults: urology  Significant Diagnostic Studies: labs:  Results for orders placed or performed during the hospital encounter of 09/09/23 (from the past 24 hours)  ABO/Rh     Status: None   Collection Time: 09/09/23 10:06 AM  Result Value Ref Range   ABO/RH(D)      O POS Performed at [facility], [address] [place], [place] [address]   Glucose, capillary     Status: None   Collection Time: 09/09/23 10:06 AM  Result Value Ref Range   Glucose-Capillary 95 70 - 99 mg/dL  Glucose, capillary     Status: Abnormal   Collection Time: 09/09/23  2:40 PM  Result Value Ref Range   Glucose-Capillary 109 (H) 70 - 99 mg/dL  CBC     Status: Abnormal   Collection Time: 09/10/23  3:23 AM  Result Value Ref Range   WBC 13.9 (H) 4.0 - 10.5 K/uL   RBC 3.25 (L) 3.87 - 5.11 MIL/uL   Hemoglobin 9.0 (L) 12.0 - 15.0 g/dL   HCT 19.1 (L) 47.8 - 29.5 %   MCV 88.0 80.0 - 100.0 fL   MCH 27.7 26.0 - 34.0 pg   MCHC 31.5 30.0 - 36.0 g/dL   RDW 62.1 (H) 30.8 - 65.7 %   Platelets 242 150 - 400 K/uL   nRBC 0.0 0.0 - 0.2 %  Comprehensive metabolic panel     Status: Abnormal   Collection Time: 09/10/23  3:23 AM  Result Value Ref Range   Sodium 137 135 - 145 mmol/L   Potassium 4.1 3.5 - 5.1 mmol/L   Chloride 110 98 - 111 mmol/L   CO2 21 (L) 22 - 32 mmol/L   Glucose, Bld 111 (H) 70 - 99 mg/dL   BUN 27 (H) 8 - 23 mg/dL   Creatinine, Ser 8.46 (H) 0.44 - 1.00 mg/dL   Calcium 8.8  (L) 8.9 - 10.3 mg/dL   Total Protein 5.5 (L) 6.5 - 8.1 g/dL   Albumin 2.8 (L) 3.5 - 5.0 g/dL   AST 14 (L) 15 - 41 U/L   ALT 11 0 - 44 U/L   Alkaline Phosphatase 37 (L) 38 - 126 U/L   Total Bilirubin 0.3 0.0 - 1.2 mg/dL   GFR, Estimated 47 (L) >60 mL/min   Anion gap 6 5 - 15     Treatments: surgery: TVH. TVT cystoscopy.    Discharge Exam: Blood pressure (!) 98/56, pulse (!) 54, temperature 98.2 F (36.8 C), temperature source Oral, resp. rate 18, height 5\' 5"  (1.651 m), weight 93.7 kg, SpO2 97%.   Disposition: Discharge disposition: 01-Home or Self Care       Discharge Instructions     Call MD for:  severe uncontrolled pain   Complete by: As directed    Call MD for:  temperature >100.4   Complete by: As directed    Diet - low sodium heart healthy   Complete by: As directed    Discharge instructions  Complete by: As directed    No driving on narcotics, no sexual activity for 2 weeks.   Increase activity slowly   Complete by: As directed    May shower / Bathe   Complete by: As directed    Shower, no bath for 2 weeks.   Remove dressing in 24 hours   Complete by: As directed    Sexual Activity Restrictions   Complete by: As directed    No sexual activity for 2 weeks.      Allergies as of 09/10/2023   No Known Allergies      Medication List     TAKE these medications    acetaminophen 650 MG CR tablet Commonly known as: TYLENOL Take 650 mg by mouth every 8 (eight) hours as needed for pain.   allopurinol 300 MG tablet Commonly known as: ZYLOPRIM Take 300 mg by mouth daily.   Berberine HCI 500 MG Caps Generic drug: Berberine Chloride Take 1 capsule by mouth daily after lunch.   carvedilol 3.125 MG tablet Commonly known as: COREG Take 3.125 mg by mouth 2 (two) times daily with a meal.   levothyroxine 100 MCG tablet Commonly known as: SYNTHROID Take 1 tablet by mouth once daily   losartan 50 MG tablet Commonly known as: COZAAR Take 50 mg by mouth  daily.   meloxicam 15 MG tablet Commonly known as: MOBIC Take 15 mg by mouth daily as needed for pain.   metFORMIN 500 MG 24 hr tablet Commonly known as: GLUCOPHAGE-XR Take 1 tablet (500 mg total) by mouth daily with breakfast. What changed: when to take this   multivitamin capsule Take 1 capsule by mouth daily.   ONE TOUCH ULTRA 2 w/Device Kit Check blood sugar 1 time daily   onetouch ultrasoft lancets Use as instructed to check blood sugar 1X daily   OneTouch Delica Lancets 30G Misc Check blood sugar 1 time daly   OneTouch Verio test strip Generic drug: glucose blood Use as instructed to check blood sugar 1X daily   rosuvastatin 20 MG tablet Commonly known as: CRESTOR Take 20 mg by mouth daily.   triamterene-hydrochlorothiazide 37.5-25 MG capsule Commonly known as: DYAZIDE Take 1 capsule by mouth daily.      Tramadol sent from office  Tylenol over the counter   Follow-up Information     Carrington Clamp, MD Follow up in 1 week(s).   Specialty: Obstetrics and Gynecology Why: voiding trial on next Thur at 8:30 Contact information: [address] [place] [address] [phone]                 Signed: Loney Laurence 09/10/2023, 8:03 AM

## 2023-09-10 NOTE — Progress Notes (Signed)
Patient is eating, ambulating, not voiding- but goo u/o through cath.  Pain control is good.  Vitals:   09/10/23 0159 09/10/23 0203 09/10/23 0326 09/10/23 0708  BP: (!) 91/47 (!) 93/47 107/61 (!) 98/56  Pulse: 90  (!) 57 (!) 54  Resp: 16  18   Temp: 98.2 F (36.8 C)  97.9 F (36.6 C) 98.2 F (36.8 C)  TempSrc: Oral  Oral Oral  SpO2: 96%  97% 97%  Weight:      Height:        lungs:   clear to auscultation cor:    RRR Abdomen:  soft, appropriate tenderness, incisions intact and without erythema or exudate. ex:    no cords   Lab Results  Component Value Date   WBC 13.9 (H) 09/10/2023   HGB 9.0 (L) 09/10/2023   HCT 28.6 (L) 09/10/2023   MCV 88.0 09/10/2023   PLT 242 09/10/2023    A/P  Routine care.  Expect d/c per plan.  Will check with Dr. Arita Miss that Cr is what is expected.  Push fluids and hold HTN meds until lunch time.

## 2023-09-10 NOTE — Progress Notes (Signed)
[facility] Nursing Note: Spoke with Dr. Henderson Cloud, MD in regards to client being DC to home with urinary catheter device remaining in place. Informed Dr. Henderson Cloud, MD that this nurse spoke with Dr. Arita Miss, Judie Petit, MD with Urology and per Dr. Arita Miss, patient is to maintain urinary catheter at home and will follow up in office with Dr. Arita Miss with [facility] for follow up examination and in office cysto to be done in the 10 days. Dr. Henderson Cloud, MD did not add any additional orders, except to instruct client she does not need to follow up with Dr. Henderson Cloud, MD next week. Patient instructions in regards to urinary drainage device care and follow up appt with [facility] in 10 days.

## 2023-09-11 LAB — SURGICAL PATHOLOGY

## 2023-09-15 ENCOUNTER — Encounter: Payer: Self-pay | Admitting: Gastroenterology

## 2023-09-21 DIAGNOSIS — S3720XS Unspecified injury of bladder, sequela: Secondary | ICD-10-CM | POA: Diagnosis not present

## 2023-10-14 ENCOUNTER — Telehealth (INDEPENDENT_AMBULATORY_CARE_PROVIDER_SITE_OTHER): Payer: Self-pay | Admitting: Otolaryngology

## 2023-10-14 NOTE — Telephone Encounter (Signed)
 Reminder Call:  Date: 10/15/2023 Status: Sch  Time: 1:50 PM Confirmed time and location-[address] [place], [place] [address]

## 2023-10-15 ENCOUNTER — Encounter (INDEPENDENT_AMBULATORY_CARE_PROVIDER_SITE_OTHER): Payer: Self-pay

## 2023-10-15 ENCOUNTER — Ambulatory Visit (INDEPENDENT_AMBULATORY_CARE_PROVIDER_SITE_OTHER): Payer: Medicare Other | Admitting: Otolaryngology

## 2023-10-15 VITALS — BP 105/66 | HR 72 | Ht 65.0 in | Wt 203.0 lb

## 2023-10-15 DIAGNOSIS — J343 Hypertrophy of nasal turbinates: Secondary | ICD-10-CM

## 2023-10-15 DIAGNOSIS — R0981 Nasal congestion: Secondary | ICD-10-CM

## 2023-10-15 DIAGNOSIS — J310 Chronic rhinitis: Secondary | ICD-10-CM | POA: Diagnosis not present

## 2023-10-15 DIAGNOSIS — H6123 Impacted cerumen, bilateral: Secondary | ICD-10-CM

## 2023-10-15 NOTE — Progress Notes (Signed)
 Patient ID: Kathryn Diaz, female   DOB: [birth year], 73 y.o.   MRN: [id number]  Follow-up: Chronic left sphenoid sinusitis and fungus ball, recurrent cerumen impaction  HPI: The patient is a 73 year old female who returns today for her follow-up evaluation.  She has a history of chronic left sphenoid sinusitis and fungus ball.  She underwent endoscopic sinus surgery in June of 2022.  At her last visit, the patient was noted to have nasal mucosal congestion and bilateral inferior turbinate hypertrophy.  No acute infection was noted at her last visit.  She was treated with nasal saline irrigation as needed.  She denies any nasal drainage, fever, or visual change.  Exam: General: Communicates without difficulty, well nourished, no acute distress. Head: Normocephalic, no evidence injury, no tenderness, facial buttresses intact without stepoff. Face/sinus: No tenderness to palpation and percussion. Facial movement is normal and symmetric. Eyes: PERRL, EOMI. No scleral icterus, conjunctivae clear. Neuro: CN II exam reveals vision grossly intact.  No nystagmus at any point of gaze. Ears: Auricles well formed without lesions.  Bilateral cerumen impaction.  Nose: External evaluation reveals normal support and skin without lesions.  Dorsum is intact.  Anterior rhinoscopy reveals congested mucosa over anterior aspect of inferior turbinates and intact septum.  No purulence noted. Oral:  Oral cavity and oropharynx are intact, symmetric, without erythema or edema.  Mucosa is moist without lesions. Neck: Full range of motion without pain.  There is no significant lymphadenopathy.  No masses palpable.  Thyroid bed within normal limits to palpation.  Parotid glands and submandibular glands equal bilaterally without mass.  Trachea is midline. Neuro:  CN 2-12 grossly intact.   Procedure: Bilateral cerumen disimpaction Anesthesia: None Description: Under the operating microscope, the cerumen is carefully removed with a  combination of cerumen currette, alligator forceps, and suction catheters.  After the cerumen is removed, the TMs are noted to be normal.  No mass, erythema, or lesions. The patient tolerated the procedure well.    Assessment: 1.  Bilateral cerumen impaction.  After the disimpaction procedure, both tympanic membranes and middle ear spaces are noted to be normal. 2.  Chronic rhinitis with nasal mucosal congestion and bilateral inferior turbinate hypertrophy. 3.  No acute or chronic sinusitis is noted today.  Plan: 1.  Otomicroscopy with bilateral cerumen disimpaction. 2.  The physical exam findings are reviewed with the patient. 3.  The patient is reassured that no acute or chronic sinusitis is noted today. 4.  Nasal saline irrigation and Flonase nasal spray as needed. 5.  The patient will return for reevaluation in 6 months.

## 2023-10-17 DIAGNOSIS — J343 Hypertrophy of nasal turbinates: Secondary | ICD-10-CM | POA: Insufficient documentation

## 2023-10-17 DIAGNOSIS — H6123 Impacted cerumen, bilateral: Secondary | ICD-10-CM | POA: Insufficient documentation

## 2023-10-17 DIAGNOSIS — J310 Chronic rhinitis: Secondary | ICD-10-CM | POA: Insufficient documentation

## 2023-10-21 ENCOUNTER — Other Ambulatory Visit: Payer: Self-pay | Admitting: Internal Medicine

## 2023-10-21 DIAGNOSIS — E039 Hypothyroidism, unspecified: Secondary | ICD-10-CM

## 2023-10-28 DIAGNOSIS — K921 Melena: Secondary | ICD-10-CM | POA: Insufficient documentation

## 2023-10-30 ENCOUNTER — Telehealth: Payer: Self-pay | Admitting: Gastroenterology

## 2023-10-30 NOTE — Telephone Encounter (Signed)
 Inbound call from patient stating that her OB had sent a referral for her to be seen for hematochezia. Patient was scheduled with [name] May on 5/9 at 2:00 and is requesting a call to discuss and to also see if she needs to be seen sooner. Please advise.

## 2023-10-30 NOTE — Telephone Encounter (Signed)
 Moved patient to 11/06/23 at 1:30 pm with Jennette Kettle, NP. Pt is aware.

## 2023-11-02 DIAGNOSIS — M109 Gout, unspecified: Secondary | ICD-10-CM | POA: Diagnosis not present

## 2023-11-02 DIAGNOSIS — Z1212 Encounter for screening for malignant neoplasm of rectum: Secondary | ICD-10-CM | POA: Diagnosis not present

## 2023-11-02 DIAGNOSIS — D509 Iron deficiency anemia, unspecified: Secondary | ICD-10-CM | POA: Diagnosis not present

## 2023-11-02 DIAGNOSIS — E039 Hypothyroidism, unspecified: Secondary | ICD-10-CM | POA: Diagnosis not present

## 2023-11-02 DIAGNOSIS — I10 Essential (primary) hypertension: Secondary | ICD-10-CM | POA: Diagnosis not present

## 2023-11-02 DIAGNOSIS — E7849 Other hyperlipidemia: Secondary | ICD-10-CM | POA: Diagnosis not present

## 2023-11-02 DIAGNOSIS — E785 Hyperlipidemia, unspecified: Secondary | ICD-10-CM | POA: Diagnosis not present

## 2023-11-02 DIAGNOSIS — E119 Type 2 diabetes mellitus without complications: Secondary | ICD-10-CM | POA: Diagnosis not present

## 2023-11-02 DIAGNOSIS — D649 Anemia, unspecified: Secondary | ICD-10-CM | POA: Diagnosis not present

## 2023-11-05 DIAGNOSIS — G4733 Obstructive sleep apnea (adult) (pediatric): Secondary | ICD-10-CM | POA: Diagnosis not present

## 2023-11-06 ENCOUNTER — Other Ambulatory Visit: Payer: Self-pay

## 2023-11-06 ENCOUNTER — Telehealth: Payer: Self-pay

## 2023-11-06 ENCOUNTER — Ambulatory Visit: Admitting: Gastroenterology

## 2023-11-06 ENCOUNTER — Other Ambulatory Visit

## 2023-11-06 ENCOUNTER — Encounter: Payer: Self-pay | Admitting: Gastroenterology

## 2023-11-06 VITALS — BP 118/68 | HR 68 | Ht 65.0 in | Wt 209.0 lb

## 2023-11-06 DIAGNOSIS — K921 Melena: Secondary | ICD-10-CM | POA: Diagnosis not present

## 2023-11-06 DIAGNOSIS — D649 Anemia, unspecified: Secondary | ICD-10-CM

## 2023-11-06 LAB — HEMOGLOBIN: Hemoglobin: 9.6 g/dL — ABNORMAL LOW (ref 12.0–15.0)

## 2023-11-06 LAB — IBC + FERRITIN
Ferritin: 7.3 ng/mL — ABNORMAL LOW (ref 10.0–291.0)
Iron: 56 ug/dL (ref 42–145)
Saturation Ratios: 18.6 % — ABNORMAL LOW (ref 20.0–50.0)
TIBC: 301 ug/dL (ref 250.0–450.0)
Transferrin: 215 mg/dL (ref 212.0–360.0)

## 2023-11-06 LAB — HEMATOCRIT: HCT: 29.5 % — ABNORMAL LOW (ref 36.0–46.0)

## 2023-11-06 MED ORDER — SUTAB 1479-225-188 MG PO TABS: ORAL_TABLET | ORAL | 0 refills | Status: DC

## 2023-11-06 NOTE — Progress Notes (Signed)
 Chief Complaint:hematochezia  Primary GI Doctor:Dr. Chales Abrahams  HPI:  Patient is a 73 year old female/female patient with past medical history of history of breast cancer, DM2, HTN, HLD, hypothyroidism, ?OSA, and CKD 2, who was referred to me by Dr. Carrington Clamp on 10/29/23 for a complaint of hematochezia  .    Seen in GI clinic d/t anemia.  Heme-negative stools, negative CT Abdo/pelvis 01/2023 except for endometrial wall thickening   S/P D&C- benign pathology.    Overall doing well except for mild fatigue.  Patient denies having any significant GI complaints except for intermittent LLQ abdominal pain-not related to defecation.  This has been occurring over the last several years.  She had extensive GI evaluation previously by Dr. Kinnie Scales as detailed below.   No nausea, vomiting, heartburn, regurgitation, odynophagia or dysphagia.  No significant diarrhea or constipation.  No melena or hematochezia. No unintentional weight loss.     Stop taking Protonix several years ago.  No heartburn.   She denies having any nosebleeds, easy bruisability, hematuria or any vaginal bleeding.  She is s/p hysterectomy.  No history of craving of ice.  No nonsteroidals.  06/09/23 labs ordered by Dr. Chales Abrahams: Folate and B12 level normal.  Iron 57.  Ferritin 40. Hgb 12.1. 09/04/2023 Hgb 10.2 09/10/23 Hgb 9.0  Past GI workup: (Dr. Kinnie Scales):   EGD 01/31/2020: Normal EGD, GE junction at 40 cm   Colonoscopy 2018 -5 colonic polyps s/p polypectomy -Ascending colon lipoma. -Otherwise normal colonoscopy to TI. -Repeat in 3 years   Colonoscopy 01/31/2020 -No polyps. -Mid ascending colon large lipoma. -Otherwise normal colonoscopy to TI. -Recommended repeat in 5 years   CT AP with contrast 01/2023 1. No acute intra-abdominal or intrapelvic abnormality. 2. Thickened endometrium up to 13 mm. However, endometrium is thick on multiple prior imaging. Correlate clinically to determine the need for further evaluation with  pelvic ultrasound. 3. Multiple other nonacute observations, as described above.   CT Abdo/pelvis 08/2017: neg   Hemoccult cards Jan 2024-ve   SH-divorced, 2 girls, retired    Interval History  Patient presents for follow-up with recent episode of painless rectal bleeding. On Feb 12th patient had hysterectomy with cystocele repair. She states she reported to her GYN/OB at follow-up she had blood in her stool for a few days a few weeks after her procedure. She had her come into office to be evaluated and patient referred to our office for colonoscopy. She has not had any blood in stool since that episode. She denies any constipation or diarrhea. Patient denies nausea, vomiting, or weight loss. Patient takes Meloxicam 1 tablet po daily. No blood thinners. Endorses fatigue.  Wt Readings from Last 3 Encounters:  11/06/23 209 lb (94.8 kg)  10/15/23 203 lb (92.1 kg)  09/09/23 206 lb 9.6 oz (93.7 kg)    Past Medical History:  Diagnosis Date   Anemia    Arthritis    Cervicalgia    Chronic gout    04-01-2023  pt stated last episode > 20 yrs ago   CKD (chronic kidney disease), stage III (HCC)    Cystocele with uterine prolapse    Headache(784.0)    History of adenomatous polyp of colon    GI-- dr Chales Abrahams   History of right breast cancer 1982   Hyperlipidemia, mixed    Hypertension    Hypothyroidism    endocrinologist-- dr Elvera Lennox;  hx benign nodule bx 1998   Mild obstructive sleep apnea    09-03-2023  per pt  last used oral appliance since after 09/ 2024 had issue w/ appliance then issue w/ medicare had not got fixed yet   Mobitz type 1 second degree atrioventricular block 12/2011   cardiologist--- dr Allyson Sabal;  dx 06/ 2013  w/ bradycardia;  nuclear stress test 01-23-2012  normal perfusion no ischemia, nuclear ef 66%;  echo 01-16-2012 ef 60-65%, G1DD, mild MR/ TR   Type 2 diabetes mellitus [facility])    endocrinologist--- dr Elvera Lennox;   (09-03-2023 per pt check blood sugar 2 times wkly)   Urethral  hypermobility     Past Surgical History:  Procedure Laterality Date   BLADDER SUSPENSION N/A 09/09/2023   Procedure: TRANSVAGINAL TAPE (TVT) PROCEDURE;  Surgeon: Noel Christmas, MD;  Location[facility];  Service: Urology;  Laterality: N/A;   BREAST RECONSTRUCTION Right 1983   COLONOSCOPY  12/2017   dr Lottie Mussel   CYSTO WITH HYDRODISTENSION  09/09/2023   Procedure: CYSTOSCOPY/;  Surgeon: Noel Christmas, MD;  Location: [facility];  Service: Urology;;   CYSTOCELE REPAIR N/A 09/09/2023   Procedure: ANTERIOR REPAIR (CYSTOCELE);  Surgeon: Noel Christmas, MD;  Location: [facility];  Service: Urology;  Laterality: N/A;   CYSTOSCOPY N/A 09/09/2023   Procedure: CYSTOSCOPY;  Surgeon: Noel Christmas, MD;  Location: [facility];  Service: Urology;  Laterality: N/A;   HYSTEROSCOPY WITH D & C N/A 04/08/2023   Procedure: DILATATION AND CURETTAGE /HYSTEROSCOPY;  Surgeon: Carrington Clamp, MD;  Location: [facility];  Service: Gynecology;  Laterality: N/A;   LAPAROSCOPY  11/11/2011   Procedure: LAPAROSCOPY OPERATIVE;  Surgeon: Miguel Aschoff, MD;  Location: [facility];  Service: Gynecology;  Laterality: N/A;   MASTECTOMY Right 1982   RIGHT BREAST    REPAIR TENDONS FOOT Right 1999   right ankle   RIGHT ANKLE REPAIR Right 1989   SALPINGOOPHORECTOMY  11/11/2011   Procedure: SALPINGO OOPHERECTOMY;  Surgeon: Miguel Aschoff, MD;  Location: [facility];  Service: Gynecology;  Laterality: Bilateral;   SINUS ENDO WITH FUSION Left 01/07/2021   Procedure: SINUS ENDOSCOPY WITH FUSION NAVIGATION;  Surgeon: Newman Pies, MD;  Location: [facility];  Service: ENT;  Laterality: Left;   SPHENOIDECTOMY Left 01/07/2021   Procedure: LEFT ENDOSCOPIC  SPHENOIDECTOMY WITH TISSUE REMOVAL;  Surgeon: Newman Pies, MD;  Location: [facility];  Service: ENT;  Laterality: Left;   TUBAL LIGATION  1974   VAGINAL HYSTERECTOMY N/A 09/09/2023    Procedure: HYSTERECTOMY VAGINAL;  Surgeon: Noel Christmas, MD;  Location: [facility];  Service: Urology;  Laterality: N/A;    Current Outpatient Medications  Medication Sig Dispense Refill   acetaminophen (TYLENOL) 650 MG CR tablet Take 650 mg by mouth every 8 (eight) hours as needed for pain.     allopurinol (ZYLOPRIM) 300 MG tablet Take 300 mg by mouth daily.     Berberine Chloride (BERBERINE HCI) 500 MG CAPS Take 1 capsule by mouth daily after lunch.     carvedilol (COREG) 3.125 MG tablet Take 3.125 mg by mouth 2 (two) times daily with a meal.     levothyroxine (SYNTHROID) 100 MCG tablet Take 1 tablet by mouth once daily 90 tablet 1   losartan (COZAAR) 50 MG tablet Take 50 mg by mouth daily.     meloxicam (MOBIC) 15 MG tablet Take 15 mg by mouth daily as needed for pain.     metFORMIN (GLUCOPHAGE-XR) 500 MG 24 hr tablet Take 1 tablet (500 mg  total) by mouth daily with breakfast. (Patient taking differently: Take 500 mg by mouth daily after breakfast.) 90 tablet 3   Multiple Vitamin (MULTIVITAMIN) capsule Take 1 capsule by mouth daily.     OneTouch Delica Lancets 30G MISC Check blood sugar 1 time daly 100 each 12   rosuvastatin (CRESTOR) 20 MG tablet Take 20 mg by mouth daily.     triamterene-hydrochlorothiazide (DYAZIDE) 37.5-25 MG capsule Take 1 capsule by mouth daily.     Blood Glucose Monitoring Suppl (ONE TOUCH ULTRA 2) w/Device KIT Check blood sugar 1 time daily (Patient not taking: Reported on 11/06/2023) 1 kit 0   glucose blood (ONETOUCH VERIO) test strip Use as instructed to check blood sugar 1X daily (Patient not taking: Reported on 11/06/2023) 100 each 12   Lancets (ONETOUCH ULTRASOFT) lancets Use as instructed to check blood sugar 1X daily (Patient not taking: Reported on 11/06/2023) 100 each 12   No current facility-administered medications for this visit.    Allergies as of 11/06/2023   (No Known Allergies)    Family History  Problem Relation Age of Onset    Clotting disorder Mother        blood clot   Thyroid disease Sister    Lung cancer Sister    Heart Problems Brother    Kidney disease Brother    Other Brother        cerebral hemorrhage    Review of Systems:    Constitutional: No weight loss, fever, chills, weakness or fatigue HEENT: Eyes: No change in vision               Ears, Nose, Throat:  No change in hearing or congestion Skin: No rash or itching Cardiovascular: No chest pain, chest pressure or palpitations   Respiratory: No SOB or cough Gastrointestinal: See HPI and otherwise negative Genitourinary: No dysuria or change in urinary frequency Neurological: No headache, dizziness or syncope Musculoskeletal: No new muscle or joint pain Hematologic: No bleeding or bruising Psychiatric: No history of depression or anxiety    Physical Exam:  Vital signs: BP 118/68   Pulse 68   Ht 5\' 5"  (1.651 m)   Wt 209 lb (94.8 kg)   BMI 34.78 kg/m   Constitutional: Pleasant  female appears to be in NAD, Well developed, Well nourished, alert and cooperative Throat: Oral cavity and pharynx without inflammation, swelling or lesion.  Respiratory: Respirations even and unlabored. Lungs clear to auscultation bilaterally.   No wheezes, crackles, or rhonchi.  Cardiovascular: Normal S1, S2. Regular rate and rhythm. No peripheral edema, cyanosis or pallor.  Gastrointestinal:  Soft, nondistended, nontender. No rebound or guarding. Normal bowel sounds. No appreciable masses or hepatomegaly. Rectal:  Not performed.  Anoscopy: Msk:  Symmetrical without gross deformities. Without edema, no deformity or joint abnormality.  Neurologic:  Alert and  oriented x4;  grossly normal neurologically.  Skin:   Dry and intact without significant lesions or rashes. Psychiatric: Oriented to person, place and time. Demonstrates good judgement and reason without abnormal affect or behaviors.  RELEVANT LABS AND IMAGING: CBC    Latest Ref Rng & Units 09/10/2023     3:23 AM 09/04/2023    1:38 PM 06/09/2023    3:00 PM  CBC  WBC 4.0 - 10.5 K/uL 13.9  5.8  5.7   Hemoglobin 12.0 - 15.0 g/dL 9.0  95.6  38.7   Hematocrit 36.0 - 46.0 % 28.6  32.8  36.2   Platelets 150 - 400 K/uL 242  248  219.0      CMP     Latest Ref Rng & Units 09/10/2023    3:23 AM 09/04/2023    1:38 PM 06/09/2023    3:00 PM  CMP  Glucose 70 - 99 mg/dL 161  95  97   BUN 8 - 23 mg/dL 27  26  22    Creatinine 0.44 - 1.00 mg/dL 0.96  0.45  4.09   Sodium 135 - 145 mmol/L 137  140  142   Potassium 3.5 - 5.1 mmol/L 4.1  4.4  3.7   Chloride 98 - 111 mmol/L 110  109  106   CO2 22 - 32 mmol/L 21  23  25    Calcium 8.9 - 10.3 mg/dL 8.8  9.7  81.1   Total Protein 6.5 - 8.1 g/dL 5.5   6.7   Total Bilirubin 0.0 - 1.2 mg/dL 0.3   0.5   Alkaline Phos 38 - 126 U/L 37   56   AST 15 - 41 U/L 14   17   ALT 0 - 44 U/L 11   13      Lab Results  Component Value Date   TSH 2.96 01/14/2023     Assessment: Encounter Diagnoses  Name Primary?   Hematochezia Yes   Anemia, unspecified type      Patient presents for follow-up after recent event of painless rectal bleeding that lasted about 3 days with defecation. Hemoglobin dropped from 12.1 in November to 9 in February. No hx diverticular disease. Colonoscopy 01/31/2020 normal. Will recheck H/H and iron panel today. Will proceed with upper GI endoscopy and colonoscopy to evaluate anemia. Pt has been on Meloxicam long term which increases her rick for gastric ulcers. If negative exam, will consider capsule endoscopy.  Plan: -check iron panel,ferritin, and H/H today -No NSAIDs -Schedule for a colonoscopy in [facility] with Dr. Chales Abrahams. The risks and benefits of colonoscopy with possible polypectomy / biopsies were discussed and the patient agrees to proceed.  - Schedule EGD in [facility] with Dr. Chales Abrahams. The risks and benefits of EGD with possible biopsies and esophageal dilation were discussed with the patient who agrees to proceed.  Thank you for the courtesy of  this consult. Please call me with any questions or concerns.   Attie Nawabi, FNP-C [facility] 11/06/2023, 3:38 PM  Cc: Garlan Fillers, MD

## 2023-11-06 NOTE — Telephone Encounter (Signed)
 Left voicemail for patient to call [facility] office if she is available come for EGD and colonoscopy appointment on 11/10/23 at 0830 arrival time.

## 2023-11-06 NOTE — Telephone Encounter (Signed)
 Hello! We have a Kathryn Diaz patient that is seeing [name] today. [name] suggests that she have a double as soon as possible. Dr.Gupta gave permission to book the procedure 4/15 with arrival time of 2:30. Will this work for the [facility]? Please advise. Thank you!

## 2023-11-06 NOTE — Patient Instructions (Addendum)
 No NSAIDs  Advised to go to the ER if there is any severe weakness, severe abdominal pain, vomit blood, dark red blood in your bowel movement, shortness of breath or chest pain.    Your provider has requested that you go to the basement level for lab work before leaving today. Press "B" on the elevator. The lab is located at the first door on the left as you exit the elevator.  Someone from our office will contact you to schedule procedures once we hear back from Dr. Chales Abrahams.   _______________________________________________________  If your blood pressure at your visit was 140/90 or greater, please contact your primary care physician to follow up on this.  _______________________________________________________  If you are age 74 or older, your body mass index should be between 23-30. Your Body mass index is 34.78 kg/m. If this is out of the aforementioned range listed, please consider follow up with your Primary Care Provider.  If you are age 29 or younger, your body mass index should be between 19-25. Your Body mass index is 34.78 kg/m. If this is out of the aformentioned range listed, please consider follow up with your Primary Care Provider.   ________________________________________________________  The [facility] providers would like to encourage you to use [facility] to communicate with providers for non-urgent requests or questions.  Due to long hold times on the telephone, sending your provider a message by [facility] may be a faster and more efficient way to get a response.  Please allow 48 business hours for a response.  Please remember that this is for non-urgent requests.  _______________________________________________________ Thank you for trusting me with your gastrointestinal care!   Reed Pandy, PA

## 2023-11-06 NOTE — Telephone Encounter (Signed)
 Inbound call from patient stating she will not be able to come for 4/15 procedures. Requesting a call to discuss other dates. Please advise, thank you.

## 2023-11-09 ENCOUNTER — Other Ambulatory Visit: Payer: Self-pay | Admitting: Gastroenterology

## 2023-11-09 DIAGNOSIS — Z1331 Encounter for screening for depression: Secondary | ICD-10-CM | POA: Diagnosis not present

## 2023-11-09 DIAGNOSIS — J302 Other seasonal allergic rhinitis: Secondary | ICD-10-CM | POA: Diagnosis not present

## 2023-11-09 DIAGNOSIS — D509 Iron deficiency anemia, unspecified: Secondary | ICD-10-CM | POA: Diagnosis not present

## 2023-11-09 DIAGNOSIS — Z1339 Encounter for screening examination for other mental health and behavioral disorders: Secondary | ICD-10-CM | POA: Diagnosis not present

## 2023-11-09 DIAGNOSIS — I700 Atherosclerosis of aorta: Secondary | ICD-10-CM | POA: Diagnosis not present

## 2023-11-09 DIAGNOSIS — E119 Type 2 diabetes mellitus without complications: Secondary | ICD-10-CM | POA: Diagnosis not present

## 2023-11-09 DIAGNOSIS — N1831 Chronic kidney disease, stage 3a: Secondary | ICD-10-CM | POA: Diagnosis not present

## 2023-11-09 DIAGNOSIS — Z0000 Encounter for general adult medical examination without abnormal findings: Secondary | ICD-10-CM | POA: Diagnosis not present

## 2023-11-09 DIAGNOSIS — E039 Hypothyroidism, unspecified: Secondary | ICD-10-CM | POA: Diagnosis not present

## 2023-11-09 DIAGNOSIS — Z853 Personal history of malignant neoplasm of breast: Secondary | ICD-10-CM | POA: Diagnosis not present

## 2023-11-09 DIAGNOSIS — G4733 Obstructive sleep apnea (adult) (pediatric): Secondary | ICD-10-CM | POA: Diagnosis not present

## 2023-11-09 DIAGNOSIS — R82998 Other abnormal findings in urine: Secondary | ICD-10-CM | POA: Diagnosis not present

## 2023-11-09 DIAGNOSIS — I10 Essential (primary) hypertension: Secondary | ICD-10-CM | POA: Diagnosis not present

## 2023-11-09 MED ORDER — FERROUS GLUCONATE 324 (38 FE) MG PO TABS: 324.0000 mg | ORAL_TABLET | Freq: Every day | ORAL | 3 refills | Status: DC

## 2023-11-09 NOTE — Progress Notes (Signed)
 Prescribed iron 1 tab po daily for IDA discovered on labwork.

## 2023-11-14 LAB — MICROALBUMIN / CREATININE URINE RATIO: Microalb Creat Ratio: <10

## 2023-11-20 NOTE — Telephone Encounter (Signed)
 Patient scheduled for 12/08/23 with Dr Mcgreal

## 2023-11-24 DIAGNOSIS — M25562 Pain in left knee: Secondary | ICD-10-CM | POA: Diagnosis not present

## 2023-11-25 DIAGNOSIS — G4733 Obstructive sleep apnea (adult) (pediatric): Secondary | ICD-10-CM | POA: Diagnosis not present

## 2023-11-30 DIAGNOSIS — G4733 Obstructive sleep apnea (adult) (pediatric): Secondary | ICD-10-CM | POA: Diagnosis not present

## 2023-12-04 ENCOUNTER — Ambulatory Visit: Admitting: Gastroenterology

## 2023-12-07 NOTE — Progress Notes (Unsigned)
 [facility] History and Physical   Primary Care Physician:  Bertha Broad, MD   Reason for Procedure:  Anemia, left lower quadrant abdominal pain, rectal bleeding  Plan:    Upper endoscopy and colonoscopy     HPI: Kathryn Diaz is a 73 y.o. female undergoing upper endoscopy and colonoscopy for evaluation of anemia, left lower quadrant abdominal pain and rectal bleeding.  Patient underwent hysterectomy with cystocele repair in February 2025.  She subsequently had symptoms of rectal bleeding.  EGD and colonoscopy in 2021 were unrevealing.  Did have a history of an adenomatous colon polyp in 2018.  Hemoccult cards - January 2024.  CTAP 01/2023 did not show any acute intra-abdominal abnormality.  Most recent labs 11/06/2023 showed hemoglobin 9.6, hematocrit 29.5, iron 56, ferritin 7.3, TIBC 301.  No family history of colorectal cancer or polyps.   Past Medical History:  Diagnosis Date   Anemia    Arthritis    Cervicalgia    Chronic gout    04-01-2023  pt stated last episode > 20 yrs ago   CKD (chronic kidney disease), stage III (HCC)    Cystocele with uterine prolapse    Headache(784.0)    History of adenomatous polyp of colon    GI-- dr Venice Gillis   History of right breast cancer 1982   Hyperlipidemia, mixed    Hypertension    Hypothyroidism    endocrinologist-- dr Aldona Amel;  hx benign nodule bx 1998   Mild obstructive sleep apnea    09-03-2023  per pt last used oral appliance since after 09/ 2024 had issue w/ appliance then issue w/ medicare had not got fixed yet   Mobitz type 1 second degree atrioventricular block 12/2011   cardiologist--- dr Katheryne Pane;  dx 06/ 2013  w/ bradycardia;  nuclear stress test 01-23-2012  normal perfusion no ischemia, nuclear ef 66%;  echo 01-16-2012 ef 60-65%, G1DD, mild MR/ TR   Type 2 diabetes mellitus [facility])    endocrinologist--- dr Aldona Amel;   (09-03-2023 per pt check blood sugar 2 times wkly)   Urethral hypermobility     Past Surgical  History:  Procedure Laterality Date   BLADDER SUSPENSION N/A 09/09/2023   Procedure: TRANSVAGINAL TAPE (TVT) PROCEDURE;  Surgeon: Roxane Copp, MD;  Location: [facility];  Service: Urology;  Laterality: N/A;   BREAST RECONSTRUCTION Right 1983   COLONOSCOPY  12/2017   dr Aldean Hummingbird   CYSTO WITH HYDRODISTENSION  09/09/2023   Procedure: CYSTOSCOPY/;  Surgeon: Roxane Copp, MD;  Location: [facility];  Service: Urology;;   CYSTOCELE REPAIR N/A 09/09/2023   Procedure: ANTERIOR REPAIR (CYSTOCELE);  Surgeon: Roxane Copp, MD;  Location: [facility];  Service: Urology;  Laterality: N/A;   CYSTOSCOPY N/A 09/09/2023   Procedure: CYSTOSCOPY;  Surgeon: Roxane Copp, MD;  Location: [facility];  Service: Urology;  Laterality: N/A;   HYSTEROSCOPY WITH D & C N/A 04/08/2023   Procedure: DILATATION AND CURETTAGE /HYSTEROSCOPY;  Surgeon: Matt Song, MD;  Location: [facility];  Service: Gynecology;  Laterality: N/A;   LAPAROSCOPY  11/11/2011   Procedure: LAPAROSCOPY OPERATIVE;  Surgeon: Deann Exon, MD;  Location: [facility];  Service: Gynecology;  Laterality: N/A;   MASTECTOMY Right 1982   RIGHT BREAST    REPAIR TENDONS FOOT Right 1999   right ankle   RIGHT ANKLE REPAIR Right 1989   SALPINGOOPHORECTOMY  11/11/2011   Procedure: SALPINGO OOPHERECTOMY;  Surgeon: Deann Exon, MD;  Location: [facility];  Service: Gynecology;  Laterality: Bilateral;   SINUS ENDO WITH FUSION Left 01/07/2021   Procedure: SINUS ENDOSCOPY WITH FUSION NAVIGATION;  Surgeon: Reynold Caves, MD;  Location: [facility];  Service: ENT;  Laterality: Left;   SPHENOIDECTOMY Left 01/07/2021   Procedure: LEFT ENDOSCOPIC  SPHENOIDECTOMY WITH TISSUE REMOVAL;  Surgeon: Reynold Caves, MD;  Location: [facility];  Service: ENT;  Laterality: Left;   TUBAL LIGATION  1974   VAGINAL HYSTERECTOMY N/A 09/09/2023   Procedure: HYSTERECTOMY VAGINAL;   Surgeon: Roxane Copp, MD;  Location: [facility];  Service: Urology;  Laterality: N/A;    Prior to Admission medications   Medication Sig Start Date End Date Taking? Authorizing Provider  acetaminophen  (TYLENOL ) 650 MG CR tablet Take 650 mg by mouth every 8 (eight) hours as needed for pain.    [provider]  allopurinol  (ZYLOPRIM ) 300 MG tablet Take 300 mg by mouth daily.    [provider]  Berberine Chloride (BERBERINE HCI) 500 MG CAPS Take 1 capsule by mouth daily after lunch.    [provider]  Blood Glucose Monitoring Suppl (ONE TOUCH ULTRA 2) w/Device KIT Check blood sugar 1 time daily Patient not taking: Reported on 11/06/2023 03/03/22   Emilie Harden, MD  carvedilol  (COREG ) 3.125 MG tablet Take 3.125 mg by mouth 2 (two) times daily with a meal.    [provider]  ferrous gluconate  (FERGON) 324 MG tablet Take 1 tablet (324 mg total) by mouth daily with breakfast. 11/09/23   May, [name] J, NP  glucose blood (ONETOUCH VERIO) test strip Use as instructed to check blood sugar 1X daily Patient not taking: Reported on 11/06/2023 02/28/22   Emilie Harden, MD  Lancets [facility] ULTRASOFT) lancets Use as instructed to check blood sugar 1X daily Patient not taking: Reported on 11/06/2023 02/28/22   Emilie Harden, MD  levothyroxine  (SYNTHROID ) 100 MCG tablet Take 1 tablet by mouth once daily 10/22/23   Emilie Harden, MD  losartan  (COZAAR ) 50 MG tablet Take 50 mg by mouth daily.    [provider]  meloxicam (MOBIC) 15 MG tablet Take 15 mg by mouth daily as needed for pain.    [provider]  metFORMIN  (GLUCOPHAGE -XR) 500 MG 24 hr tablet Take 1 tablet (500 mg total) by mouth daily with breakfast. Patient taking differently: Take 500 mg by mouth daily after breakfast. 01/15/23   Emilie Harden, MD  Multiple Vitamin (MULTIVITAMIN) capsule Take 1 capsule by mouth daily.    [provider]  OneTouch Delica  Lancets 30G MISC Check blood sugar 1 time daly 11/08/22   Emilie Harden, MD  rosuvastatin (CRESTOR) 20 MG tablet Take 20 mg by mouth daily.    [provider]  Sodium Sulfate-Mag Sulfate-KCl (SUTAB ) 1479-225-188 MG TABS Use as directed for colonoscopy. MANUFACTURER CODES!! BIN: [id number] PCN: CN GROUP: [id number] MEMBER ID: [id number];[employer] AS SECONDARY INSURANCE ;NO PRIOR AUTHORIZATION 11/06/23   May, [name] J, NP  triamterene -hydrochlorothiazide  (DYAZIDE ) 37.5-25 MG capsule Take 1 capsule by mouth daily.    [provider]    Current Outpatient Medications  Medication Sig Dispense Refill   acetaminophen  (TYLENOL ) 650 MG CR tablet Take 650 mg by mouth every 8 (eight) hours as needed for pain.     allopurinol  (ZYLOPRIM ) 300 MG tablet Take 300 mg by mouth daily.     Berberine Chloride (BERBERINE HCI) 500 MG CAPS Take 1 capsule by mouth daily after lunch.  Blood Glucose Monitoring Suppl (ONE TOUCH ULTRA 2) w/Device KIT Check blood sugar 1 time daily (Patient not taking: Reported on 11/06/2023) 1 kit 0   carvedilol  (COREG ) 3.125 MG tablet Take 3.125 mg by mouth 2 (two) times daily with a meal.     ferrous gluconate  (FERGON) 324 MG tablet Take 1 tablet (324 mg total) by mouth daily with breakfast. 30 tablet 3   glucose blood (ONETOUCH VERIO) test strip Use as instructed to check blood sugar 1X daily (Patient not taking: Reported on 11/06/2023) 100 each 12   Lancets (ONETOUCH ULTRASOFT) lancets Use as instructed to check blood sugar 1X daily (Patient not taking: Reported on 11/06/2023) 100 each 12   levothyroxine  (SYNTHROID ) 100 MCG tablet Take 1 tablet by mouth once daily 90 tablet 1   losartan  (COZAAR ) 50 MG tablet Take 50 mg by mouth daily.     meloxicam (MOBIC) 15 MG tablet Take 15 mg by mouth daily as needed for pain.     metFORMIN  (GLUCOPHAGE -XR) 500 MG 24 hr tablet Take 1 tablet (500 mg total) by mouth daily with breakfast. (Patient taking differently: Take 500 mg by mouth daily  after breakfast.) 90 tablet 3   Multiple Vitamin (MULTIVITAMIN) capsule Take 1 capsule by mouth daily.     OneTouch Delica Lancets 30G MISC Check blood sugar 1 time daly 100 each 12   rosuvastatin (CRESTOR) 20 MG tablet Take 20 mg by mouth daily.     Sodium Sulfate-Mag Sulfate-KCl (SUTAB ) 1479-225-188 MG TABS Use as directed for colonoscopy. MANUFACTURER CODES!! BIN: [id number] PCN: CN GROUP: [id number] MEMBER ID: [id number];[employer] AS SECONDARY INSURANCE ;NO PRIOR AUTHORIZATION 24 tablet 0   triamterene -hydrochlorothiazide  (DYAZIDE ) 37.5-25 MG capsule Take 1 capsule by mouth daily.     No current facility-administered medications for this visit.    Allergies as of 12/08/2023   (No Known Allergies)    Family History  Problem Relation Age of Onset   Clotting disorder Mother        blood clot   Thyroid  disease Sister    Lung cancer Sister    Heart Problems Brother    Kidney disease Brother    Other Brother        cerebral hemorrhage    Social History   Socioeconomic History   Marital status: Divorced    Spouse name: Not on file   Number of children: 2   Years of education: Not on file   Highest education level: Not on file  Occupational History   Occupation: Scientist, 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[employer]     Comment: retired  Tobacco Use   Smoking status: Never   Smokeless tobacco: Never  Vaping Use   Vaping status: Never Used  Substance and Sexual Activity   Alcohol  use: No   Drug use: Never   Sexual activity: Not on file  Other Topics Concern   Not on file  Social History Narrative   Not on file   Social Drivers of Health   Financial Resource Strain: Not on file  Food Insecurity: Not on file  Transportation Needs: Not on file  Physical Activity: Not on file  Stress: Not on file  Social Connections: Not on file  Intimate Partner Violence: Not on file    Review of Systems:  All other review of systems negative except as mentioned in the  HPI.  Physical Exam: Vital signs There were no vitals taken for this visit.  General:   Alert,  Well-developed, well-nourished, pleasant and cooperative in NAD Airway:  Mallampati  Lungs:  Clear throughout to auscultation.   Heart:  Regular rate and rhythm; no murmurs, clicks, rubs,  or gallops. Abdomen:  Soft, nontender and nondistended. Normal bowel sounds.   Neuro/Psych:  Normal mood and affect. A and O x 3  Eugenia Hess, MD [facility]

## 2023-12-08 ENCOUNTER — Encounter: Payer: Self-pay | Admitting: Pediatrics

## 2023-12-08 ENCOUNTER — Ambulatory Visit (AMBULATORY_SURGERY_CENTER): Admitting: Pediatrics

## 2023-12-08 VITALS — BP 136/70 | HR 62 | Temp 97.8°F | Resp 16 | Ht 65.0 in | Wt 209.0 lb

## 2023-12-08 DIAGNOSIS — K449 Diaphragmatic hernia without obstruction or gangrene: Secondary | ICD-10-CM | POA: Diagnosis not present

## 2023-12-08 DIAGNOSIS — K633 Ulcer of intestine: Secondary | ICD-10-CM

## 2023-12-08 DIAGNOSIS — R1032 Left lower quadrant pain: Secondary | ICD-10-CM

## 2023-12-08 DIAGNOSIS — B9681 Helicobacter pylori [H. pylori] as the cause of diseases classified elsewhere: Secondary | ICD-10-CM | POA: Diagnosis not present

## 2023-12-08 DIAGNOSIS — K6389 Other specified diseases of intestine: Secondary | ICD-10-CM | POA: Diagnosis not present

## 2023-12-08 DIAGNOSIS — K625 Hemorrhage of anus and rectum: Secondary | ICD-10-CM | POA: Diagnosis not present

## 2023-12-08 DIAGNOSIS — K921 Melena: Secondary | ICD-10-CM

## 2023-12-08 DIAGNOSIS — K2970 Gastritis, unspecified, without bleeding: Secondary | ICD-10-CM | POA: Diagnosis not present

## 2023-12-08 DIAGNOSIS — K635 Polyp of colon: Secondary | ICD-10-CM | POA: Diagnosis not present

## 2023-12-08 DIAGNOSIS — K648 Other hemorrhoids: Secondary | ICD-10-CM

## 2023-12-08 DIAGNOSIS — D649 Anemia, unspecified: Secondary | ICD-10-CM

## 2023-12-08 DIAGNOSIS — K2950 Unspecified chronic gastritis without bleeding: Secondary | ICD-10-CM | POA: Diagnosis not present

## 2023-12-08 MED ORDER — SODIUM CHLORIDE 0.9 % IV SOLN: 500.0000 mL | INTRAVENOUS | Status: DC

## 2023-12-08 NOTE — Op Note (Signed)
 [facility] Patient Name: Kathryn Diaz Procedure Date: 12/08/2023 1:32 PM MRN: [id number] Endoscopist: Eugenia Hess , MD, [id number] Age: 73 Referring MD:  Date of Birth: 21-Jan-1951 Gender: Female Account #: [id number] Procedure:                Upper GI endoscopy Indications:              Abdominal pain in the left lower quadrant, Iron                            deficiency anemia Medicines:                Monitored Anesthesia Care Procedure:                Pre-Anesthesia Assessment:                           - Prior to the procedure, a History and Physical                            was performed, and patient medications and                            allergies were reviewed. The patient's tolerance of                            previous anesthesia was also reviewed. The risks                            and benefits of the procedure and the sedation                            options and risks were discussed with the patient.                            All questions were answered, and informed consent                            was obtained. Prior Anticoagulants: The patient has                            taken no anticoagulant or antiplatelet agents. ASA                            Grade Assessment: III - A patient with severe                            systemic disease. After reviewing the risks and                            benefits, the patient was deemed in satisfactory                            condition to undergo the procedure.  After obtaining informed consent, the endoscope was                            passed under direct vision. Throughout the                            procedure, the patient's blood pressure, pulse, and                            oxygen saturations were monitored continuously. The                            GIF [id number] #[id number] was introduced through the                            mouth, and advanced to the second  part of duodenum.                            The upper GI endoscopy was accomplished without                            difficulty. The patient tolerated the procedure                            well. Scope In: Scope Out: Findings:                 The examined esophagus was normal.                           Patchy mildly erythematous mucosa was found in the                            gastric body. Biopsies were taken with a cold                            forceps for Helicobacter pylori testing.                           The gastric antrum, cardia (on retroflexion) and                            gastric fundus (on retroflexion) were normal.                            Biopsies were taken with a cold forceps for                            Helicobacter pylori testing.                           A small hiatal hernia was present.                           The duodenal bulb and second portion of the  duodenum were normal. Complications:            No immediate complications. Estimated blood loss:                            Minimal. Estimated Blood Loss:     Estimated blood loss was minimal. Impression:               - Normal esophagus.                           - Erythematous mucosa in the gastric body. Biopsied.                           - Normal antrum, cardia and gastric fundus.                            Biopsied.                           - Small hiatal hernia.                           - Normal duodenal bulb and second portion of the                            duodenum. Recommendation:           - Await pathology results.                           - Perform a colonoscopy today.                           - The findings and recommendations were discussed                            with the patient's family. Eugenia Hess, MD 12/08/2023 2:20:40 PM This report has been signed electronically.

## 2023-12-08 NOTE — Progress Notes (Signed)
 Pt A/O x 3, gd SR's, pleased with anesthesia, report to RN

## 2023-12-08 NOTE — Op Note (Signed)
 [facility] Patient Name: Kathryn Diaz Procedure Date: 12/08/2023 1:31 PM MRN: [id number] Endoscopist: Eugenia Hess , MD, [id number] Age: 73 Referring MD:  Date of Birth: 08-01-1950 Gender: Female Account #: [id number] Procedure:                Colonoscopy Indications:              Abdominal pain in the left lower quadrant, Iron                            deficiency anemia, History of adenomatous colon                            polyp, Last colonoscopy 2018 Medicines:                Monitored Anesthesia Care Procedure:                Pre-Anesthesia Assessment:                           - Prior to the procedure, a History and Physical                            was performed, and patient medications and                            allergies were reviewed. The patient's tolerance of                            previous anesthesia was also reviewed. The risks                            and benefits of the procedure and the sedation                            options and risks were discussed with the patient.                            All questions were answered, and informed consent                            was obtained. Prior Anticoagulants: The patient has                            taken no anticoagulant or antiplatelet agents. ASA                            Grade Assessment: III - A patient with severe                            systemic disease. After reviewing the risks and                            benefits, the patient was deemed in satisfactory  condition to undergo the procedure.                           After obtaining informed consent, the colonoscope                            was passed under direct vision. Throughout the                            procedure, the patient's blood pressure, pulse, and                            oxygen saturations were monitored continuously. The                            CF [id number] #[id number] was  introduced through the anus                            and advanced to the ascending colon to examine a                            mass. This was the intended extent. The colonoscopy                            was performed without difficulty. The patient                            tolerated the procedure well. The quality of the                            bowel preparation was good. The rectum was                            photographed -mass lesion was located in the                            proximal ascending colon/cecum and therefore                            landmarks in the cecal region could not be                            confirmed. Scope In: 1:57:27 PM Scope Out: 2:19:33 PM Total Procedure Duration: 0 hours 22 minutes 6 seconds  Findings:                 The perianal and digital rectal examinations were                            normal. Pertinent negatives include normal                            sphincter tone and no palpable rectal lesions.  A fungating partially obstructing large mass was                            found in the proximal ascending colon/or                            potentially protruding from the cecum. The mass was                            circumferential. The mass measured four cm in                            length. No bleeding was present. Biopsies were                            taken with a cold forceps for histology. Area was                            tattooed with an injection of 1 mL of Spot (carbon                            black) 2-3 cm distal to the mass lesion.                           No other significant abnormalities were identified                            in a careful examination of the remainder of the                            colon.                           Internal hemorrhoids were found during retroflexion. Complications:            No immediate complications. Estimated blood loss:                             Minimal. Estimated Blood Loss:     Estimated blood loss was minimal. Impression:               - Likely malignant partially obstructing tumor in                            the proximal ascending colon/cecum. Biopsied.                            Tattooed.                           - Internal hemorrhoids. Recommendation:           - Discharge patient to home (ambulatory).                           - Await pathology results. If malignancy is  confirmed, coordinate CT scan of the chest abdomen                            pelvis and referral to surgical oncology.                           - The findings and recommendations were discussed                            with the patient's family.                           - Return to referring physician.                           - Patient has a contact number available for                            emergencies. The signs and symptoms of potential                            delayed complications were discussed with the                            patient. Return to normal activities tomorrow.                            Written discharge instructions were provided to the                            patient. Eugenia Hess, MD 12/08/2023 2:29:00 PM This report has been signed electronically.

## 2023-12-08 NOTE — Patient Instructions (Addendum)
 Resume previous diet Continue present medications Await pathology results  Handouts/information given for hiatal hernia and hemorrhoids  YOU HAD AN ENDOSCOPIC PROCEDURE TODAY AT THE [facility]:   Refer to the procedure report that was given to you for any specific questions about what was found during the examination.  If the procedure report does not answer your questions, please call your gastroenterologist to clarify.  If you requested that your care partner not be given the details of your procedure findings, then the procedure report has been included in a sealed envelope for you to review at your convenience later.  YOU SHOULD EXPECT: Some feelings of bloating in the abdomen. Passage of more gas than usual.  Walking can help get rid of the air that was put into your GI tract during the procedure and reduce the bloating. If you had a lower endoscopy (such as a colonoscopy or flexible sigmoidoscopy) you may notice spotting of blood in your stool or on the toilet paper. If you underwent a bowel prep for your procedure, you may not have a normal bowel movement for a few days.  Please Note:  You might notice some irritation and congestion in your nose or some drainage.  This is from the oxygen used during your procedure.  There is no need for concern and it should clear up in a day or so.  SYMPTOMS TO REPORT IMMEDIATELY:  Following lower endoscopy (colonoscopy):  Excessive amounts of blood in the stool  Significant tenderness or worsening of abdominal pains  Swelling of the abdomen that is new, acute  Fever of 100F or higher Following upper endoscopy (EGD)  Vomiting of blood or coffee ground material  New chest pain or pain under the shoulder blades  Painful or persistently difficult swallowing  New shortness of breath  Fever of 100F or higher  Black, tarry-looking stools For urgent or emergent issues, a gastroenterologist can be reached at any hour by calling [phone]  [phone]. Do not use MyChart messaging for urgent concerns.   DIET:  We do recommend a small meal at first, but then you may proceed to your regular diet.  Drink plenty of fluids but you should avoid alcoholic beverages for 24 hours.  ACTIVITY:  You should plan to take it easy for the rest of today and you should NOT DRIVE or use heavy machinery until tomorrow (because of the sedation medicines used during the test).    FOLLOW UP: Our staff will call the number listed on your records the next business day following your procedure.  We will call around 7:15- 8:00 am to check on you and address any questions or concerns that you may have regarding the information given to you following your procedure. If we do not reach you, we will leave a message.     If any biopsies were taken you will be contacted by phone or by letter within the next 1-3 weeks.  Please call us  at [phone] if you have not heard about the biopsies in 3 weeks.   SIGNATURES/CONFIDENTIALITY: You and/or your care partner have signed paperwork which will be entered into your electronic medical record.  These signatures attest to the fact that that the information above on your After Visit Summary has been reviewed and is understood.  Full responsibility of the confidentiality of this discharge information lies with you and/or your care-partner.

## 2023-12-08 NOTE — Progress Notes (Signed)
 Called to room to assist during endoscopic procedure.  Patient ID and intended procedure confirmed with present staff. Received instructions for my participation in the procedure from the performing physician.

## 2023-12-09 ENCOUNTER — Telehealth: Payer: Self-pay | Admitting: *Deleted

## 2023-12-09 ENCOUNTER — Telehealth: Payer: Self-pay | Admitting: Pediatrics

## 2023-12-09 LAB — SURGICAL PATHOLOGY

## 2023-12-09 NOTE — Telephone Encounter (Signed)
  Follow up Call-     12/08/2023   12:48 PM  Call back number  Post procedure Call Back phone  # [phone]  Permission to leave phone message Yes     Patient questions:  Do you have a fever, pain , or abdominal swelling? No. Pain Score  0 *  Have you tolerated food without any problems? Yes.    Have you been able to return to your normal activities? Yes.    Do you have any questions about your discharge instructions: Diet   No. Medications  No. Follow up visit  No.  Do you have questions or concerns about your Care? No.  Actions: * If pain score is 4 or above: No action needed, pain <4.

## 2023-12-09 NOTE — Telephone Encounter (Signed)
 I spoke on the telephone with Kathryn Diaz to review the pathology results from her recent upper endoscopy and colonoscopy.  EGD biopsy showed evidence of H. pylori infection with associated gastritis.  She has no prior history of H. pylori infection.  I will recommend quadruple therapy and we will see if her insurance will cover a prescription for Pylera in conjunction with omeprazole.  Biopsies of the ascending colon mass showed evidence of inflammation, architectural distortion and ulceration but no malignant cells.  While this is reassuring, I discussed with Kathryn Diaz that the morphology of the lesion is concerning.  It is possible it is a very large inflammatory polyp.  Alternatively, it is also possible that the biopsies taken were superficial and could have missed malignant cells.  In that setting I recommended proceeding with a repeat colonoscopy for additional biopsies and also scheduling a CT scan of the chest abdomen and pelvis.  Reviewed that we would try to coordinate an overbook colonoscopy for next week.  All questions answered.

## 2023-12-10 ENCOUNTER — Telehealth: Payer: Self-pay

## 2023-12-10 DIAGNOSIS — E785 Hyperlipidemia, unspecified: Secondary | ICD-10-CM | POA: Diagnosis not present

## 2023-12-10 DIAGNOSIS — I129 Hypertensive chronic kidney disease with stage 1 through stage 4 chronic kidney disease, or unspecified chronic kidney disease: Secondary | ICD-10-CM | POA: Diagnosis not present

## 2023-12-10 DIAGNOSIS — E039 Hypothyroidism, unspecified: Secondary | ICD-10-CM | POA: Diagnosis not present

## 2023-12-10 DIAGNOSIS — M109 Gout, unspecified: Secondary | ICD-10-CM | POA: Diagnosis not present

## 2023-12-10 DIAGNOSIS — E1122 Type 2 diabetes mellitus with diabetic chronic kidney disease: Secondary | ICD-10-CM | POA: Diagnosis not present

## 2023-12-10 DIAGNOSIS — Z853 Personal history of malignant neoplasm of breast: Secondary | ICD-10-CM | POA: Diagnosis not present

## 2023-12-10 DIAGNOSIS — G4733 Obstructive sleep apnea (adult) (pediatric): Secondary | ICD-10-CM | POA: Diagnosis not present

## 2023-12-10 DIAGNOSIS — K6389 Other specified diseases of intestine: Secondary | ICD-10-CM

## 2023-12-10 DIAGNOSIS — N1831 Chronic kidney disease, stage 3a: Secondary | ICD-10-CM | POA: Diagnosis not present

## 2023-12-10 MED ORDER — SUTAB 1479-225-188 MG PO TABS: ORAL_TABLET | ORAL | 0 refills | Status: DC

## 2023-12-10 MED ORDER — BISMUTH/METRONIDAZ/TETRACYCLIN 140-125-125 MG PO CAPS: 3.0000 | ORAL_CAPSULE | Freq: Four times a day (QID) | ORAL | 0 refills | Status: DC

## 2023-12-10 MED ORDER — OMEPRAZOLE 20 MG PO CPDR: 20.0000 mg | DELAYED_RELEASE_CAPSULE | Freq: Two times a day (BID) | ORAL | 0 refills | Status: DC

## 2023-12-10 NOTE — Telephone Encounter (Signed)
-----   Message from Truddie Furrow sent at 12/09/2023  5:09 PM EDT ----- Regarding: Coordinating H. pylori treatment and additional tests Hi nurses -  I am touching base to coordinate H. pylori treatment and a repeat colonoscopy for Ms. Sylvan.  EGD this week showed evidence of H. pylori infection 3 capsules p.o. 4 times daily times.  I would like to see if her insurance will cover Pylera.  Please send in a prescription for:   Pylera 3 capsules p.o. 4 times daily x 10 days Omeprazole 20 mg p.o. twice daily (before breakfast and dinner ) x 10 days  She also had a concerning mass lesion in the ascending colon for which biopsies did not show any evidence of malignancy but the lesion is still worrisome for malignancy.  I discussed with her scheduling a CT scan of the chest abdomen and pelvis to evaluate for malignant metastases as well as a repeat colonoscopy next week to obtain additional biopsies.  Please schedule her for the CT scan of the chest abdomen and pelvis ASAP  Can you please see if we can add her on for a colonoscopy on 12/16/2021 or 12/17/2021 at 7:30 AM in the [facility] as an overbook?  She will need bowel prep instructions again.  Please let me know if you have any questions.  Thanks,  Haskell Linker

## 2023-12-10 NOTE — Telephone Encounter (Signed)
 Spoke with Jarmila, RN in the [facility]. She advised me to place arrival time for patient at 6:45 am, procedure would start at 7:30 am. I called and informed patient of appt. Patient will stop by to pick up instructions. Patient knows to pick up prep from pharmacy in addition to 2 prescriptions. Patient verbalized understanding and had no concerns at the end of the call.   Ambulatory referral to GI in epic. Pre certification team notified since procedure is in < 7 days.

## 2023-12-10 NOTE — Telephone Encounter (Signed)
 Called and spoke with patient regarding pathology results and recommendations. Patient is aware that prescriptions for Pylera and Prilosec have been sent to her pharmacy on file. Patient knows to expect a call from radiology scheduling to set up STAT CT chest/abd/pelvis. Patient prefers to come in for repeat colonoscopy on Thursday, 12/17/23 if possible. I told patient that I will check with Endo unit and then call her back to confirm. Patient will stop by the office to pick up new colonoscopy instructions. Allowed time for questions. Patient verbalized understanding and had no concerns at the end of the call.  Prescriptions sent to pharmacy on file.   STAT CT chest/abd/pelvis order in epic. Urgent secure staff message sent to radiology scheduling to contact patient ASAP to schedule.

## 2023-12-11 ENCOUNTER — Telehealth: Payer: Self-pay | Admitting: Pediatrics

## 2023-12-11 DIAGNOSIS — A048 Other specified bacterial intestinal infections: Secondary | ICD-10-CM

## 2023-12-11 DIAGNOSIS — D509 Iron deficiency anemia, unspecified: Secondary | ICD-10-CM

## 2023-12-11 MED ORDER — OMEPRAZOLE 40 MG PO CPDR
40.0000 mg | DELAYED_RELEASE_CAPSULE | Freq: Two times a day (BID) | ORAL | 0 refills | Status: DC
Start: 2023-12-11 — End: 2024-01-15

## 2023-12-11 MED ORDER — METRONIDAZOLE 250 MG PO TABS: 250.0000 mg | ORAL_TABLET | Freq: Four times a day (QID) | ORAL | 0 refills | Status: AC

## 2023-12-11 MED ORDER — BISMUTH SUBSALICYLATE 262 MG PO CHEW: 524.0000 mg | CHEWABLE_TABLET | Freq: Four times a day (QID) | ORAL | 0 refills | Status: AC

## 2023-12-11 MED ORDER — DOXYCYCLINE HYCLATE 100 MG PO TABS: 100.0000 mg | ORAL_TABLET | Freq: Two times a day (BID) | ORAL | 0 refills | Status: AC

## 2023-12-11 NOTE — Telephone Encounter (Signed)
 Inbound call from [facility] pharmacy stating patient's insurance will not cover Pylera. Requesting for medication to be broken down and then called in so insurance will cover. Call back number is [phone]. Please advise, thank you.

## 2023-12-11 NOTE — Telephone Encounter (Signed)
Please see note below and advise. Thanks  

## 2023-12-14 ENCOUNTER — Ambulatory Visit: Payer: Self-pay

## 2023-12-14 ENCOUNTER — Ambulatory Visit (HOSPITAL_COMMUNITY)
Admission: RE | Admit: 2023-12-14 | Discharge: 2023-12-14 | Disposition: A | Discharge status: Home / Self Care | Source: Ambulatory Visit | Attending: Pediatrics | Admitting: Pediatrics

## 2023-12-14 ENCOUNTER — Other Ambulatory Visit: Payer: Self-pay | Admitting: Nephrology

## 2023-12-14 DIAGNOSIS — C50919 Malignant neoplasm of unspecified site of unspecified female breast: Secondary | ICD-10-CM | POA: Diagnosis not present

## 2023-12-14 DIAGNOSIS — K449 Diaphragmatic hernia without obstruction or gangrene: Secondary | ICD-10-CM | POA: Diagnosis not present

## 2023-12-14 DIAGNOSIS — R918 Other nonspecific abnormal finding of lung field: Secondary | ICD-10-CM | POA: Diagnosis not present

## 2023-12-14 DIAGNOSIS — N1831 Chronic kidney disease, stage 3a: Secondary | ICD-10-CM

## 2023-12-14 DIAGNOSIS — R933 Abnormal findings on diagnostic imaging of other parts of digestive tract: Secondary | ICD-10-CM | POA: Diagnosis not present

## 2023-12-14 DIAGNOSIS — E0800 Diabetes mellitus due to underlying condition with hyperosmolarity without nonketotic hyperglycemic-hyperosmolar coma (NKHHC): Secondary | ICD-10-CM | POA: Diagnosis not present

## 2023-12-14 DIAGNOSIS — K7689 Other specified diseases of liver: Secondary | ICD-10-CM | POA: Diagnosis not present

## 2023-12-14 DIAGNOSIS — I700 Atherosclerosis of aorta: Secondary | ICD-10-CM | POA: Insufficient documentation

## 2023-12-14 DIAGNOSIS — K6389 Other specified diseases of intestine: Secondary | ICD-10-CM | POA: Diagnosis not present

## 2023-12-14 LAB — POCT I-STAT CREATININE: Creatinine, Ser: 1.2 mg/dL — ABNORMAL HIGH (ref 0.44–1.00)

## 2023-12-14 MED ORDER — IOHEXOL 9 MG/ML PO SOLN
ORAL | Status: AC
  Filled 2023-12-14: qty 1000

## 2023-12-14 MED ORDER — IOHEXOL 9 MG/ML PO SOLN
500.0000 mL | ORAL | Status: AC
  Administered 2023-12-14 (×2): 500 mL via ORAL

## 2023-12-14 MED ORDER — IOHEXOL 300 MG/ML  SOLN
75.0000 mL | Freq: Once | INTRAMUSCULAR | Status: AC | PRN
  Administered 2023-12-14: 75 mL via INTRAVENOUS

## 2023-12-15 ENCOUNTER — Ambulatory Visit: Payer: Self-pay | Admitting: Pediatrics

## 2023-12-15 ENCOUNTER — Ambulatory Visit
Admission: RE | Admit: 2023-12-15 | Discharge: 2023-12-15 | Disposition: A | Discharge status: Home / Self Care | Source: Ambulatory Visit | Attending: Nephrology | Admitting: Nephrology

## 2023-12-15 DIAGNOSIS — N189 Chronic kidney disease, unspecified: Secondary | ICD-10-CM | POA: Diagnosis not present

## 2023-12-15 DIAGNOSIS — N1831 Chronic kidney disease, stage 3a: Secondary | ICD-10-CM

## 2023-12-15 NOTE — Progress Notes (Signed)
 [facility] History and Physical   Primary Care Physician:  Bertha Broad, MD   Reason for Procedure:  Follow-up proximal ascending colon/cecal mass lesion, anemia, left lower quadrant abdominal pain  Plan:    Colonoscopy   HPI: Kathryn Diaz is a 73 y.o. female undergoing colonoscopy for evaluation of proximal ascending colon/cecal mass lesion, anemia and left lower quadrant abdominal pain.  Kathryn Diaz underwent colonoscopy 12/08/2023 which disclosed evidence of a large mass lesion in the proximal ascending colon/cecum concerning for malignancy.  Biopsies of the lesion showed polypoid colonic mucosa with architectural changes, inflammation and ulceration without dysplasia or malignancy.  Subsequent CT imaging showed a lipoma within the ascending colon.  Along the inferior aspect of the presumed lipoma was a soft tissue density filling the colon lumen at the level of the ileocecal valve potentially representing a colon polyp or carcinoma.  Colonoscopy is repeated today to obtain additional biopsies of the lesion.   Past Medical History:  Diagnosis Date   Anemia    Arthritis    Cancer (HCC) 1982   Breast Cancer   Cervicalgia    Chronic gout    04-01-2023  pt stated last episode > 20 yrs ago   CKD (chronic kidney disease), stage III (HCC)    Cystocele with uterine prolapse    Headache(784.0)    History of adenomatous polyp of colon    GI-- dr Venice Gillis   History of right breast cancer 1982   Hyperlipidemia, mixed    Hypertension    Hypothyroidism    endocrinologist-- dr Aldona Amel;  hx benign nodule bx 1998   Mild obstructive sleep apnea    09-03-2023  per pt last used oral appliance since after 09/ 2024 had issue w/ appliance then issue w/ medicare had not got fixed yet   Mobitz type 1 second degree atrioventricular block 12/2011   cardiologist--- dr Katheryne Pane;  dx 06/ 2013  w/ bradycardia;  nuclear stress test 01-23-2012  normal perfusion no ischemia, nuclear ef 66%;  echo  01-16-2012 ef 60-65%, G1DD, mild MR/ TR   Sleep apnea    Type 2 diabetes mellitus [facility])    endocrinologist--- dr Aldona Amel;   (09-03-2023 per pt check blood sugar 2 times wkly)   Urethral hypermobility     Past Surgical History:  Procedure Laterality Date   BLADDER SUSPENSION N/A 09/09/2023   Procedure: TRANSVAGINAL TAPE (TVT) PROCEDURE;  Surgeon: Roxane Copp, MD;  Location: [facility];  Service: Urology;  Laterality: N/A;   BREAST RECONSTRUCTION Right 1983   COLONOSCOPY  12/2017   dr Aldean Hummingbird   CYSTO WITH HYDRODISTENSION  09/09/2023   Procedure: CYSTOSCOPY/;  Surgeon: Roxane Copp, MD;  Location: [facility];  Service: Urology;;   CYSTOCELE REPAIR N/A 09/09/2023   Procedure: ANTERIOR REPAIR (CYSTOCELE);  Surgeon: Roxane Copp, MD;  Location: [facility];  Service: Urology;  Laterality: N/A;   CYSTOSCOPY N/A 09/09/2023   Procedure: CYSTOSCOPY;  Surgeon: Roxane Copp, MD;  Location: [facility];  Service: Urology;  Laterality: N/A;   HYSTEROSCOPY WITH D & C N/A 04/08/2023   Procedure: DILATATION AND CURETTAGE /HYSTEROSCOPY;  Surgeon: Matt Song, MD;  Location: [facility] [place];  Service: Gynecology;  Laterality: N/A;   LAPAROSCOPY  11/11/2011   Procedure: LAPAROSCOPY OPERATIVE;  Surgeon: Deann Exon, MD;  Location: [facility];  Service: Gynecology;  Laterality: N/A;   MASTECTOMY Right 1982   RIGHT BREAST    REPAIR TENDONS  FOOT Right 1999   right ankle   RIGHT ANKLE REPAIR Right 1989   SALPINGOOPHORECTOMY  11/11/2011   Procedure: SALPINGO OOPHERECTOMY;  Surgeon: Deann Exon, MD;  Location: [facility];  Service: Gynecology;  Laterality: Bilateral;   SINUS ENDO WITH FUSION Left 01/07/2021   Procedure: SINUS ENDOSCOPY WITH FUSION NAVIGATION;  Surgeon: Reynold Caves, MD;  Location: [facility];  Service: ENT;  Laterality: Left;   SPHENOIDECTOMY Left 01/07/2021   Procedure: LEFT ENDOSCOPIC   SPHENOIDECTOMY WITH TISSUE REMOVAL;  Surgeon: Reynold Caves, MD;  Location: [facility];  Service: ENT;  Laterality: Left;   TUBAL LIGATION  1974   VAGINAL HYSTERECTOMY N/A 09/09/2023   Procedure: HYSTERECTOMY VAGINAL;  Surgeon: Roxane Copp, MD;  Location: [facility];  Service: Urology;  Laterality: N/A;    Prior to Admission medications   Medication Sig Start Date End Date Taking? Authorizing Provider  acetaminophen  (TYLENOL ) 650 MG CR tablet Take 650 mg by mouth every 8 (eight) hours as needed for pain.    [provider]  allopurinol  (ZYLOPRIM ) 300 MG tablet Take 300 mg by mouth daily.    [provider]  Berberine Chloride (BERBERINE HCI) 500 MG CAPS Take 1 capsule by mouth daily after lunch.    [provider]  bismuth  subsalicylate (PEPTO-BISMOL) 262 MG chewable tablet Chew 2 tablets (524 mg total) by mouth 4 (four) times daily for 14 days. 12/11/23 12/25/23  Edmonia Gottron, PA-C  Blood Glucose Monitoring Suppl (ONE TOUCH ULTRA 2) w/Device KIT Check blood sugar 1 time daily 03/03/22   Emilie Harden, MD  carvedilol  (COREG ) 3.125 MG tablet Take 3.125 mg by mouth 2 (two) times daily with a meal.    [provider]  doxycycline  (VIBRA -TABS) 100 MG tablet Take 1 tablet (100 mg total) by mouth 2 (two) times daily for 14 days. 12/11/23 12/25/23  Edmonia Gottron, PA-C  ferrous gluconate  [facility]) 324 MG tablet Take 1 tablet (324 mg total) by mouth daily with breakfast. 11/09/23   May, [name] J, NP  glucose blood (ONETOUCH VERIO) test strip Use as instructed to check blood sugar 1X daily Patient not taking: Reported on 11/06/2023 02/28/22   Emilie Harden, MD  Lancets [facility] ULTRASOFT) lancets Use as instructed to check blood sugar 1X daily Patient not taking: Reported on 11/06/2023 02/28/22   Emilie Harden, MD  levothyroxine  (SYNTHROID ) 100 MCG tablet Take 1 tablet by mouth once daily 10/22/23   Emilie Harden, MD  losartan   (COZAAR ) 50 MG tablet Take 50 mg by mouth daily.    [provider]  metFORMIN  (GLUCOPHAGE -XR) 500 MG 24 hr tablet Take 1 tablet (500 mg total) by mouth daily with breakfast. Patient taking differently: Take 500 mg by mouth daily after breakfast. 01/15/23   Emilie Harden, MD  metroNIDAZOLE  (FLAGYL ) 250 MG tablet Take 1 tablet (250 mg total) by mouth 4 (four) times daily for 14 days. 12/11/23 12/25/23  Edmonia Gottron, PA-C  Multiple Vitamin (MULTIVITAMIN) capsule Take 1 capsule by mouth daily.    [provider]  omeprazole  (PRILOSEC) 40 MG capsule Take 1 capsule (40 mg total) by mouth 2 (two) times daily for 14 days. 12/11/23 12/25/23  Edmonia Gottron, PA-C  OneTouch Delica Lancets 30G MISC Check blood sugar 1 time daly 11/08/22   Emilie Harden, MD  rosuvastatin (CRESTOR) 20 MG tablet Take 20 mg by mouth daily.    [provider]  Sodium Sulfate-Mag Sulfate-KCl (SUTAB ) 716 279 3596 MG TABS Use  as directed for colonoscopy. MANUFACTURER CODES!! BIN: [id number] PCN: CN GROUP: [id number] MEMBER ID: [id number];GNF AS SECONDARY INSURANCE ;NO PRIOR AUTHORIZATION 12/10/23   Kerrie Latour, Scarlette Currier, MD  triamterene -hydrochlorothiazide  (DYAZIDE ) 37.5-25 MG capsule Take 1 capsule by mouth daily.    [provider]    Current Outpatient Medications  Medication Sig Dispense Refill   allopurinol  (ZYLOPRIM ) 300 MG tablet Take 300 mg by mouth daily.     Berberine Chloride (BERBERINE HCI) 500 MG CAPS Take 1 capsule by mouth daily after lunch.     bismuth  subsalicylate (PEPTO-BISMOL) 262 MG chewable tablet Chew 2 tablets (524 mg total) by mouth 4 (four) times daily for 14 days. 112 tablet 0   carvedilol  (COREG ) 3.125 MG tablet Take 3.125 mg by mouth 2 (two) times daily with a meal.     doxycycline  (VIBRA -TABS) 100 MG tablet Take 1 tablet (100 mg total) by mouth 2 (two) times daily for 14 days. 28 tablet 0   levothyroxine  (SYNTHROID ) 100 MCG tablet Take 1 tablet by mouth once daily 90  tablet 1   losartan  (COZAAR ) 50 MG tablet Take 50 mg by mouth daily.     metFORMIN  (GLUCOPHAGE -XR) 500 MG 24 hr tablet Take 1 tablet (500 mg total) by mouth daily with breakfast. (Patient taking differently: Take 500 mg by mouth daily after breakfast.) 90 tablet 3   metroNIDAZOLE  (FLAGYL ) 250 MG tablet Take 1 tablet (250 mg total) by mouth 4 (four) times daily for 14 days. 56 tablet 0   omeprazole  (PRILOSEC) 40 MG capsule Take 1 capsule (40 mg total) by mouth 2 (two) times daily for 14 days. 28 capsule 0   OneTouch Delica Lancets 30G MISC Check blood sugar 1 time daly 100 each 12   rosuvastatin (CRESTOR) 20 MG tablet Take 20 mg by mouth daily.     triamterene -hydrochlorothiazide  (DYAZIDE ) 37.5-25 MG capsule Take 1 capsule by mouth daily.     acetaminophen  (TYLENOL ) 650 MG CR tablet Take 650 mg by mouth every 8 (eight) hours as needed for pain.     Blood Glucose Monitoring Suppl (ONE TOUCH ULTRA 2) w/Device KIT Check blood sugar 1 time daily 1 kit 0   ferrous gluconate  (FERGON) 324 MG tablet Take 1 tablet (324 mg total) by mouth daily with breakfast. 30 tablet 3   glucose blood (ONETOUCH VERIO) test strip Use as instructed to check blood sugar 1X daily (Patient not taking: Reported on 11/06/2023) 100 each 12   Lancets (ONETOUCH ULTRASOFT) lancets Use as instructed to check blood sugar 1X daily (Patient not taking: Reported on 11/06/2023) 100 each 12   Multiple Vitamin (MULTIVITAMIN) capsule Take 1 capsule by mouth daily.     Current Facility-Administered Medications  Medication Dose Route Frequency Provider Last Rate Last Admin   0.9 %  sodium chloride  infusion  500 mL Intravenous Continuous Django Nguyen, Scarlette Currier, MD        Allergies as of 12/17/2023   (No Known Allergies)    Family History  Problem Relation Age of Onset   Clotting disorder Mother        blood clot   Thyroid  disease Sister    Lung cancer Sister    Heart Problems Brother    Kidney disease Brother    Other Brother         cerebral hemorrhage   Colon cancer Neg Hx    Colon polyps Neg Hx    Esophageal cancer Neg Hx    Stomach cancer Neg Hx    Ulcerative colitis  Neg Hx     Social History   Socioeconomic History   Marital status: Divorced    Spouse name: Not on file   Number of children: 2   Years of education: Not on file   Highest education level: Not on file  Occupational History   Occupation: Scientist, 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[employer]     Comment: retired  Tobacco Use   Smoking status: Never   Smokeless tobacco: Never  Vaping Use   Vaping status: Never Used  Substance and Sexual Activity   Alcohol  use: No   Drug use: Never   Sexual activity: Not on file  Other Topics Concern   Not on file  Social History Narrative   Not on file   Social Drivers of Health   Financial Resource Strain: Not on file  Food Insecurity: Not on file  Transportation Needs: Not on file  Physical Activity: Not on file  Stress: Not on file  Social Connections: Not on file  Intimate Partner Violence: Not on file    Review of Systems:  All other review of systems negative except as mentioned in the HPI.  Physical Exam: Vital signs BP 137/71   Pulse (!) 58   Temp (!) 97.4 F (36.3 C)   Resp 12   Ht 5\' 5"  (1.651 m)   Wt 209 lb (94.8 kg)   SpO2 100%   BMI 34.78 kg/m   General:   Alert,  Well-developed, well-nourished, pleasant and cooperative in NAD Airway:  Mallampati 2 Lungs:  Clear throughout to auscultation.   Heart:  Regular rate and rhythm; no murmurs, clicks, rubs,  or gallops. Abdomen:  Soft, nontender and nondistended. Normal bowel sounds.   Neuro/Psych:  Normal mood and affect. A and O x 3  Eugenia Hess, MD [facility]

## 2023-12-17 ENCOUNTER — Encounter: Payer: Self-pay | Admitting: Pediatrics

## 2023-12-17 ENCOUNTER — Ambulatory Visit (AMBULATORY_SURGERY_CENTER): Admitting: Pediatrics

## 2023-12-17 ENCOUNTER — Encounter: Payer: Self-pay | Admitting: Cardiovascular Disease

## 2023-12-17 VITALS — BP 120/59 | HR 60 | Temp 97.4°F | Resp 14 | Ht 65.0 in | Wt 209.0 lb

## 2023-12-17 DIAGNOSIS — K6389 Other specified diseases of intestine: Secondary | ICD-10-CM

## 2023-12-17 DIAGNOSIS — G4730 Sleep apnea, unspecified: Secondary | ICD-10-CM | POA: Diagnosis not present

## 2023-12-17 DIAGNOSIS — K633 Ulcer of intestine: Secondary | ICD-10-CM

## 2023-12-17 DIAGNOSIS — K648 Other hemorrhoids: Secondary | ICD-10-CM | POA: Diagnosis not present

## 2023-12-17 DIAGNOSIS — R1032 Left lower quadrant pain: Secondary | ICD-10-CM | POA: Diagnosis not present

## 2023-12-17 DIAGNOSIS — I10 Essential (primary) hypertension: Secondary | ICD-10-CM | POA: Diagnosis not present

## 2023-12-17 DIAGNOSIS — E119 Type 2 diabetes mellitus without complications: Secondary | ICD-10-CM | POA: Diagnosis not present

## 2023-12-17 DIAGNOSIS — K635 Polyp of colon: Secondary | ICD-10-CM | POA: Diagnosis not present

## 2023-12-17 DIAGNOSIS — D649 Anemia, unspecified: Secondary | ICD-10-CM

## 2023-12-17 DIAGNOSIS — E039 Hypothyroidism, unspecified: Secondary | ICD-10-CM | POA: Diagnosis not present

## 2023-12-17 MED ORDER — SODIUM CHLORIDE 0.9 % IV SOLN: 500.0000 mL | INTRAVENOUS | Status: DC

## 2023-12-17 NOTE — Op Note (Signed)
 [facility] Patient Name: Kathryn Diaz Procedure Date: 12/17/2023 7:25 AM MRN: [id number] Endoscopist: Eugenia Hess , MD, [id number] Age: 73 Referring MD:  Date of Birth: April 22, 1951 Gender: Female Account #: [id number] Procedure:                Colonoscopy Indications:              Anemia, Left lower quadrant abdominal pain, Mass                            lesion seen in proximal ascending colon on                            colonoscopy last week with nondiagnostic biopsies,                            Colonoscopy is repeated to obtain additional tissue                            of mass lesion Medicines:                Monitored Anesthesia Care Procedure:                Pre-Anesthesia Assessment:                           - Prior to the procedure, a History and Physical                            was performed, and patient medications and                            allergies were reviewed. The patient's tolerance of                            previous anesthesia was also reviewed. The risks                            and benefits of the procedure and the sedation                            options and risks were discussed with the patient.                            All questions were answered, and informed consent                            was obtained. Prior Anticoagulants: The patient has                            taken no anticoagulant or antiplatelet agents. ASA                            Grade Assessment: III - A patient with severe  systemic disease. After reviewing the risks and                            benefits, the patient was deemed in satisfactory                            condition to undergo the procedure.                           After obtaining informed consent, the colonoscope                            was passed under direct vision. Throughout the                            procedure, the patient's blood pressure, pulse,  and                            oxygen saturations were monitored continuously. The                            Olympus Scope SN: [id number] was introduced through                            the anus and advanced to the cecum, identified by                            appendiceal orifice and ileocecal valve. The                            colonoscopy was performed without difficulty. The                            patient tolerated the procedure well. The quality                            of the bowel preparation was good. The ileocecal                            valve, appendiceal orifice, and rectum were                            photographed. Scope In: 7:43:22 AM Scope Out: 8:09:21 AM Scope Withdrawal Time: 0 hours 10 minutes 27 seconds  Total Procedure Duration: 0 hours 25 minutes 59 seconds  Findings:                 The perianal and digital rectal examinations were                            normal. Pertinent negatives include normal                            sphincter tone and no palpable rectal lesions.  A fungating and polypoid partially obstructing                            large mass was found in the proximal ascending                            colon near the ileocecal valve. The mass was                            partially circumferential (involving two-thirds of                            the lumen circumference). In addition, its diameter                            measured four cm. Portions of the surface of the                            mass lesion were smooth possibly representing                            lipoma and other areas with an adenomatous                            appearance. Biopsies were taken with a cold forceps                            for histology. The colonoscope was able to be                            maneuvered proximal to the lesion on today's                            procedure revealing the base of the cecum.                             Previously placed tattoo was noted distal to the                            mass lesion in the ascending colon.                           The remainder of the colon was normal on careful                            exam. No additional mass lesions or polyps were                            identified.                           Internal hemorrhoids were found during retroflexion. Complications:            No immediate complications. Estimated blood loss:  Minimal. Estimated Blood Loss:     Estimated blood loss was minimal. Impression:               - Rule out malignancy, partially obstructing tumor                            in the proximal ascending colon. Biopsied. Recent                            CT scan showed lipoma in the region of the                            ileocecal valve with adjacent polypoid lesion. It                            is possible there is a polyp or malignancy growing                            in the region of a lipoma.                           - Internal hemorrhoids. Recommendation:           - Discharge patient to home (ambulatory).                           - Await pathology results.                           - The findings and recommendations were discussed                            with the patient's family.                           - Patient has a contact number available for                            emergencies. The signs and symptoms of potential                            delayed complications were discussed with the                            patient. Return to normal activities tomorrow.                            Written discharge instructions were provided to the                            patient. Eugenia Hess, MD 12/17/2023 8:19:17 AM This report has been signed electronically.

## 2023-12-17 NOTE — Patient Instructions (Signed)
 Educational handout provided to patient related to Hemorrhoids  Resume previous diet  Continue present medications  Awaiting pathology results  YOU HAD AN ENDOSCOPIC PROCEDURE TODAY AT THE [facility]:   Refer to the procedure report that was given to you for any specific questions about what was found during the examination.  If the procedure report does not answer your questions, please call your gastroenterologist to clarify.  If you requested that your care partner not be given the details of your procedure findings, then the procedure report has been included in a sealed envelope for you to review at your convenience later.  YOU SHOULD EXPECT: Some feelings of bloating in the abdomen. Passage of more gas than usual.  Walking can help get rid of the air that was put into your GI tract during the procedure and reduce the bloating. If you had a lower endoscopy (such as a colonoscopy or flexible sigmoidoscopy) you may notice spotting of blood in your stool or on the toilet paper. If you underwent a bowel prep for your procedure, you may not have a normal bowel movement for a few days.  Please Note:  You might notice some irritation and congestion in your nose or some drainage.  This is from the oxygen used during your procedure.  There is no need for concern and it should clear up in a day or so.  SYMPTOMS TO REPORT IMMEDIATELY:  Following lower endoscopy (colonoscopy or flexible sigmoidoscopy):  Excessive amounts of blood in the stool  Significant tenderness or worsening of abdominal pains  Swelling of the abdomen that is new, acute  Fever of 100F or higher  For urgent or emergent issues, a gastroenterologist can be reached at any hour by calling [phone]. Do not use MyChart messaging for urgent concerns.    DIET:  We do recommend a small meal at first, but then you may proceed to your regular diet.  Drink plenty of fluids but you should avoid alcoholic beverages for  24 hours.  ACTIVITY:  You should plan to take it easy for the rest of today and you should NOT DRIVE or use heavy machinery until tomorrow (because of the sedation medicines used during the test).    FOLLOW UP: Our staff will call the number listed on your records the next business day following your procedure.  We will call around 7:15- 8:00 am to check on you and address any questions or concerns that you may have regarding the information given to you following your procedure. If we do not reach you, we will leave a message.     If any biopsies were taken you will be contacted by phone or by letter within the next 1-3 weeks.  Please call us at [phone] if you have not heard about the biopsies in 3 weeks.    SIGNATURES/CONFIDENTIALITY: You and/or your care partner have signed paperwork which will be entered into your electronic medical record.  These signatures attest to the fact that that the information above on your After Visit Summary has been reviewed and is understood.  Full responsibility of the confidentiality of this discharge information lies with you and/or your care-partner.

## 2023-12-17 NOTE — Progress Notes (Signed)
 Pt's states no medical or surgical changes since previsit or office visit.

## 2023-12-17 NOTE — Progress Notes (Signed)
 Vss nad trans to pacu

## 2023-12-17 NOTE — Progress Notes (Signed)
 Called to room to assist during endoscopic procedure.  Patient ID and intended procedure confirmed with present staff. Received instructions for my participation in the procedure from the performing physician.

## 2023-12-18 ENCOUNTER — Encounter: Payer: Self-pay | Admitting: Pediatrics

## 2023-12-18 ENCOUNTER — Telehealth: Payer: Self-pay

## 2023-12-18 LAB — SURGICAL PATHOLOGY

## 2023-12-18 NOTE — Telephone Encounter (Signed)
 Left message

## 2023-12-19 ENCOUNTER — Telehealth: Payer: Self-pay | Admitting: Pediatrics

## 2023-12-19 NOTE — Telephone Encounter (Signed)
 I spoke on the telephone with Kathryn Diaz to review the results of her pathology -repeat biopsies again show inflammation, ulceration and benign tissue.  No carcinoma.  Advised that I would review her case with my colleague, Dr. Brice Campi, for an opinion as to whether or not the polypoid lesion can be resected endoscopically or if she would need to be referred to colorectal surgery.  Appropriate referrals will be placed after that decision is made.  We also reviewed that there was a lung lesion noted on her chest CT that raise concern for possible adenocarcinoma.  This warrants referral to thoracic oncology for further evaluation and I will request that our office placed that referral.  All questions answered.

## 2023-12-22 ENCOUNTER — Ambulatory Visit: Payer: Self-pay | Admitting: Pediatrics

## 2023-12-22 ENCOUNTER — Telehealth: Payer: Self-pay

## 2023-12-22 ENCOUNTER — Encounter: Payer: Self-pay | Admitting: Pediatrics

## 2023-12-22 ENCOUNTER — Telehealth: Payer: Self-pay | Admitting: Emergency Medicine

## 2023-12-22 NOTE — Telephone Encounter (Deleted)
-----   Message from [facility] sent at 12/20/2023  9:09 AM EDT ----- Regarding: RE: Endoscopic resection vs. surgery for colon polyp NM, I think that based on the size of this particular lesion and location and difficulty with evaluation of the base, with her underlying iron deficiency, I think that a colonic resection probably makes the most sense since it is also involving the ileocecal valve region.  I would recommend Dr. Terrea Ferrier. White/Dr. Hershell Lose to evaluate for potential partial colectomy.  If she was a nonsurgical candidate, we could see what may be possible.  For the lung lesion, I am putting Dr. Baldwin Levee on here, he is our lead interventional pulmonologist, so that he can look at the CT chest imaging, he may be able to help expedite sampling of that region if he feels that navigational biopsy could be helpful.  RSB, Can you look at this patient's CT chest, is this something that you think you would be able to reach or should a thoracic surgery referral be placed? Thanks as always. GM ----- Message ----- From: Truddie Furrow, MD Sent: 12/19/2023   7:34 AM EDT To: Normie Becton., MD Subject: Endoscopic resection vs. surgery for colon p#  Hi Gabe -  I am touching base to get your input as to whether or not a large proximal ascending colon polyp I found on Ms. Passe can be endoscopically resected or best referred to surgery.  I did her first colonoscopy on 12/08/2023 for left-sided abdominal pain, IDA and history of adenomatous colon polyp.  Her previous colonoscopy was done in 2018.  I found a large polypoid lesion and what I surmised was the proximal ascending colon/cecum although I could not identify cecal landmarks at that time. The  biopsies of the lesion showed only polypoid mucosa with ulceration and inflammation.  I was concerned enough about the lesion that I sent her for CT Chest/Abd/pelvis and repeated her colonoscopy this week for more biopsies.  CTAP showed a  large polypoid lesion around the region of the ileocecal valve that was partially lipoma and partially polyp.  There was an incidental lung lesion that radiology thought could represent possibly a primary lung adenocarcinoma.  I repeated her colonoscopy 12/17/23 and was able to maneuver beyond the lesion into the cecum.  It does appear to be located in the proximal ascending colon/ileocecal valve region.  I did lots of additional biopsies focusing on the adenomatous appearing tissue.  The path again came back benign with ulceration and granulation tissue.  In reviewing the literature, there are reports of polyps that can develop around lipomas that look large and worrisome in turn out to be benign which I suspect is the case for her.  After reviewing the images, let me know if you think this is something that can be addressed endoscopically or if it is best that I refer her to colorectal surgery.  I will be sending a referral to thoracic oncology to address the lung lesion.  Best,  Haskell Linker

## 2023-12-22 NOTE — Telephone Encounter (Signed)
 MyChart message sent to patient.

## 2023-12-22 NOTE — Telephone Encounter (Signed)
 Urgent referral, records, demographics, and insurance information faxed to [facility] at [phone].

## 2023-12-22 NOTE — Telephone Encounter (Signed)
 Pt is scheduled for 7/9 lung nodule spot with SG & is aware. Nothing further needed.

## 2023-12-22 NOTE — Telephone Encounter (Signed)
 ----- Message from Scarlette Currier McGreal sent at 12/22/2023  4:28 PM EDT ----- Regarding: RE: Endoscopic resection vs. surgery for colon polyp Please let her know to expect calls from: 1.  Pulmonology-they will coordinate biopsy of the pulmonary lesion -based upon the appearance of the lung lesion on CT scan Dr. Baldwin Levee thinks that this will be a form of lung cancer 2.  Thoracic oncology -they will review the scans and results of biopsies of the lesion 3.  Colorectal surgery -the polyp in her right colon is benign but very large and cannot be removed endoscopically so will need surgical attention  Sorry for the multitude of messages.  Dr. Brice Campi forwarded my message to Dr. Baldwin Levee in pulmonary medicine who was going to get the ball rolling with pulmonology evaluation and biopsy after I had already asked to schedule thoracic oncology eval.  The thoracic oncology consult should be scheduled for a date after she has been seen by pulmonary medicine and coordinating her biopsy.  Haskell Linker ----- Message ----- From: Marline Simons, RN Sent: 12/22/2023   4:17 PM EDT To: Truddie Furrow, MD Subject: RE: Endoscopic resection vs. surgery for col#  Dr. Yvone Herd, I placed a referral to [facility] Thoracic Oncology earlier today per request in a previous staff message. Do I need to let the patient know to expect a call from Pulmonology instead? Or does she need to expect a call from both? Thanks,  [employer] ----- Message ----- From: Truddie Furrow, MD Sent: 12/22/2023   3:25 PM EDT To: Denson Flake, MD; Normie Becton., MD; # Subject: RE: Endoscopic resection vs. surgery for col#  Thank you, Dr. Baldwin Levee -your help is greatly appreciated  Best,  Haskell Linker ----- Message ----- From: Normie Becton., MD Sent: 12/22/2023   1:07 PM EDT To: Denson Flake, MD; Truddie Furrow, MD Subject: RE: Endoscopic resection vs. surgery for col#  RSB, Thanks for your speedy reply and help with this  patient. Arva Bio NM ----- Message ----- From: Denson Flake, MD Sent: 12/22/2023   1:02 PM EDT To: Normie Becton., MD; # Subject: RE: Endoscopic resection vs. surgery for col#  Thanks everyone -  The pulmonary nodule would be amenable to navigational bronch. I will work on getting her seen in our office. Suspect based on appearance that this is a highly differentiated lung adenoCA, so likely a separate process.   Rob ----- Message ----- From: Normie Becton., MD Sent: 12/20/2023   9:13 AM EDT To: Denson Flake, MD; Truddie Furrow, MD Subject: RE: Endoscopic resection vs. surgery for col#  NM, I think that based on the size of this particular lesion and location and difficulty with evaluation of the base, with her underlying iron deficiency, I think that a colonic resection probably makes the most sense since it is also involving the ileocecal valve region.  I would recommend Dr. Terrea Ferrier. White/Dr. Hershell Lose to evaluate for potential partial colectomy.  If she was a nonsurgical candidate, we could see what may be possible.  For the lung lesion, I am putting Dr. Baldwin Levee on here, he is our lead interventional pulmonologist, so that he can look at the CT chest imaging, he may be able to help expedite sampling of that region if he feels that navigational biopsy could be helpful.  RSB, Can you look at this patient's CT chest, is this something that you think you would be able to reach or should a thoracic surgery referral  be placed? Thanks as always. GM ----- Message ----- From: Truddie Furrow, MD Sent: 12/19/2023   7:34 AM EDT To: Normie Becton., MD Subject: Endoscopic resection vs. surgery for colon p#  Hi Gabe -  I am touching base to get your input as to whether or not a large proximal ascending colon polyp I found on Ms. Maqueda can be endoscopically resected or best referred to surgery.  I did her first colonoscopy on 12/08/2023 for left-sided abdominal  pain, IDA and history of adenomatous colon polyp.  Her previous colonoscopy was done in 2018.  I found a large polypoid lesion and what I surmised was the proximal ascending colon/cecum although I could not identify cecal landmarks at that time. The  biopsies of the lesion showed only polypoid mucosa with ulceration and inflammation.  I was concerned enough about the lesion that I sent her for CT Chest/Abd/pelvis and repeated her colonoscopy this week for more biopsies.  CTAP showed a large polypoid lesion around the region of the ileocecal valve that was partially lipoma and partially polyp.  There was an incidental lung lesion that radiology thought could represent possibly a primary lung adenocarcinoma.  I repeated her colonoscopy 12/17/23 and was able to maneuver beyond the lesion into the cecum.  It does appear to be located in the proximal ascending colon/ileocecal valve region.  I did lots of additional biopsies focusing on the adenomatous appearing tissue.  The path again came back benign with ulceration and granulation tissue.  In reviewing the literature, there are reports of polyps that can develop around lipomas that look large and worrisome in turn out to be benign which I suspect is the case for her.  After reviewing the images, let me know if you think this is something that can be addressed endoscopically or if it is best that I refer her to colorectal surgery.  I will be sending a referral to thoracic oncology to address the lung lesion.  Best,  Haskell Linker

## 2023-12-22 NOTE — Telephone Encounter (Signed)
-----   Message from Truddie Furrow sent at 12/19/2023  9:29 AM EDT ----- Regarding: Thoracic oncology referral Please place a referral for Kathryn Diaz to be seen by thoracic oncology to follow-up of finding of a lung nodule identified on CT chest abdomen pelvis that was concerning for possible adenocarcinoma  Thanks,  Haskell Linker

## 2023-12-22 NOTE — Telephone Encounter (Signed)
 Need to get this patient a nodule slot w RB, SG, or TP. Thank you

## 2023-12-28 ENCOUNTER — Telehealth: Payer: Self-pay | Admitting: Pediatrics

## 2023-12-28 ENCOUNTER — Encounter (HOSPITAL_COMMUNITY): Payer: Self-pay

## 2023-12-28 ENCOUNTER — Emergency Department (HOSPITAL_COMMUNITY)

## 2023-12-28 ENCOUNTER — Other Ambulatory Visit: Payer: Self-pay

## 2023-12-28 ENCOUNTER — Emergency Department (HOSPITAL_COMMUNITY)
Admission: EM | Admit: 2023-12-28 | Discharge: 2023-12-28 | Disposition: A | Discharge status: Home / Self Care | Attending: Emergency Medicine | Admitting: Emergency Medicine

## 2023-12-28 DIAGNOSIS — R051 Acute cough: Secondary | ICD-10-CM | POA: Diagnosis not present

## 2023-12-28 DIAGNOSIS — R059 Cough, unspecified: Secondary | ICD-10-CM | POA: Diagnosis not present

## 2023-12-28 DIAGNOSIS — R058 Other specified cough: Secondary | ICD-10-CM | POA: Diagnosis not present

## 2023-12-28 DIAGNOSIS — E876 Hypokalemia: Secondary | ICD-10-CM | POA: Diagnosis not present

## 2023-12-28 DIAGNOSIS — R5383 Other fatigue: Secondary | ICD-10-CM | POA: Diagnosis not present

## 2023-12-28 DIAGNOSIS — R911 Solitary pulmonary nodule: Secondary | ICD-10-CM | POA: Diagnosis not present

## 2023-12-28 LAB — URINALYSIS, ROUTINE W REFLEX MICROSCOPIC
Bacteria, UA: NONE SEEN
Bilirubin Urine: NEGATIVE
Glucose, UA: NEGATIVE mg/dL
Ketones, ur: NEGATIVE mg/dL
Leukocytes,Ua: NEGATIVE
Nitrite: NEGATIVE
Protein, ur: NEGATIVE mg/dL
Specific Gravity, Urine: 1.01 (ref 1.005–1.030)
pH: 6 (ref 5.0–8.0)

## 2023-12-28 LAB — COMPREHENSIVE METABOLIC PANEL WITH GFR
ALT: 26 U/L (ref 0–44)
AST: 34 U/L (ref 15–41)
Albumin: 3.2 g/dL — ABNORMAL LOW (ref 3.5–5.0)
Alkaline Phosphatase: 38 U/L (ref 38–126)
Anion gap: 8 (ref 5–15)
BUN: 15 mg/dL (ref 8–23)
CO2: 24 mmol/L (ref 22–32)
Calcium: 9.1 mg/dL (ref 8.9–10.3)
Chloride: 106 mmol/L (ref 98–111)
Creatinine, Ser: 1.1 mg/dL — ABNORMAL HIGH (ref 0.44–1.00)
GFR, Estimated: 53 mL/min — ABNORMAL LOW (ref >60)
Glucose, Bld: 102 mg/dL — ABNORMAL HIGH (ref 70–99)
Potassium: 3.2 mmol/L — ABNORMAL LOW (ref 3.5–5.1)
Sodium: 138 mmol/L (ref 135–145)
Total Bilirubin: 0.6 mg/dL (ref 0.0–1.2)
Total Protein: 5.8 g/dL — ABNORMAL LOW (ref 6.5–8.1)

## 2023-12-28 LAB — CBC
HCT: 34.4 % — ABNORMAL LOW (ref 36.0–46.0)
Hemoglobin: 10.7 g/dL — ABNORMAL LOW (ref 12.0–15.0)
MCH: 25.8 pg — ABNORMAL LOW (ref 26.0–34.0)
MCHC: 31.1 g/dL (ref 30.0–36.0)
MCV: 82.9 fL (ref 80.0–100.0)
Platelets: 304 10*3/uL (ref 150–400)
RBC: 4.15 MIL/uL (ref 3.87–5.11)
RDW: 16.8 % — ABNORMAL HIGH (ref 11.5–15.5)
WBC: 4.6 10*3/uL (ref 4.0–10.5)
nRBC: 0 % (ref 0.0–0.2)

## 2023-12-28 LAB — CBG MONITORING, ED: Glucose-Capillary: 90 mg/dL (ref 70–99)

## 2023-12-28 MED ORDER — ALBUTEROL SULFATE HFA 108 (90 BASE) MCG/ACT IN AERS
2.0000 | INHALATION_SPRAY | Freq: Once | RESPIRATORY_TRACT | Status: AC
  Administered 2023-12-28: 2 via RESPIRATORY_TRACT
  Filled 2023-12-28: qty 6.7

## 2023-12-28 MED ORDER — LACTATED RINGERS IV BOLUS
1000.0000 mL | Freq: Once | INTRAVENOUS | Status: AC
  Administered 2023-12-28: 1000 mL via INTRAVENOUS

## 2023-12-28 MED ORDER — AEROCHAMBER PLUS FLO-VU MEDIUM MISC
1.0000 | Freq: Once | Status: AC
  Administered 2023-12-28: 1
  Filled 2023-12-28 (×2): qty 1

## 2023-12-28 MED ORDER — POTASSIUM CHLORIDE CRYS ER 20 MEQ PO TBCR: 20.0000 meq | EXTENDED_RELEASE_TABLET | Freq: Every day | ORAL | 0 refills | Status: DC

## 2023-12-28 MED ORDER — POTASSIUM CHLORIDE CRYS ER 20 MEQ PO TBCR
40.0000 meq | EXTENDED_RELEASE_TABLET | Freq: Once | ORAL | Status: AC
  Administered 2023-12-28: 40 meq via ORAL
  Filled 2023-12-28: qty 2

## 2023-12-28 NOTE — Telephone Encounter (Signed)
 Patient daughter is calling and stated that her mother is at the hospital right now in severe pain. She has been trying to reach [facility] in order to schedule an appointment but has been unsuccessful. Patient daughter is requesting that we try to get a hold of [facility] to get her mother scheduled. Patient is also requesting that it be done today. Patient daughter is requesting a call back at [phone]. Please advise.

## 2023-12-28 NOTE — ED Provider Notes (Addendum)
  EMERGENCY DEPARTMENT AT [facility] Provider Note   CSN: [id number] Arrival date & time: 12/28/23  1043     History  Chief Complaint  Patient presents with   Fatigue   Cough    Kathryn Diaz is a 73 y.o. female.  HPI Patient reports she just being progressively weaker.  She reports with minimal activity she gets very fatigued and has to rest.  She denies she is having any vomiting or diarrhea.  She reports she has had loss of appetite and is not eating as much anymore.  She denies pain with eating.  Patient reports she is developed a bit of a cough over the past couple weeks.  She reports that chest persists and is nonproductive.  No chest pain.  No significant shortness of breath.  No Lower extremity swelling or calf pain.    Home Medications Prior to Admission medications   Medication Sig Start Date End Date Taking? Authorizing Provider  potassium chloride  SA (KLOR-CON  M) 20 MEQ tablet Take 1 tablet (20 mEq total) by mouth daily. 12/28/23  Yes Wynetta Heckle, MD  acetaminophen  (TYLENOL ) 650 MG CR tablet Take 650 mg by mouth every 8 (eight) hours as needed for pain.    [provider]  allopurinol  (ZYLOPRIM ) 300 MG tablet Take 300 mg by mouth daily.    [provider]  Berberine Chloride (BERBERINE HCI) 500 MG CAPS Take 1 capsule by mouth daily after lunch.    [provider]  Blood Glucose Monitoring Suppl (ONE TOUCH ULTRA 2) w/Device KIT Check blood sugar 1 time daily 03/03/22   Emilie Harden, MD  carvedilol  (COREG ) 3.125 MG tablet Take 3.125 mg by mouth 2 (two) times daily with a meal.    [provider]  ferrous gluconate  (FERGON) 324 MG tablet Take 1 tablet (324 mg total) by mouth daily with breakfast. 11/09/23   May, [name] J, NP  glucose blood (ONETOUCH VERIO) test strip Use as instructed to check blood sugar 1X daily Patient not taking: Reported on 11/06/2023 02/28/22   Emilie Harden, MD  Lancets [facility]  ULTRASOFT) lancets Use as instructed to check blood sugar 1X daily Patient not taking: Reported on 11/06/2023 02/28/22   Emilie Harden, MD  levothyroxine  (SYNTHROID ) 100 MCG tablet Take 1 tablet by mouth once daily 10/22/23   Emilie Harden, MD  losartan  (COZAAR ) 50 MG tablet Take 50 mg by mouth daily.    [provider]  metFORMIN  (GLUCOPHAGE -XR) 500 MG 24 hr tablet Take 1 tablet (500 mg total) by mouth daily with breakfast. Patient taking differently: Take 500 mg by mouth daily after breakfast. 01/15/23   Emilie Harden, MD  Multiple Vitamin (MULTIVITAMIN) capsule Take 1 capsule by mouth daily.    [provider]  omeprazole  (PRILOSEC) 40 MG capsule Take 1 capsule (40 mg total) by mouth 2 (two) times daily for 14 days. 12/11/23 12/25/23  Edmonia Gottron, PA-C  OneTouch Delica Lancets 30G MISC Check blood sugar 1 time daly 11/08/22   Emilie Harden, MD  rosuvastatin (CRESTOR) 20 MG tablet Take 20 mg by mouth daily.    [provider]  triamterene -hydrochlorothiazide  (DYAZIDE ) 37.5-25 MG capsule Take 1 capsule by mouth daily.    [provider]      Allergies    Patient has no known allergies.    Review of Systems   Review of Systems  Physical Exam Updated Vital Signs BP 120/83 (BP Location: Left Arm)   Pulse 86  Temp 99 F (37.2 C) (Oral)   Resp 17   SpO2 99%  Physical Exam Constitutional:      Comments: Alert nontoxic clinically well in appearance.  No respiratory distress.  HENT:     Head: Normocephalic and atraumatic.     Mouth/Throat:     Pharynx: Oropharynx is clear.  Eyes:     Extraocular Movements: Extraocular movements intact.  Cardiovascular:     Rate and Rhythm: Normal rate and regular rhythm.  Pulmonary:     Comments: Cough.  No respiratory distress.  Initially rhonchi the left lower mid lobe that cleared with cough.  Lungs otherwise clear without a significant wheeze or crackle Abdominal:     General: There is no  distension.     Palpations: Abdomen is soft.     Tenderness: There is no abdominal tenderness. There is no guarding.  Musculoskeletal:        General: No swelling or tenderness. Normal range of motion.     Right lower leg: No edema.     Left lower leg: No edema.     Comments: Calves soft and nontender.  No peripheral edema.  Skin:    General: Skin is warm and dry.  Neurological:     General: No focal deficit present.     Mental Status: She is oriented to person, place, and time.     Motor: No weakness.     Coordination: Coordination normal.     Comments: Alert with clear mental status.  Speech normal.  Movements coordinated purposeful symmetric.  Psychiatric:        Mood and Affect: Mood normal.     ED Results / Procedures / Treatments   Labs (all labs ordered are listed, but only abnormal results are displayed) Labs Reviewed  COMPREHENSIVE METABOLIC PANEL WITH GFR - Abnormal; Notable for the following components:      Result Value   Potassium 3.2 (*)    Glucose, Bld 102 (*)    Creatinine, Ser 1.10 (*)    Total Protein 5.8 (*)    Albumin 3.2 (*)    GFR, Estimated 53 (*)    All other components within normal limits  CBC - Abnormal; Notable for the following components:   Hemoglobin 10.7 (*)    HCT 34.4 (*)    MCH 25.8 (*)    RDW 16.8 (*)    All other components within normal limits  URINALYSIS, ROUTINE W REFLEX MICROSCOPIC - Abnormal; Notable for the following components:   Hgb urine dipstick SMALL (*)    All other components within normal limits  CBG MONITORING, ED    EKG EKG Interpretation Date/Time:  Monday December 28 2023 11:13:40 EDT Ventricular Rate:  84 PR Interval:  187 QRS Duration:  84 QT Interval:  400 QTC Calculation: 473 R Axis:   -81  Text Interpretation: Sinus rhythm Sinus pause Left anterior fascicular block Low voltage, precordial leads Abnormal R-wave progression, early transition agree, no acute ischemic change, low voltage compared to previous  Confirmed by Wynetta Heckle [phone]) on 12/28/2023 12:06:50 PM  Radiology DG Chest 2 View Result Date: 12/28/2023 CLINICAL DATA:  Productive cough EXAM: CHEST - 2 VIEW COMPARISON:  None Available. FINDINGS: Normal cardiac silhouette. No effusion surgical clips in the RIGHT axilla. No acute osseous abnormality. IMPRESSION: No acute cardiopulmonary process. Electronically Signed   By: Deboraha Fallow M.D.   On: 12/28/2023 12:35    Procedures Procedures    Medications Ordered in ED Medications  lactated ringers   bolus 1,000 mL (has no administration in time range)  potassium chloride  SA (KLOR-CON  M) CR tablet 40 mEq (has no administration in time range)    ED Course/ Medical Decision Making/ A&P                                 Medical Decision Making Amount and/or Complexity of Data Reviewed Labs: ordered. Radiology: ordered.  Risk Prescription drug management.   Patient presents with progressive fatigue.  She also has a pulmonary nodule that is undergoing diagnostic evaluation for possible malignancy.  Patient is already had diagnostic evaluation for a colon malignancy which to date has been negative with 2 biopsies.  At this time we will pursue basic diagnostic evaluation for metabolic or infectious etiology.  Urinalysis negative.  Comprehensive metabolic panel potassium 3.2 glucose 102 GFR 53 white count 4.6 hemoglobin 10.7.  Chest x-ray interpreted by radiology no acute changes.  I have reviewed CT chest abdomen pelvis imaging from [phone].  Interpretation suggests a posterior right upper lobe subsolid irregular nodule suspicious for adenocarcinoma.  Recommend consultations and eventual PET and or biopsy.  Patient is scheduled to follow-up with pulmonology in July.  I will dispense a spacer and albuterol MDI for wheeze and cough.  At this time I suspect patient is intermittently getting some bronchial mucus production or plugging.  We had extensive discussion regarding this CT  finding and the plan for follow-up with pulmonology.  At this time this appears to be a stable finding.  Review of patient's lab work does not suggest immediate metabolic derangement.  She does have mild hypokalemia which will be replaced with a short course of potassium and an oral dose Emergency Department.  Also some potential dehydration with GFR 53 will rehydrate with a liter of fluids in the emergency department.  Return precautions for fever, worsening productive cough, chest pain or shortness of breath reviewed with the patient.        Final Clinical Impression(s) / ED Diagnoses Final diagnoses:  Other fatigue  Acute cough  Pulmonary nodule  Hypokalemia    Rx / DC Orders ED Discharge Orders          Ordered    potassium chloride  SA (KLOR-CON  M) 20 MEQ tablet  Daily        12/28/23 1251              Wynetta Heckle, MD 12/28/23 1255    Wynetta Heckle, MD 12/28/23 1419

## 2023-12-28 NOTE — Discharge Instructions (Signed)
 1.  Your potassium is mildly low.  You have been given some extra potassium to take for the next week. 2.  Try to stay well-hydrated.  Eat small meals is much as possible. 3.  The CT scan you had done previously shows an area of a pulmonary nodule that will require further evaluation to rule out any lung cancer.  Follow-up with your pulmonologist as scheduled.  At this time, your chest x-ray and lab work do not suggest a pneumonia.  Return if you get a fever, chest pain, shortness of breath or worsening symptoms.

## 2023-12-28 NOTE — ED Triage Notes (Signed)
 PT arrives via POV. PT reports fatigue with exertion over the past week. PT denies any associated symptoms. PT reports she has also had a productive cough since last Tuesday. Pt AxOx4.

## 2023-12-29 DIAGNOSIS — G4733 Obstructive sleep apnea (adult) (pediatric): Secondary | ICD-10-CM | POA: Diagnosis not present

## 2023-12-29 NOTE — Telephone Encounter (Signed)
 Called and spoke with patient. Patient states that she is doing much better today after getting fluids from ER. Patient was able to get scheduled with [facility] on 01/04/24. Patient had no concerns at the end of the call.

## 2024-01-05 ENCOUNTER — Ambulatory Visit: Payer: Self-pay | Admitting: Surgery

## 2024-01-05 ENCOUNTER — Encounter: Payer: Self-pay | Admitting: Surgery

## 2024-01-05 DIAGNOSIS — R739 Hyperglycemia, unspecified: Secondary | ICD-10-CM

## 2024-01-05 DIAGNOSIS — B9681 Helicobacter pylori [H. pylori] as the cause of diseases classified elsewhere: Secondary | ICD-10-CM | POA: Diagnosis not present

## 2024-01-05 DIAGNOSIS — D500 Iron deficiency anemia secondary to blood loss (chronic): Secondary | ICD-10-CM | POA: Diagnosis not present

## 2024-01-05 DIAGNOSIS — K449 Diaphragmatic hernia without obstruction or gangrene: Secondary | ICD-10-CM | POA: Diagnosis not present

## 2024-01-05 DIAGNOSIS — R7303 Prediabetes: Secondary | ICD-10-CM | POA: Diagnosis not present

## 2024-01-05 DIAGNOSIS — R0609 Other forms of dyspnea: Secondary | ICD-10-CM | POA: Diagnosis not present

## 2024-01-05 DIAGNOSIS — I739 Peripheral vascular disease, unspecified: Secondary | ICD-10-CM | POA: Insufficient documentation

## 2024-01-05 DIAGNOSIS — N760 Acute vaginitis: Secondary | ICD-10-CM | POA: Insufficient documentation

## 2024-01-05 DIAGNOSIS — K2960 Other gastritis without bleeding: Secondary | ICD-10-CM | POA: Diagnosis not present

## 2024-01-05 DIAGNOSIS — K219 Gastro-esophageal reflux disease without esophagitis: Secondary | ICD-10-CM | POA: Diagnosis not present

## 2024-01-05 DIAGNOSIS — R918 Other nonspecific abnormal finding of lung field: Secondary | ICD-10-CM | POA: Diagnosis not present

## 2024-01-05 DIAGNOSIS — M19079 Primary osteoarthritis, unspecified ankle and foot: Secondary | ICD-10-CM | POA: Insufficient documentation

## 2024-01-05 DIAGNOSIS — K9289 Other specified diseases of the digestive system: Secondary | ICD-10-CM | POA: Diagnosis not present

## 2024-01-05 DIAGNOSIS — D175 Benign lipomatous neoplasm of intra-abdominal organs: Secondary | ICD-10-CM | POA: Diagnosis not present

## 2024-01-05 DIAGNOSIS — E1151 Type 2 diabetes mellitus with diabetic peripheral angiopathy without gangrene: Secondary | ICD-10-CM | POA: Insufficient documentation

## 2024-01-05 DIAGNOSIS — N182 Chronic kidney disease, stage 2 (mild): Secondary | ICD-10-CM | POA: Diagnosis not present

## 2024-01-15 ENCOUNTER — Encounter: Payer: Self-pay | Admitting: Internal Medicine

## 2024-01-15 ENCOUNTER — Ambulatory Visit: Payer: Medicare Other | Admitting: Internal Medicine

## 2024-01-15 VITALS — BP 106/60 | HR 64 | Ht 65.0 in | Wt 199.2 lb

## 2024-01-15 DIAGNOSIS — Z7984 Long term (current) use of oral hypoglycemic drugs: Secondary | ICD-10-CM

## 2024-01-15 DIAGNOSIS — E119 Type 2 diabetes mellitus without complications: Secondary | ICD-10-CM | POA: Diagnosis not present

## 2024-01-15 DIAGNOSIS — E039 Hypothyroidism, unspecified: Secondary | ICD-10-CM

## 2024-01-15 LAB — POCT GLYCOSYLATED HEMOGLOBIN (HGB A1C): Hemoglobin A1C: 5.9 % — AB (ref 4.0–5.6)

## 2024-01-15 NOTE — Addendum Note (Signed)
 Addended by: Vernon Goodpasture on: 01/15/2024 04:25 PM   Modules accepted: Orders

## 2024-01-15 NOTE — Progress Notes (Signed)
 Patient ID: Kathryn Diaz, female   DOB: 1950-08-06, 73 y.o.   MRN: [id number]  HPI: Kathryn Diaz is a 73 y.o.-year-old female, returning for follow-up for diabetes type 2, dx in 1995, non-insulin-dependent, controlled, with complications (peripheral vascular disease, CKD) and also uncontrolled hypothyroidism. Pt. previously saw Dr. Washington Diaz, but last visit with me 6 months ago.  Interim history: No increased urination, blurry vision, nausea, chest pain.  Since last visit, she had TAH 09/09/2023.  She  then had rectal bleeding. She had 2 colonoscopies - has a mass (benign) -surgery coming up. During this investigation, she was found to have a lung nodule, pending further investigation.  She has an appointment with oncology at [facility] coming up.  Reviewed history: Reviewing her chart, she appears to have diabetes as an entry but patient tells me that she has a history of prediabetes.  DM2: Reviewed HbA1c: 07/17/2023: HbA1c 6.2% Lab Results  Component Value Date   HGBA1C 6.1 (A) 01/15/2023   HGBA1C 5.8 (A) 07/15/2022   HGBA1C 6.2 (A) 02/28/2022   HGBA1C 6.6 (H) 11/14/2021   Pt is on a regimen of: - Metformin  ER 500 mg 1x a day, with a meal  Pt is checking sugars once a day: - am: n/c >> 90-110 >> 99-111 >> 96 >> 90s-112 - 2h after b'fast: n/c >> 103 >> n/c - before lunch: n/c>> 67 >> 100, 110 >> n/c - 2h after lunch: n/c >> 124 >> n/c >> 87, 98 >> 112-129 - before dinner: n/c - 2h after dinner: n/c >> 99-125 >> 90-100 >> n/c - bedtime: n/c - nighttime: n/c  Glucometer: none >> One Touch Verio  - + CKD, last BUN/creatinine: Lab Results  Component Value Date   BUN 15 12/28/2023   BUN 27 (H) 09/10/2023   CREATININE 1.10 (H) 12/28/2023   CREATININE 1.20 (H) 12/14/2023   Lab Results  Component Value Date   MICRALBCREAT NOTE 07/17/2023   MICRALBCREAT 1.9 02/28/2022  She is on losartan  50 mg daily.  -+ hyperlipidemia; last set of lipids: 11/02/2023: 133/64/47/73 09/09/2022:  150/80/49/85 Lab Results  Component Value Date   CHOL 153 02/28/2022   HDL 55.10 02/28/2022   LDLCALC 87 02/28/2022   TRIG 53.0 02/28/2022   CHOLHDL 3 02/28/2022  Per review of Dr. Arlester Diaz note: 08/01/2021: total cholesterol 166, LDL 107 and HDL 42 She was on simvastatin 80 mg daily >> changed to Rosuvastatin 20 mg daily.  - last eye exam was in 04/2023: No DR reportedly.  - no numbness and tingling in her feet. Sees [facility], but last foot exam was 01/15/2023 here in clinic.  Uncontrolled hypothyroidism:  Pt is on levothyroxine  100 mcg 6/7 days (ave 85 mcg daily) - decreased 09/2023 by EP, taken: - in am - fasting - 1h from b'fast - no calcium  - no iron - no multivitamins - stopped PPIs 12/2021 (was taking it in am) - not on Biotin  Reviewed her TFTs: 11/03/2023: TSH 0.302 Lab Results  Component Value Date   TSH 2.96 01/14/2023   TSH 0.50 09/26/2022   TSH 0.03 (L) 07/15/2022   TSH 1.00 02/28/2022   TSH 26.13 (H) 11/14/2021   TSH 27.60 (H) 09/12/2021   TSH 0.300 (L) 01/16/2012   She has a history of benign thyroid  nodule biopsy in 1998.  She does have a history of OSA-on CPAP, HTN, breast cancer-1982, HAs. In 2023, she started a healthy eating class at her [employer]. She improved her diet by reducing carbs  and stopped sweet tea.   ROS: + see HPI  Past Medical History:  Diagnosis Date   Anemia    Arthritis    Cancer (HCC) 1982   Breast Cancer   Cervicalgia    Chronic gout    04-01-2023  pt stated last episode > 20 yrs ago   CKD (chronic kidney disease), stage III (HCC)    Cystocele with uterine prolapse    Headache(784.0)    History of adenomatous polyp of colon    GI-- dr Venice Diaz   History of right breast cancer 1982   Hyperlipidemia, mixed    Hypertension    Hypothyroidism    endocrinologist-- dr Kathryn Diaz;  hx benign nodule bx 1998   Mild obstructive sleep apnea    09-03-2023  per pt last used oral appliance since after 09/ 2024 had issue w/  appliance then issue w/ medicare had not got fixed yet   Mobitz type 1 second degree atrioventricular block 12/2011   cardiologist--- dr Kathryn Diaz;  dx 06/ 2013  w/ bradycardia;  nuclear stress test 01-23-2012  normal perfusion no ischemia, nuclear ef 66%;  echo 01-16-2012 ef 60-65%, G1DD, mild MR/ TR   Sleep apnea    Type 2 diabetes mellitus [facility])    endocrinologist--- dr Kathryn Diaz;   (09-03-2023 per pt check blood sugar 2 times wkly)   Urethral hypermobility    Past Surgical History:  Procedure Laterality Date   BLADDER SUSPENSION N/A 09/09/2023   Procedure: TRANSVAGINAL TAPE (TVT) PROCEDURE;  Surgeon: Kathryn Copp, MD;  Location: [facility];  Service: Urology;  Laterality: N/A;   BREAST RECONSTRUCTION Right 1983   COLONOSCOPY  12/2017   dr Aldean Hummingbird   CYSTO WITH HYDRODISTENSION  09/09/2023   Procedure: CYSTOSCOPY/;  Surgeon: Kathryn Copp, MD;  Location: [facility];  Service: Urology;;   CYSTOCELE REPAIR N/A 09/09/2023   Procedure: ANTERIOR REPAIR (CYSTOCELE);  Surgeon: Kathryn Copp, MD;  Location: [facility];  Service: Urology;  Laterality: N/A;   CYSTOSCOPY N/A 09/09/2023   Procedure: CYSTOSCOPY;  Surgeon: Kathryn Copp, MD;  Location: [facility];  Service: Urology;  Laterality: N/A;   HYSTEROSCOPY WITH D & C N/A 04/08/2023   Procedure: DILATATION AND CURETTAGE /HYSTEROSCOPY;  Surgeon: Kathryn Song, MD;  Location: [facility] [place];  Service: Gynecology;  Laterality: N/A;   LAPAROSCOPY  11/11/2011   Procedure: LAPAROSCOPY OPERATIVE;  Surgeon: Kathryn Exon, MD;  Location: [facility];  Service: Gynecology;  Laterality: N/A;   MASTECTOMY Right 1982   RIGHT BREAST    REPAIR TENDONS FOOT Right 1999   right ankle   RIGHT ANKLE REPAIR Right 1989   SALPINGOOPHORECTOMY  11/11/2011   Procedure: SALPINGO OOPHERECTOMY;  Surgeon: Kathryn Exon, MD;  Location: [facility];  Service: Gynecology;  Laterality: Bilateral;   SINUS  ENDO WITH FUSION Left 01/07/2021   Procedure: SINUS ENDOSCOPY WITH FUSION NAVIGATION;  Surgeon: Kathryn Caves, MD;  Location: [facility];  Service: ENT;  Laterality: Left;   SPHENOIDECTOMY Left 01/07/2021   Procedure: LEFT ENDOSCOPIC  SPHENOIDECTOMY WITH TISSUE REMOVAL;  Surgeon: Kathryn Caves, MD;  Location: [facility];  Service: ENT;  Laterality: Left;   TUBAL LIGATION  1974   VAGINAL HYSTERECTOMY N/A 09/09/2023   Procedure: HYSTERECTOMY VAGINAL;  Surgeon: Kathryn Copp, MD;  Location: [facility];  Service: Urology;  Laterality: N/A;   Social History   Socioeconomic History   Marital status: Divorced  Spouse name: Not on file   Number of children: 2   Years of education: Not on file   Highest education level: Not on file  Occupational History   Occupation: Scientist, 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, manufacturing systems of [employer]     Comment: retired  Tobacco Use   Smoking status: Never   Smokeless tobacco: Never  Vaping Use   Vaping status: Never Used  Substance and Sexual Activity   Alcohol  use: No   Drug use: Never   Sexual activity: Not on file  Other Topics Concern   Not on file  Social History Narrative   Not on file   Social Drivers of Health   Financial Resource Strain: Not on file  Food Insecurity: Not on file  Transportation Needs: Not on file  Physical Activity: Not on file  Stress: Not on file  Social Connections: Not on file  Intimate Partner Violence: Not on file   Current Outpatient Medications on File Prior to Visit  Medication Sig Dispense Refill   acetaminophen  (TYLENOL ) 650 MG CR tablet Take 650 mg by mouth every 8 (eight) hours as needed for pain.     allopurinol  (ZYLOPRIM ) 300 MG tablet Take 300 mg by mouth daily.     Berberine Chloride (BERBERINE HCI) 500 MG CAPS Take 1 capsule by mouth daily after lunch.     Blood Glucose Monitoring Suppl (ONE TOUCH ULTRA 2) w/Device KIT Check blood sugar 1 time daily 1 kit 0    carvedilol  (COREG ) 3.125 MG tablet Take 3.125 mg by mouth 2 (two) times daily with a meal.     ferrous gluconate  (FERGON) 324 MG tablet Take 1 tablet (324 mg total) by mouth daily with breakfast. 30 tablet 3   glucose blood (ONETOUCH VERIO) test strip Use as instructed to check blood sugar 1X daily (Patient not taking: Reported on 11/06/2023) 100 each 12   Lancets (ONETOUCH ULTRASOFT) lancets Use as instructed to check blood sugar 1X daily (Patient not taking: Reported on 11/06/2023) 100 each 12   levothyroxine  (SYNTHROID ) 100 MCG tablet Take 1 tablet by mouth once daily 90 tablet 1   losartan  (COZAAR ) 50 MG tablet Take 50 mg by mouth daily.     metFORMIN  (GLUCOPHAGE -XR) 500 MG 24 hr tablet Take 1 tablet (500 mg total) by mouth daily with breakfast. (Patient taking differently: Take 500 mg by mouth daily after breakfast.) 90 tablet 3   Multiple Vitamin (MULTIVITAMIN) capsule Take 1 capsule by mouth daily.     omeprazole  (PRILOSEC) 40 MG capsule Take 1 capsule (40 mg total) by mouth 2 (two) times daily for 14 days. 28 capsule 0   OneTouch Delica Lancets 30G MISC Check blood sugar 1 time daly 100 each 12   potassium chloride  SA (KLOR-CON  M) 20 MEQ tablet Take 1 tablet (20 mEq total) by mouth daily. 7 tablet 0   rosuvastatin (CRESTOR) 20 MG tablet Take 20 mg by mouth daily.     triamterene -hydrochlorothiazide  (DYAZIDE ) 37.5-25 MG capsule Take 1 capsule by mouth daily.     No current facility-administered medications on file prior to visit.   No Known Allergies Family History  Problem Relation Age of Onset   Clotting disorder Mother        blood clot   Thyroid  disease Sister    Lung cancer Sister    Heart Problems Brother    Kidney disease Brother    Other Brother        cerebral hemorrhage  Colon cancer Neg Hx    Colon polyps Neg Hx    Esophageal cancer Neg Hx    Stomach cancer Neg Hx    Ulcerative colitis Neg Hx    PE: BP 106/60   Pulse 64   Ht 5' 5 (1.651 m)   Wt 199 lb 3.2 oz  (90.4 kg)   SpO2 98%   BMI 33.15 kg/m  Wt Readings from Last 15 Encounters:  01/15/24 199 lb 3.2 oz (90.4 kg)  12/17/23 209 lb (94.8 kg)  12/08/23 209 lb (94.8 kg)  11/06/23 209 lb (94.8 kg)  10/15/23 203 lb (92.1 kg)  09/09/23 206 lb 9.6 oz (93.7 kg)  07/17/23 209 lb (94.8 kg)  06/09/23 211 lb 2 oz (95.8 kg)  04/08/23 203 lb 14.4 oz (92.5 kg)  02/13/23 204 lb 12.8 oz (92.9 kg)  01/15/23 203 lb (92.1 kg)  01/14/23 204 lb 6.4 oz (92.7 kg)  07/15/22 210 lb 6.4 oz (95.4 kg)  02/28/22 217 lb 3.2 oz (98.5 kg)  12/03/21 216 lb (98 kg)   Constitutional: overweight, in NAD Eyes: no exophthalmos ENT: no thyromegaly, no cervical lymphadenopathy Cardiovascular: RRR, No MRG Respiratory: CTA B Musculoskeletal: no deformities Skin: no rashes Neurological: no tremor with outstretched hands Diabetic Foot Exam - Simple   Simple Foot Form Diabetic Foot exam was performed with the following findings: Yes 01/15/2024  1:21 PM  Visual Inspection No deformities, no ulcerations, no other skin breakdown bilaterally: Yes Sensation Testing Intact to touch and monofilament testing bilaterally: Yes Pulse Check Posterior Tibialis and Dorsalis pulse intact bilaterally: Yes Comments    ASSESSMENT: Prediabetes with complications - PVD - CKD  2.  Uncontrolled hypothyroidism  PLAN:  1. Patient with history of prediabetes, on oral antidiabetic regimen with low-dose metformin , 500 mg daily, with good control.  At last visit, HbA1c was slightly higher, at 6.2%, but still excellent.  Sugars were well-controlled despite occasionally forgetting metformin .  I did advise her that this could be taken whenever she remembered during the day but I did not change her regimen otherwise. -At today's visit, sugars remain at goal.  She is inquiring about metformin  safety with CKD and we discussed that metformin  may even help with CKD outcomes.  We are having her on a low dose and her kidney function is not very low.  I  recommended to continue with this for now.  This was refilled by PCP. - I suggested to:  Patient Instructions  Please continue: - Metformin  ER 500 mg 1x a day  Continue Levothyroxine  100 mcg  6/7 days.  Take the thyroid  hormone every day, with water , at least 30 minutes before breakfast, separated by at least 4 hours from: - acid reflux medications - calcium  - iron - multivitamins  Please stop at the lab.  Please return for another visit in 6 months.  - we checked her HbA1c: 5.9% (lower) - advised to check sugars at different times of the day - 1x a day, rotating check times - advised for yearly eye exams >> she is UTD - return to clinic in 6 months  2.  Uncontrolled hypothyroidism - latest thyroid  labs reviewed with pt. >> slightly low 0.302 2 mo ago  - she continues on LT4 100 mcg, which PCP decreased only 6 out of 7 days (average daily dose of 85 mcg daily) - pt feels good on this dose. - we discussed about taking the thyroid  hormone every day, with water , >30 minutes before breakfast, separated by >  4 hours from acid reflux medications, calcium , iron, multivitamins. Pt. is taking it correctly. - will check thyroid  tests today: TSH and fT4 - If labs are abnormal, she will need to return for repeat TFTs in 1.5 months  Needs refills LT4 88 if TFTs are normal.  Orders Placed This Encounter  Procedures   TSH   T4, free   Emilie Harden, MD PhD [facility]

## 2024-01-15 NOTE — Patient Instructions (Addendum)
 Please continue: - Metformin  ER 500 mg 1x a day  Continue Levothyroxine  100 mcg  6/7 days.  Take the thyroid  hormone every day, with water , at least 30 minutes before breakfast, separated by at least 4 hours from: - acid reflux medications - calcium  - iron - multivitamins  Please stop at the lab.  Please return for another visit in 6 months.

## 2024-01-16 LAB — T4, FREE: Free T4: 1.5 ng/dL (ref 0.8–1.8)

## 2024-01-16 LAB — TSH: TSH: 0.11 m[IU]/L — ABNORMAL LOW (ref 0.40–4.50)

## 2024-01-18 ENCOUNTER — Ambulatory Visit: Payer: Self-pay | Admitting: Internal Medicine

## 2024-01-18 DIAGNOSIS — E039 Hypothyroidism, unspecified: Secondary | ICD-10-CM

## 2024-01-18 MED ORDER — LEVOTHYROXINE SODIUM 75 MCG PO TABS
75.0000 ug | ORAL_TABLET | Freq: Every day | ORAL | 3 refills | Status: DC
Start: 2024-01-18 — End: 2024-03-08

## 2024-01-18 NOTE — Addendum Note (Signed)
 Addended by: TRIXIE FILE on: 01/18/2024 12:08 PM   Modules accepted: Orders

## 2024-01-19 NOTE — Patient Instructions (Signed)
 SURGICAL WAITING ROOM VISITATION  Patients having surgery or a procedure may have no more than 2 support people in the waiting area - these visitors may rotate.    Children under the age of 70 must have an adult with them who is not the patient.  Visitors with respiratory illnesses are discouraged from visiting and should remain at home.  If the patient needs to stay at the hospital during part of their recovery, the visitor guidelines for inpatient rooms apply. Pre-op nurse will coordinate an appropriate time for 1 support person to accompany patient in pre-op.  This support person may not rotate.    Please refer to the [facility] website for the visitor guidelines for Inpatients (after your surgery is over and you are in a regular room).       Your procedure is scheduled on: 02/03/24   Report to [facility] Main Entrance    Report to admitting at 11:45 AM   Call this number if you have problems the morning of surgery [phone]   Do not eat food :After Midnight.   After Midnight you may have the following liquids until 11 AM DAY OF SURGERY  Water  Non-Citrus Juices (without pulp, NO RED-Apple, White grape, White cranberry) Black Coffee (NO MILK/CREAM OR CREAMERS, sugar ok)  Clear Tea (NO MILK/CREAM OR CREAMERS, sugar ok) regular and decaf                             Plain Jell-O (NO RED)                                           Fruit ices (not with fruit pulp, NO RED)                                     Popsicles (NO RED)                                                               Sports drinks like Gatorade (NO RED)              Drink 2 Ensure/G2 drinks AT 10:00 PM the night before surgery.        The day of surgery:  Drink ONE (1) Pre-Surgery G2 at 11 AM the morning of surgery. Drink in one sitting. Do not sip.  This drink was given to you during your hospital  pre-op appointment visit. Nothing else to drink after completing the  Pre-Surgery  G2.           If you have questions, please contact your surgeon's office.   FOLLOW BOWEL PREP AND ANY ADDITIONAL PRE OP INSTRUCTIONS YOU RECEIVED FROM YOUR SURGEON'S OFFICE!!!     Oral Hygiene is also important to reduce your risk of infection.                                    Remember - BRUSH YOUR TEETH THE MORNING OF SURGERY WITH YOUR  REGULAR TOOTHPASTE  DENTURES WILL BE REMOVED PRIOR TO SURGERY PLEASE DO NOT APPLY Poly grip OR ADHESIVES!!!   Stop all vitamins and herbal supplements 7 days before surgery.   Take these medicines the morning of surgery with A SIP OF WATER : allopurinol (zyloprim ), tylenol , Carvedilol , levothyroxine , rosuvastatin  DO NOT TAKE ANY ORAL DIABETIC MEDICATIONS DAY OF YOUR SURGERY  Bring CPAP mask and tubing day of surgery.                              You may not have any metal on your body including hair pins, jewelry, and body piercing             Do not wear make-up, lotions, powders, perfumes/cologne, or deodorant  Do not wear nail polish including gel and S&S, artificial/acrylic nails, or any other type of covering on natural nails including finger and toenails. If you have artificial nails, gel coating, etc. that needs to be removed by a nail salon please have this removed prior to surgery or surgery may need to be canceled/ delayed if the surgeon/ anesthesia feels like they are unable to be safely monitored.   Do not shave  48 hours prior to surgery.    Do not bring valuables to the hospital. [facility] IS NOT             RESPONSIBLE   FOR VALUABLES.   Contacts, glasses, dentures or bridgework may not be worn into surgery.   Bring small overnight bag day of surgery.   DO NOT BRING YOUR HOME MEDICATIONS TO THE HOSPITAL. PHARMACY WILL DISPENSE MEDICATIONS LISTED ON YOUR MEDICATION LIST TO YOU DURING YOUR ADMISSION IN THE HOSPITAL!    Patients discharged on the day of surgery will not be allowed to drive home.  Someone NEEDS to stay with you for the first 24  hours after anesthesia.   Special Instructions: Bring a copy of your healthcare power of attorney and living will documents the day of surgery if you haven't scanned them before.              Please read over the following fact sheets you were given: IF YOU HAVE QUESTIONS ABOUT YOUR PRE-OP INSTRUCTIONS PLEASE CALL [phone] Kathryn Diaz   If you received a COVID test during your pre-op visit  it is requested that you wear a mask when out in public, stay away from anyone that may not be feeling well and notify your surgeon if you develop symptoms. If you test positive for Covid or have been in contact with anyone that has tested positive in the last 10 days please notify you surgeon.    [place] - Preparing for Surgery Before surgery, you can play an important role.  Because skin is not sterile, your skin needs to be as free of germs as possible.  You can reduce the number of germs on your skin by washing with CHG (chlorahexidine gluconate) soap before surgery.  CHG is an antiseptic cleaner which kills germs and bonds with the skin to continue killing germs even after washing. Please DO NOT use if you have an allergy to CHG or antibacterial soaps.  If your skin becomes reddened/irritated stop using the CHG and inform your nurse when you arrive at Short Stay. Do not shave (including legs and underarms) for at least 48 hours prior to the first CHG shower.  You may shave your face/neck.  Please follow these instructions carefully:  1.  Shower with CHG Soap the night before surgery and the  morning of surgery.  2.  If you choose to wash your hair, wash your hair first as usual with your normal  shampoo.  3.  After you shampoo, rinse your hair and body thoroughly to remove the shampoo.                             4.  Use CHG as you would any other liquid soap.  You can apply chg directly to the skin and wash.  Gently with a scrungie or clean washcloth.  5.  Apply the CHG Soap to your body ONLY FROM THE  NECK DOWN.   Do   not use on face/ open                           Wound or open sores. Avoid contact with eyes, ears mouth and   genitals (private parts).                       Wash face,  Genitals (private parts) with your normal soap.             6.  Wash thoroughly, paying special attention to the area where your    surgery  will be performed.  7.  Thoroughly rinse your body with warm water  from the neck down.  8.  DO NOT shower/wash with your normal soap after using and rinsing off the CHG Soap.                9.  Pat yourself dry with a clean towel.            10.  Wear clean pajamas.            11.  Place clean sheets on your bed the night of your first shower and do not  sleep with pets. Day of Surgery : Do not apply any lotions/deodorants the morning of surgery.  Please wear clean clothes to the hospital/surgery center.  FAILURE TO FOLLOW THESE INSTRUCTIONS MAY RESULT IN THE CANCELLATION OF YOUR SURGERY   _Incentive Spirometer   An incentive spirometer is a tool that can help keep your lungs clear and active. This tool measures how well you are filling your lungs with each breath. Taking long deep breaths may help reverse or decrease the chance of developing breathing (pulmonary) problems (especially infection) following: A long period of time when you are unable to move or be active. BEFORE THE PROCEDURE  If the spirometer includes an indicator to show your best effort, your nurse or respiratory therapist will set it to a desired goal. If possible, sit up straight or lean slightly forward. Try not to slouch. Hold the incentive spirometer in an upright position. INSTRUCTIONS FOR USE  Sit on the edge of your bed if possible, or sit up as far as you can in bed or on a chair. Hold the incentive spirometer in an upright position. Breathe out normally. Place the mouthpiece in your mouth and seal your lips tightly around it. Breathe in slowly and as deeply as possible, raising the  piston or the ball toward the top of the column. Hold your breath for 3-5 seconds or for as long as possible. Allow the piston or ball to fall to the bottom of the column. Remove the mouthpiece from your  mouth and breathe out normally. Rest for a few seconds and repeat Steps 1 through 7 at least 10 times every 1-2 hours when you are awake. Take your time and take a few normal breaths between deep breaths. The spirometer may include an indicator to show your best effort. Use the indicator as a goal to work toward during each repetition. After each set of 10 deep breaths, practice coughing to be sure your lungs are clear. If you have an incision (the cut made at the time of surgery), support your incision when coughing by placing a pillow or rolled up towels firmly against it. Once you are able to get out of bed, walk around indoors and cough well. You may stop using the incentive spirometer when instructed by your caregiver.  RISKS AND COMPLICATIONS Take your time so you do not get dizzy or light-headed. If you are in pain, you may need to take or ask for pain medication before doing incentive spirometry. It is harder to take a deep breath if you are having pain. AFTER USE Rest and breathe slowly and easily. It can be helpful to keep track of a log of your progress. Your caregiver can provide you with a simple table to help with this. If you are using the spirometer at home, follow these instructions: SEEK MEDICAL CARE IF:  You are having difficultly using the spirometer. You have trouble using the spirometer as often as instructed. Your pain medication is not giving enough relief while using the spirometer. You develop fever of 100.5 F (38.1 C) or higher. SEEK IMMEDIATE MEDICAL CARE IF:  You cough up bloody sputum that had not been present before. You develop fever of 102 F (38.9 C) or greater. You develop worsening pain at or near the incision site. MAKE SURE YOU:  Understand these  instructions. Will watch your condition. Will get help right away if you are not doing well or get worse. Document Released: 11/24/2006 Document Revised: 10/06/2011 Document Reviewed: 01/25/2007   WHAT IS A BLOOD TRANSFUSION? Blood Transfusion Information  A transfusion is the replacement of blood or some of its parts. Blood is made up of multiple cells which provide different functions. Red blood cells carry oxygen and are used for blood loss replacement. White blood cells fight against infection. Platelets control bleeding. Plasma helps clot blood. Other blood products are available for specialized needs, such as hemophilia or other clotting disorders. BEFORE THE TRANSFUSION  Who gives blood for transfusions?  Healthy volunteers who are fully evaluated to make sure their blood is safe. This is blood bank blood. Transfusion therapy is the safest it has ever been in the practice of medicine. Before blood is taken from a donor, a complete history is taken to make sure that person has no history of diseases nor engages in risky social behavior (examples are intravenous drug use or sexual activity with multiple partners). The donor's travel history is screened to minimize risk of transmitting infections, such as malaria. The donated blood is tested for signs of infectious diseases, such as HIV and hepatitis. The blood is then tested to be sure it is compatible with you in order to minimize the chance of a transfusion reaction. If you or a relative donates blood, this is often done in anticipation of surgery and is not appropriate for emergency situations. It takes many days to process the donated blood. RISKS AND COMPLICATIONS Although transfusion therapy is very safe and saves many lives, the main dangers of transfusion include:  Getting an infectious disease. Developing a transfusion reaction. This is an allergic reaction to something in the blood you were given. Every precaution is taken to  prevent this. The decision to have a blood transfusion has been considered carefully by your caregiver before blood is given. Blood is not given unless the benefits outweigh the risks. AFTER THE TRANSFUSION Right after receiving a blood transfusion, you will usually feel much better and more energetic. This is especially true if your red blood cells have gotten low (anemic). The transfusion raises the level of the red blood cells which carry oxygen, and this usually causes an energy increase. The nurse administering the transfusion will monitor you carefully for complications. HOME CARE INSTRUCTIONS  No special instructions are needed after a transfusion. You may find your energy is better. Speak with your caregiver about any limitations on activity for underlying diseases you may have. SEEK MEDICAL CARE IF:  Your condition is not improving after your transfusion. You develop redness or irritation at the intravenous (IV) site. SEEK IMMEDIATE MEDICAL CARE IF:  Any of the following symptoms occur over the next 12 hours: Shaking chills. You have a temperature by mouth above 102 F (38.9 C), not controlled by medicine. Chest, back, or muscle pain. People around you feel you are not acting correctly or are confused. Shortness of breath or difficulty breathing. Dizziness and fainting. You get a rash or develop hives. You have a decrease in urine output. Your urine turns a dark color or changes to pink, red, or brown. Any of the following symptoms occur over the next 10 days: You have a temperature by mouth above 102 F (38.9 C), not controlled by medicine. Shortness of breath. Weakness after normal activity. The white part of the eye turns yellow (jaundice). You have a decrease in the amount of urine or are urinating less often. Your urine turns a dark color or changes to pink, red, or brown. Document Released: 07/11/2000 Document Revised: 10/06/2011 Document Reviewed: 02/28/2008 [facility]  Patient Information 2014 [place], [place].

## 2024-01-19 NOTE — Progress Notes (Signed)
 COVID Vaccine received:  []  No [x]  Yes Date of any COVID positive Test in last 90 days: no PCP - Toribio Shan MD Cardiologist - Dorn Lesches MD  Chest x-ray - 12/28/23 Epic EKG -  12/30/23 Epic Stress Test - 01/23/12 Epic ECHO - 01/16/12 EPic Cardiac Cath -   Bowel Prep - [x]  No  []   Yes ______  Pacemaker / ICD device [x]  No []  Yes   Spinal Cord Stimulator:[x]  No []  Yes       History of Sleep Apnea? []  No [x]  Yes   CPAP used?- [x]  No []  Yes    Does the patient monitor blood sugar?          []  No [x]  Yes  []  N/A  Patient has: []  NO Hx DM   [x]  Pre-DM                 []  DM1  []   DM2 Does patient have a [employer] or Dexacom? [x]  No []  Yes   Fasting Blood Sugar Ranges- 90's to 110 Checks Blood Sugar ___2__ times a week  GLP1 agonist / usual dose - no GLP1 instructions:  SGLT-2 inhibitors / usual dose - no SGLT-2 instructions:   Blood Thinner / Instructions:no Aspirin  Instructions:no  Comments:   Activity level: Patient is able to climb a flight of stairs without difficulty; [x]  No CP  [x]  No SOB   Patient can  perform ADLs without assistance.   Anesthesia review: DM, HTN, 2nd degree heart block, CKD, OSA  Patient denies shortness of breath, fever, cough and chest pain at PAT appointment.  Patient verbalized understanding and agreement to the Pre-Surgical Instructions that were given to them at this PAT appointment. Patient was also educated of the need to review these PAT instructions again prior to his/her surgery.I reviewed the appropriate phone numbers to call if they have any and questions or concerns.

## 2024-01-20 DIAGNOSIS — R911 Solitary pulmonary nodule: Secondary | ICD-10-CM | POA: Diagnosis not present

## 2024-01-20 DIAGNOSIS — R0609 Other forms of dyspnea: Secondary | ICD-10-CM | POA: Diagnosis not present

## 2024-01-20 DIAGNOSIS — Z79899 Other long term (current) drug therapy: Secondary | ICD-10-CM | POA: Diagnosis not present

## 2024-01-20 DIAGNOSIS — R918 Other nonspecific abnormal finding of lung field: Secondary | ICD-10-CM | POA: Diagnosis not present

## 2024-01-21 ENCOUNTER — Other Ambulatory Visit: Payer: Self-pay

## 2024-01-21 ENCOUNTER — Encounter (HOSPITAL_COMMUNITY): Payer: Self-pay

## 2024-01-21 ENCOUNTER — Encounter (HOSPITAL_COMMUNITY)
Admission: RE | Admit: 2024-01-21 | Discharge: 2024-01-21 | Disposition: A | Discharge status: Home / Self Care | Source: Ambulatory Visit | Attending: Surgery | Admitting: Surgery

## 2024-01-21 VITALS — BP 104/77 | HR 70 | Resp 16 | Ht 65.0 in | Wt 199.0 lb

## 2024-01-21 DIAGNOSIS — E119 Type 2 diabetes mellitus without complications: Secondary | ICD-10-CM | POA: Insufficient documentation

## 2024-01-21 DIAGNOSIS — Z01818 Encounter for other preprocedural examination: Secondary | ICD-10-CM

## 2024-01-21 DIAGNOSIS — Z01812 Encounter for preprocedural laboratory examination: Secondary | ICD-10-CM | POA: Diagnosis not present

## 2024-01-21 HISTORY — DX: Personal history of other diseases of the digestive system: Z87.19

## 2024-01-21 LAB — CBC
HCT: 33.5 % — ABNORMAL LOW (ref 36.0–46.0)
Hemoglobin: 10.2 g/dL — ABNORMAL LOW (ref 12.0–15.0)
MCH: 25.6 pg — ABNORMAL LOW (ref 26.0–34.0)
MCHC: 30.4 g/dL (ref 30.0–36.0)
MCV: 84.2 fL (ref 80.0–100.0)
Platelets: 248 10*3/uL (ref 150–400)
RBC: 3.98 MIL/uL (ref 3.87–5.11)
RDW: 17.9 % — ABNORMAL HIGH (ref 11.5–15.5)
WBC: 6 10*3/uL (ref 4.0–10.5)
nRBC: 0 % (ref 0.0–0.2)

## 2024-01-21 LAB — BASIC METABOLIC PANEL WITH GFR
Anion gap: 13 (ref 5–15)
BUN: 19 mg/dL (ref 8–23)
CO2: 23 mmol/L (ref 22–32)
Calcium: 10.2 mg/dL (ref 8.9–10.3)
Chloride: 105 mmol/L (ref 98–111)
Creatinine, Ser: 1.07 mg/dL — ABNORMAL HIGH (ref 0.44–1.00)
GFR, Estimated: 55 mL/min — ABNORMAL LOW (ref >60)
Glucose, Bld: 102 mg/dL — ABNORMAL HIGH (ref 70–99)
Potassium: 3.5 mmol/L (ref 3.5–5.1)
Sodium: 141 mmol/L (ref 135–145)

## 2024-01-21 LAB — TYPE AND SCREEN
ABO/RH(D): O POS
Antibody Screen: NEGATIVE

## 2024-01-21 LAB — GLUCOSE, CAPILLARY: Glucose-Capillary: 105 mg/dL — ABNORMAL HIGH (ref 70–99)

## 2024-01-22 LAB — TYPE AND SCREEN

## 2024-01-25 ENCOUNTER — Encounter (HOSPITAL_COMMUNITY): Payer: Self-pay

## 2024-01-25 ENCOUNTER — Encounter (HOSPITAL_COMMUNITY)

## 2024-01-25 NOTE — Progress Notes (Addendum)
 DISCUSSION: Kathryn Diaz is a 73 yo female who presents to PAT prior to COLECTOMY, PARTIAL, ROBOT-ASSISTED, LAPAROSCOPIC with Dr. Sheldon on 02/03/24. PMH of HTN, 2nd degree block Mobitz I, OSA (no CPAP use), R lung mass, prediabetes (A1c 5.9), hypothyroidism, CKD, hx of R breast cancer s/p mastectomy (1982), arthritis, anemia, obesity.  Patient has long standing chronic IDA and w/u has revealed a colon mass. Biopsies have been negative for malignancy however on last colonoscopy it appeared it was growing larger and was more ulcerated so recommendation has been to remove it surgically.   Incidentally patient also has a R upper lung mass. She was referred to [facility] cardiothoracic surgery for work up. PET scan on 01/20/24 showed indeterminate 2.1cm nodule in RUL. Repeat CT is scheduled for 8/27.  Patient follows with Cardiology for hx of HTN and 2nd degree Type 1 block. Last seen in clinic on 02/13/23 for pre op clearance prior to a hysterectomy in 08/2023.  Patient previously underwent echocardiogram in 12/2011 that showed EF 60-65%, moderate LVH, no regional wall motion abnormalities, grade I DD, mild mitral valve regurgitation, mild tricuspid regurgitation. Nuclear stress test in 12/2011 showed no ischemia and was a normal, low risk study. She was advised to f/u in 1 year.  Patient had ED visit on 12/28/23 for worsening fatigue. W/u in the ED was largely unremarkable. She was given IVF and potassium supplementation and felt better so was discharged.   Seen by PCP on 11/09/23. She has chronic fatigue is felt to be due to her untreated OSA.  Patient follows with Endocrine for her prediabetes and hypothyroidism. Last seen on 01/15/24. A1c was 5.9. TSH was noted to be low and levothyroxine  dose was decreased.     VS: BP 104/77   Pulse 70   Resp 16   Ht 5' 5 (1.651 m)   Wt 90.3 kg   SpO2 100%   BMI 33.12 kg/m   PROVIDERS: Yolande Toribio MATSU, MD   LABS: Labs reviewed: Acceptable for surgery. Kidney  function stable. Anemia stable. T&S ordered. (all labs ordered are listed, but only abnormal results are displayed)  Labs Reviewed  BASIC METABOLIC PANEL WITH GFR - Abnormal; Notable for the following components:      Result Value   Glucose, Bld 102 (*)    Creatinine, Ser 1.07 (*)    GFR, Estimated 55 (*)    All other components within normal limits  CBC - Abnormal; Notable for the following components:   Hemoglobin 10.2 (*)    HCT 33.5 (*)    MCH 25.6 (*)    RDW 17.9 (*)    All other components within normal limits  GLUCOSE, CAPILLARY - Abnormal; Notable for the following components:   Glucose-Capillary 105 (*)    All other components within normal limits  TYPE AND SCREEN     IMAGES: PET CT scan 01/20/24:  IMPRESSION: 1.  Indeterminate 2.1 cm nodular opacity in the posterior aspect of the right upper lobe with associated mild hypermetabolic activity similar to blood-pool. This finding is indeterminate and could be infectious/inflammatory in etiology, though findings are suspicious for low grade primary lung malignancy. 2.  Ascending 3.3 cm colonic lipoma with circumferential mild hypermetabolic activity, possibly inflammatory in etiology. Consider surgical consultation if concern for intussusception. 3.  The ascending and proximal descending thoracic aorta are dilated, measuring up to 4.1 cm.  CXR 12/28/23:  FINDINGS: Normal cardiac silhouette. No effusion surgical clips in the RIGHT axilla. No acute osseous abnormality.  IMPRESSION: No acute cardiopulmonary process.    EKG 12/28/23:  Sinus rhythm, rate 62 Sinus pause Left anterior fascicular block Low voltage, precordial leads Abnormal R-wave progression, early transition agree, no acute ischemic change, low voltage compared to previous  CV:  Stress test 01/23/2012:  Impression: The post stress myocardial perfusion images show a normal pattern of perfusion in all regions. The post stress left ventricle is  normal in size. There is no scintigraphic evidence of inducible myocardial ischemia. The post stress EF is 66%. Global LV systolic function is normal. No significant wall motion abnormalities noted No ECG changes. EKG is negative for ischemia No prior study available for comparison. Normal Myocardial Perfusion study. No significant ischemia demonstrated. This is a low risk scan.  Echo 01/15/22:  Study Conclusions  - Left ventricle: The cavity size was normal. There was   moderate concentric hypertrophy. Systolic function was   normal. The estimated ejection fraction was in the range   of 60% to 65%. Wall motion was normal; there were no   regional wall motion abnormalities. Doppler parameters are   consistent with abnormal left ventricular relaxation   (grade 1 diastolic dysfunction). The E/e' ratio is >10,   suggesting elevated LV filling pressure. - Aortic valve: Mildly thickened leaflets. There was no   stenosis. No regurgitation. - Mitral valve: Mildly thickened leaflets . Late bileaflet   systolic prolapse. Mild central regurgitation. - Left atrium: The atrium was at the upper limits of normal   in size. - Right atrium: The atrium was mildly dilated. - Tricuspid valve: Mild regurgitation. - Inferior vena cava: The vessel was normal in size; the   respirophasic diameter changes were in the normal range (=   50%); findings are consistent with normal central venous   pressure. - Pericardium, extracardiac: There was no pericardial   effusion. Past Medical History:  Diagnosis Date   Anemia    Arthritis    Cancer (HCC) 1982   Breast Cancer   Cervicalgia    Chronic gout    04-01-2023  pt stated last episode > 20 yrs ago   CKD (chronic kidney disease), stage III (HCC)    Cystocele with uterine prolapse    Headache(784.0)    History of adenomatous polyp of colon    GI-- dr charlanne   History of hiatal hernia    History of right breast cancer 1982   Hyperlipidemia, mixed     Hypertension    Hypothyroidism    endocrinologist-- dr trixie;  hx benign nodule bx 1998   Mild obstructive sleep apnea    09-03-2023  per pt last used oral appliance since after 09/ 2024 had issue w/ appliance then issue w/ medicare had not got fixed yet   Mobitz type 1 second degree atrioventricular block 12/2011   cardiologist--- dr court;  dx 06/ 2013  w/ bradycardia;  nuclear stress test 01-23-2012  normal perfusion no ischemia, nuclear ef 66%;  echo 01-16-2012 ef 60-65%, G1DD, mild MR/ TR   Sleep apnea    Type 2 diabetes mellitus [facility])    endocrinologist--- dr trixie;   (09-03-2023 per pt check blood sugar 2 times wkly)   Urethral hypermobility     Past Surgical History:  Procedure Laterality Date   BLADDER SUSPENSION N/A 09/09/2023   Procedure: TRANSVAGINAL TAPE (TVT) PROCEDURE;  Surgeon: Elisabeth Valli BIRCH, MD;  Location: [facility];  Service: Urology;  Laterality: N/A;   BREAST RECONSTRUCTION Right 1983   COLONOSCOPY  12/2017  dr kemper   CYSTO WITH HYDRODISTENSION  09/09/2023   Procedure: CYSTOSCOPY/;  Surgeon: Elisabeth Valli BIRCH, MD;  Location: [facility];  Service: Urology;;   CYSTOCELE REPAIR N/A 09/09/2023   Procedure: ANTERIOR REPAIR (CYSTOCELE);  Surgeon: Elisabeth Valli BIRCH, MD;  Location: [facility];  Service: Urology;  Laterality: N/A;   CYSTOSCOPY N/A 09/09/2023   Procedure: CYSTOSCOPY;  Surgeon: Elisabeth Valli BIRCH, MD;  Location: [facility];  Service: Urology;  Laterality: N/A;   HYSTEROSCOPY WITH D & C N/A 04/08/2023   Procedure: DILATATION AND CURETTAGE /HYSTEROSCOPY;  Surgeon: Sarrah Browning, MD;  Location: [facility] [place];  Service: Gynecology;  Laterality: N/A;   LAPAROSCOPY  11/11/2011   Procedure: LAPAROSCOPY OPERATIVE;  Surgeon: Peggye Gull, MD;  Location: [facility];  Service: Gynecology;  Laterality: N/A;   MASTECTOMY Right 1982   RIGHT BREAST    REPAIR TENDONS FOOT Right 1999   right  ankle   RIGHT ANKLE REPAIR Right 1989   SALPINGOOPHORECTOMY  11/11/2011   Procedure: SALPINGO OOPHERECTOMY;  Surgeon: Peggye Gull, MD;  Location: [facility];  Service: Gynecology;  Laterality: Bilateral;   SINUS ENDO WITH FUSION Left 01/07/2021   Procedure: SINUS ENDOSCOPY WITH FUSION NAVIGATION;  Surgeon: Karis Clunes, MD;  Location: [facility];  Service: ENT;  Laterality: Left;   SPHENOIDECTOMY Left 01/07/2021   Procedure: LEFT ENDOSCOPIC  SPHENOIDECTOMY WITH TISSUE REMOVAL;  Surgeon: Karis Clunes, MD;  Location: [facility];  Service: ENT;  Laterality: Left;   TUBAL LIGATION  1974   VAGINAL HYSTERECTOMY N/A 09/09/2023   Procedure: HYSTERECTOMY VAGINAL;  Surgeon: Elisabeth Valli BIRCH, MD;  Location: [facility];  Service: Urology;  Laterality: N/A;    MEDICATIONS:  acetaminophen  (TYLENOL ) 650 MG CR tablet   allopurinol  (ZYLOPRIM ) 300 MG tablet   Blood Glucose Monitoring Suppl (ONE TOUCH ULTRA 2) w/Device KIT   carvedilol  (COREG ) 3.125 MG tablet   glucose blood (ONETOUCH VERIO) test strip   Lancets (ONETOUCH ULTRASOFT) lancets   levothyroxine  (SYNTHROID ) 75 MCG tablet   losartan  (COZAAR ) 50 MG tablet   metFORMIN  (GLUCOPHAGE ) 500 MG tablet   OneTouch Delica Lancets 30G MISC   rosuvastatin (CRESTOR) 20 MG tablet   triamterene -hydrochlorothiazide  (MAXZIDE -25) 37.5-25 MG tablet   No current facility-administered medications for this encounter.   Burnard CHRISTELLA Odis DEVONNA [facility] Surgical Short Stay/Anesthesiology [facility] Phone [phone] 01/25/2024 12:03 PM

## 2024-01-25 NOTE — Anesthesia Preprocedure Evaluation (Addendum)
 Anesthesia Evaluation  Patient identified by MRN, date of birth, ID band Patient awake    Reviewed: Allergy & Precautions, NPO status , Patient's Chart, lab work & pertinent test results  Airway Mallampati: II  TM Distance: >3 FB Neck ROM: Full    Dental no notable dental hx.    Pulmonary sleep apnea    Pulmonary exam normal        Cardiovascular hypertension, Pt. on home beta blockers and Pt. on medications + Peripheral Vascular Disease  Normal cardiovascular exam     Neuro/Psych  Headaches  negative psych ROS   GI/Hepatic Neg liver ROS, hiatal hernia, Bowel prep,,,  Endo/Other  diabetes, Oral Hypoglycemic AgentsHypothyroidism    Renal/GU CRFRenal disease     Musculoskeletal  (+) Arthritis ,    Abdominal  (+) + obese  Peds  Hematology  (+) Blood dyscrasia, anemia   Anesthesia Other Findings ASCENDING COLON MASS WITH BLEEDING AND ANEMIA  Reproductive/Obstetrics                              Anesthesia Physical Anesthesia Plan  ASA: 3  Anesthesia Plan: General   Post-op Pain Management:    Induction: Intravenous  PONV Risk Score and Plan: 3 and Ondansetron , Dexamethasone , Propofol  infusion and Treatment may vary due to age or medical condition  Airway Management Planned: Oral ETT  Additional Equipment:   Intra-op Plan:   Post-operative Plan: Extubation in OR  Informed Consent: I have reviewed the patients History and Physical, chart, labs and discussed the procedure including the risks, benefits and alternatives for the proposed anesthesia with the patient or authorized representative who has indicated his/her understanding and acceptance.     Dental advisory given  Plan Discussed with: CRNA  Anesthesia Plan Comments: (PAT note from 6/26)         Anesthesia Quick Evaluation

## 2024-02-03 ENCOUNTER — Inpatient Hospital Stay (HOSPITAL_COMMUNITY)
Admission: RE | Admit: 2024-02-03 | Discharge: 2024-02-05 | DRG: 330 | Disposition: A | Discharge status: Home / Self Care | Attending: Surgery | Admitting: Surgery

## 2024-02-03 ENCOUNTER — Encounter (HOSPITAL_COMMUNITY)
Admission: RE | Disposition: A | Discharge status: Home / Self Care | Payer: Self-pay | Source: Home / Self Care | Attending: Surgery

## 2024-02-03 ENCOUNTER — Other Ambulatory Visit: Payer: Self-pay

## 2024-02-03 ENCOUNTER — Inpatient Hospital Stay (HOSPITAL_COMMUNITY): Payer: Self-pay | Admitting: Medical

## 2024-02-03 ENCOUNTER — Inpatient Hospital Stay (HOSPITAL_COMMUNITY): Payer: Self-pay | Admitting: Physician Assistant

## 2024-02-03 ENCOUNTER — Ambulatory Visit: Admitting: Acute Care

## 2024-02-03 ENCOUNTER — Encounter (HOSPITAL_COMMUNITY): Payer: Self-pay | Admitting: Surgery

## 2024-02-03 DIAGNOSIS — G4733 Obstructive sleep apnea (adult) (pediatric): Secondary | ICD-10-CM | POA: Diagnosis present

## 2024-02-03 DIAGNOSIS — K561 Intussusception: Principal | ICD-10-CM | POA: Diagnosis present

## 2024-02-03 DIAGNOSIS — Z79890 Hormone replacement therapy (postmenopausal): Secondary | ICD-10-CM | POA: Diagnosis not present

## 2024-02-03 DIAGNOSIS — K635 Polyp of colon: Secondary | ICD-10-CM | POA: Diagnosis present

## 2024-02-03 DIAGNOSIS — Z8349 Family history of other endocrine, nutritional and metabolic diseases: Secondary | ICD-10-CM

## 2024-02-03 DIAGNOSIS — K6389 Other specified diseases of intestine: Secondary | ICD-10-CM | POA: Diagnosis not present

## 2024-02-03 DIAGNOSIS — D500 Iron deficiency anemia secondary to blood loss (chronic): Secondary | ICD-10-CM | POA: Diagnosis present

## 2024-02-03 DIAGNOSIS — Z79899 Other long term (current) drug therapy: Secondary | ICD-10-CM

## 2024-02-03 DIAGNOSIS — M1A9XX0 Chronic gout, unspecified, without tophus (tophi): Secondary | ICD-10-CM | POA: Diagnosis present

## 2024-02-03 DIAGNOSIS — K51411 Inflammatory polyps of colon with rectal bleeding: Secondary | ICD-10-CM | POA: Diagnosis not present

## 2024-02-03 DIAGNOSIS — K388 Other specified diseases of appendix: Secondary | ICD-10-CM | POA: Diagnosis not present

## 2024-02-03 DIAGNOSIS — K660 Peritoneal adhesions (postprocedural) (postinfection): Secondary | ICD-10-CM

## 2024-02-03 DIAGNOSIS — Z8249 Family history of ischemic heart disease and other diseases of the circulatory system: Secondary | ICD-10-CM | POA: Diagnosis not present

## 2024-02-03 DIAGNOSIS — K219 Gastro-esophageal reflux disease without esophagitis: Secondary | ICD-10-CM | POA: Diagnosis present

## 2024-02-03 DIAGNOSIS — Z801 Family history of malignant neoplasm of trachea, bronchus and lung: Secondary | ICD-10-CM | POA: Diagnosis not present

## 2024-02-03 DIAGNOSIS — I129 Hypertensive chronic kidney disease with stage 1 through stage 4 chronic kidney disease, or unspecified chronic kidney disease: Secondary | ICD-10-CM

## 2024-02-03 DIAGNOSIS — E039 Hypothyroidism, unspecified: Secondary | ICD-10-CM | POA: Diagnosis present

## 2024-02-03 DIAGNOSIS — E1122 Type 2 diabetes mellitus with diabetic chronic kidney disease: Secondary | ICD-10-CM | POA: Diagnosis present

## 2024-02-03 DIAGNOSIS — Z853 Personal history of malignant neoplasm of breast: Secondary | ICD-10-CM

## 2024-02-03 DIAGNOSIS — E782 Mixed hyperlipidemia: Secondary | ICD-10-CM | POA: Diagnosis present

## 2024-02-03 DIAGNOSIS — K9289 Other specified diseases of the digestive system: Secondary | ICD-10-CM | POA: Diagnosis not present

## 2024-02-03 DIAGNOSIS — Z9011 Acquired absence of right breast and nipple: Secondary | ICD-10-CM

## 2024-02-03 DIAGNOSIS — Z860101 Personal history of adenomatous and serrated colon polyps: Secondary | ICD-10-CM

## 2024-02-03 DIAGNOSIS — R918 Other nonspecific abnormal finding of lung field: Secondary | ICD-10-CM | POA: Diagnosis present

## 2024-02-03 DIAGNOSIS — E119 Type 2 diabetes mellitus without complications: Secondary | ICD-10-CM | POA: Diagnosis not present

## 2024-02-03 DIAGNOSIS — Z832 Family history of diseases of the blood and blood-forming organs and certain disorders involving the immune mechanism: Secondary | ICD-10-CM

## 2024-02-03 DIAGNOSIS — K922 Gastrointestinal hemorrhage, unspecified: Secondary | ICD-10-CM | POA: Diagnosis present

## 2024-02-03 DIAGNOSIS — N183 Chronic kidney disease, stage 3 unspecified: Secondary | ICD-10-CM | POA: Diagnosis present

## 2024-02-03 DIAGNOSIS — E669 Obesity, unspecified: Secondary | ICD-10-CM | POA: Diagnosis present

## 2024-02-03 DIAGNOSIS — Z981 Arthrodesis status: Secondary | ICD-10-CM

## 2024-02-03 DIAGNOSIS — Z90722 Acquired absence of ovaries, bilateral: Secondary | ICD-10-CM

## 2024-02-03 DIAGNOSIS — Z8679 Personal history of other diseases of the circulatory system: Secondary | ICD-10-CM

## 2024-02-03 DIAGNOSIS — D175 Benign lipomatous neoplasm of intra-abdominal organs: Secondary | ICD-10-CM | POA: Diagnosis present

## 2024-02-03 DIAGNOSIS — I10 Essential (primary) hypertension: Secondary | ICD-10-CM | POA: Diagnosis not present

## 2024-02-03 DIAGNOSIS — N182 Chronic kidney disease, stage 2 (mild): Secondary | ICD-10-CM | POA: Diagnosis present

## 2024-02-03 DIAGNOSIS — Z7984 Long term (current) use of oral hypoglycemic drugs: Secondary | ICD-10-CM | POA: Diagnosis not present

## 2024-02-03 DIAGNOSIS — Z6836 Body mass index (BMI) 36.0-36.9, adult: Secondary | ICD-10-CM

## 2024-02-03 DIAGNOSIS — E1151 Type 2 diabetes mellitus with diabetic peripheral angiopathy without gangrene: Secondary | ICD-10-CM | POA: Diagnosis present

## 2024-02-03 DIAGNOSIS — E1165 Type 2 diabetes mellitus with hyperglycemia: Secondary | ICD-10-CM | POA: Diagnosis present

## 2024-02-03 DIAGNOSIS — R001 Bradycardia, unspecified: Secondary | ICD-10-CM | POA: Diagnosis present

## 2024-02-03 DIAGNOSIS — Z90710 Acquired absence of both cervix and uterus: Secondary | ICD-10-CM

## 2024-02-03 DIAGNOSIS — M19079 Primary osteoarthritis, unspecified ankle and foot: Secondary | ICD-10-CM | POA: Diagnosis present

## 2024-02-03 DIAGNOSIS — Z9889 Other specified postprocedural states: Secondary | ICD-10-CM

## 2024-02-03 DIAGNOSIS — Z803 Family history of malignant neoplasm of breast: Secondary | ICD-10-CM

## 2024-02-03 DIAGNOSIS — R739 Hyperglycemia, unspecified: Secondary | ICD-10-CM

## 2024-02-03 DIAGNOSIS — Z841 Family history of disorders of kidney and ureter: Secondary | ICD-10-CM

## 2024-02-03 DIAGNOSIS — Z9851 Tubal ligation status: Secondary | ICD-10-CM

## 2024-02-03 DIAGNOSIS — I441 Atrioventricular block, second degree: Secondary | ICD-10-CM | POA: Diagnosis present

## 2024-02-03 HISTORY — PX: ROBOTIC ASSISTED LAPAROSCOPIC LYSIS OF ADHESION: SHX6080

## 2024-02-03 LAB — GLUCOSE, CAPILLARY
Glucose-Capillary: 109 mg/dL — ABNORMAL HIGH (ref 70–99)
Glucose-Capillary: 142 mg/dL — ABNORMAL HIGH (ref 70–99)

## 2024-02-03 SURGERY — COLECTOMY, PARTIAL, ROBOT-ASSISTED, LAPAROSCOPIC
Anesthesia: General | Site: Abdomen

## 2024-02-03 MED ORDER — GLYCOPYRROLATE 0.2 MG/ML IJ SOLN
INTRAMUSCULAR | Status: DC | PRN
Start: 2024-02-03 — End: 2024-02-03
  Administered 2024-02-03: .1 mg via INTRAVENOUS

## 2024-02-03 MED ORDER — LIDOCAINE HCL (PF) 2 % IJ SOLN
INTRAMUSCULAR | Status: DC | PRN
  Administered 2024-02-03: 60 mg via INTRADERMAL

## 2024-02-03 MED ORDER — BUPIVACAINE LIPOSOME 1.3 % IJ SUSP
INTRAMUSCULAR | Status: AC
  Filled 2024-02-03: qty 20

## 2024-02-03 MED ORDER — ROSUVASTATIN CALCIUM 20 MG PO TABS
20.0000 mg | ORAL_TABLET | Freq: Every day | ORAL | Status: DC
  Administered 2024-02-04: 20 mg via ORAL
  Filled 2024-02-03: qty 1

## 2024-02-03 MED ORDER — MAGIC MOUTHWASH: 15.0000 mL | Freq: Four times a day (QID) | ORAL | Status: DC | PRN

## 2024-02-03 MED ORDER — ONDANSETRON HCL 4 MG PO TABS: 4.0000 mg | ORAL_TABLET | Freq: Four times a day (QID) | ORAL | Status: DC | PRN

## 2024-02-03 MED ORDER — ONDANSETRON HCL 4 MG/2ML IJ SOLN
4.0000 mg | Freq: Once | INTRAMUSCULAR | Status: DC | PRN
Start: 2024-02-03 — End: 2024-02-03

## 2024-02-03 MED ORDER — SODIUM CHLORIDE 0.9 % IV SOLN
2.0000 g | Freq: Two times a day (BID) | INTRAVENOUS | Status: AC
  Administered 2024-02-03: 2 g via INTRAVENOUS
  Filled 2024-02-03: qty 2

## 2024-02-03 MED ORDER — SODIUM CHLORIDE 0.9 % IR SOLN
Status: DC | PRN
Start: 2024-02-03 — End: 2024-02-03
  Administered 2024-02-03 (×2): 1000 mL

## 2024-02-03 MED ORDER — PROPOFOL 10 MG/ML IV BOLUS
INTRAVENOUS | Status: DC | PRN
  Administered 2024-02-03: 200 mg via INTRAVENOUS

## 2024-02-03 MED ORDER — BUPIVACAINE-EPINEPHRINE (PF) 0.25% -1:200000 IJ SOLN
INTRAMUSCULAR | Status: DC | PRN
  Administered 2024-02-03: 60 mL

## 2024-02-03 MED ORDER — BUPIVACAINE LIPOSOME 1.3 % IJ SUSP: 20.0000 mL | Freq: Once | INTRAMUSCULAR | Status: DC

## 2024-02-03 MED ORDER — ENOXAPARIN SODIUM 40 MG/0.4ML IJ SOSY
40.0000 mg | PREFILLED_SYRINGE | INTRAMUSCULAR | Status: DC
  Administered 2024-02-04: 40 mg via SUBCUTANEOUS
  Filled 2024-02-03: qty 0.4

## 2024-02-03 MED ORDER — METRONIDAZOLE 500 MG PO TABS: 1000.0000 mg | ORAL_TABLET | ORAL | Status: DC

## 2024-02-03 MED ORDER — HYDRALAZINE HCL 20 MG/ML IJ SOLN: 10.0000 mg | INTRAMUSCULAR | Status: DC | PRN

## 2024-02-03 MED ORDER — NEOMYCIN SULFATE 500 MG PO TABS: 1000.0000 mg | ORAL_TABLET | ORAL | Status: DC

## 2024-02-03 MED ORDER — PROPOFOL 10 MG/ML IV BOLUS
INTRAVENOUS | Status: AC
  Filled 2024-02-03: qty 20

## 2024-02-03 MED ORDER — TRAMADOL HCL 50 MG PO TABS: 50.0000 mg | ORAL_TABLET | Freq: Four times a day (QID) | ORAL | 0 refills | Status: DC | PRN

## 2024-02-03 MED ORDER — LEVOTHYROXINE SODIUM 75 MCG PO TABS
75.0000 ug | ORAL_TABLET | Freq: Every day | ORAL | Status: DC
  Administered 2024-02-04 – 2024-02-05 (×2): 75 ug via ORAL
  Filled 2024-02-03 (×2): qty 1

## 2024-02-03 MED ORDER — MENTHOL 3 MG MT LOZG: 1.0000 | LOZENGE | OROMUCOSAL | Status: DC | PRN

## 2024-02-03 MED ORDER — GABAPENTIN 100 MG PO CAPS
200.0000 mg | ORAL_CAPSULE | Freq: Every day | ORAL | Status: DC
  Administered 2024-02-03 – 2024-02-04 (×2): 200 mg via ORAL
  Filled 2024-02-03 (×2): qty 2

## 2024-02-03 MED ORDER — ACETAMINOPHEN 500 MG PO TABS
1000.0000 mg | ORAL_TABLET | Freq: Four times a day (QID) | ORAL | Status: DC
Start: 2024-02-03 — End: 2024-02-05
  Administered 2024-02-04 – 2024-02-05 (×5): 1000 mg via ORAL
  Filled 2024-02-03 (×5): qty 2

## 2024-02-03 MED ORDER — LIDOCAINE HCL (PF) 2 % IJ SOLN
INTRAMUSCULAR | Status: AC
Start: 2024-02-03 — End: 2024-02-03
  Filled 2024-02-03: qty 5

## 2024-02-03 MED ORDER — METHOCARBAMOL 500 MG PO TABS: 1000.0000 mg | ORAL_TABLET | Freq: Four times a day (QID) | ORAL | Status: DC | PRN

## 2024-02-03 MED ORDER — POLYETHYLENE GLYCOL 3350 17 GM/SCOOP PO POWD: 238.0000 g | Freq: Once | ORAL | Status: DC

## 2024-02-03 MED ORDER — AMISULPRIDE (ANTIEMETIC) 5 MG/2ML IV SOLN
10.0000 mg | Freq: Once | INTRAVENOUS | Status: DC | PRN
Start: 2024-02-03 — End: 2024-02-03

## 2024-02-03 MED ORDER — SALINE SPRAY 0.65 % NA SOLN: 1.0000 | Freq: Four times a day (QID) | NASAL | Status: DC | PRN

## 2024-02-03 MED ORDER — ALLOPURINOL 300 MG PO TABS
300.0000 mg | ORAL_TABLET | Freq: Every day | ORAL | Status: DC
  Administered 2024-02-04: 300 mg via ORAL
  Filled 2024-02-03: qty 1

## 2024-02-03 MED ORDER — BISACODYL 5 MG PO TBEC: 20.0000 mg | DELAYED_RELEASE_TABLET | Freq: Once | ORAL | Status: DC

## 2024-02-03 MED ORDER — CARVEDILOL 3.125 MG PO TABS
3.1250 mg | ORAL_TABLET | Freq: Two times a day (BID) | ORAL | Status: DC
  Administered 2024-02-04 (×2): 3.125 mg via ORAL
  Filled 2024-02-03 (×2): qty 1

## 2024-02-03 MED ORDER — SODIUM CHLORIDE 0.9% FLUSH
3.0000 mL | Freq: Two times a day (BID) | INTRAVENOUS | Status: DC
  Administered 2024-02-03 – 2024-02-04 (×3): 3 mL via INTRAVENOUS

## 2024-02-03 MED ORDER — PSYLLIUM 95 % PO PACK
1.0000 | PACK | Freq: Two times a day (BID) | ORAL | Status: DC
  Administered 2024-02-03 – 2024-02-04 (×3): 1 via ORAL
  Filled 2024-02-03 (×3): qty 1

## 2024-02-03 MED ORDER — ACETAMINOPHEN 500 MG PO TABS
1000.0000 mg | ORAL_TABLET | ORAL | Status: AC
  Administered 2024-02-03: 1000 mg via ORAL
  Filled 2024-02-03: qty 2

## 2024-02-03 MED ORDER — SODIUM CHLORIDE 0.9 % IV SOLN: 250.0000 mL | INTRAVENOUS | Status: DC | PRN

## 2024-02-03 MED ORDER — DEXAMETHASONE SODIUM PHOSPHATE 10 MG/ML IJ SOLN
INTRAMUSCULAR | Status: DC | PRN
Start: 2024-02-03 — End: 2024-02-03
  Administered 2024-02-03: 4 mg via INTRAVENOUS

## 2024-02-03 MED ORDER — LACTATED RINGERS IV SOLN: INTRAVENOUS | Status: DC

## 2024-02-03 MED ORDER — SIMETHICONE 80 MG PO CHEW: 40.0000 mg | CHEWABLE_TABLET | Freq: Four times a day (QID) | ORAL | Status: DC | PRN

## 2024-02-03 MED ORDER — PHENYLEPHRINE 80 MCG/ML (10ML) SYRINGE FOR IV PUSH (FOR BLOOD PRESSURE SUPPORT)
PREFILLED_SYRINGE | INTRAVENOUS | Status: AC
Start: 2024-02-03 — End: 2024-02-03
  Filled 2024-02-03: qty 10

## 2024-02-03 MED ORDER — HYDROMORPHONE HCL 1 MG/ML IJ SOLN
0.5000 mg | INTRAMUSCULAR | Status: DC | PRN
  Administered 2024-02-03: 1 mg via INTRAVENOUS
  Filled 2024-02-03: qty 1

## 2024-02-03 MED ORDER — INSULIN ASPART 100 UNIT/ML IJ SOLN: 0.0000 [IU] | INTRAMUSCULAR | Status: DC | PRN

## 2024-02-03 MED ORDER — MELATONIN 3 MG PO TABS: 3.0000 mg | ORAL_TABLET | Freq: Every evening | ORAL | Status: DC | PRN

## 2024-02-03 MED ORDER — ROCURONIUM BROMIDE 10 MG/ML (PF) SYRINGE
PREFILLED_SYRINGE | INTRAVENOUS | Status: DC | PRN
  Administered 2024-02-03: 10 mg via INTRAVENOUS
  Administered 2024-02-03: 50 mg via INTRAVENOUS
  Administered 2024-02-03: 5 mg via INTRAVENOUS

## 2024-02-03 MED ORDER — SODIUM CHLORIDE 0.9 % IV SOLN
2.0000 g | INTRAVENOUS | Status: AC
  Administered 2024-02-03: 2 g via INTRAVENOUS
  Filled 2024-02-03: qty 2

## 2024-02-03 MED ORDER — DIPHENHYDRAMINE HCL 50 MG/ML IJ SOLN: 12.5000 mg | Freq: Four times a day (QID) | INTRAMUSCULAR | Status: DC | PRN

## 2024-02-03 MED ORDER — SUGAMMADEX SODIUM 200 MG/2ML IV SOLN
INTRAVENOUS | Status: DC | PRN
  Administered 2024-02-03: 200 mg via INTRAVENOUS

## 2024-02-03 MED ORDER — FENTANYL CITRATE (PF) 100 MCG/2ML IJ SOLN
INTRAMUSCULAR | Status: DC | PRN
  Administered 2024-02-03 (×2): 50 ug via INTRAVENOUS

## 2024-02-03 MED ORDER — ORAL CARE MOUTH RINSE: 15.0000 mL | Freq: Once | OROMUCOSAL | Status: AC

## 2024-02-03 MED ORDER — ENOXAPARIN SODIUM 40 MG/0.4ML IJ SOSY
40.0000 mg | PREFILLED_SYRINGE | Freq: Once | INTRAMUSCULAR | Status: AC
  Administered 2024-02-03: 40 mg via SUBCUTANEOUS
  Filled 2024-02-03: qty 0.4

## 2024-02-03 MED ORDER — METOPROLOL TARTRATE 5 MG/5ML IV SOLN: 5.0000 mg | Freq: Four times a day (QID) | INTRAVENOUS | Status: DC | PRN

## 2024-02-03 MED ORDER — TRIAMTERENE-HCTZ 37.5-25 MG PO TABS
1.0000 | ORAL_TABLET | Freq: Every morning | ORAL | Status: DC
  Filled 2024-02-03: qty 1

## 2024-02-03 MED ORDER — LACTATED RINGERS IV SOLN: Freq: Three times a day (TID) | INTRAVENOUS | Status: DC | PRN

## 2024-02-03 MED ORDER — PHENYLEPHRINE 80 MCG/ML (10ML) SYRINGE FOR IV PUSH (FOR BLOOD PRESSURE SUPPORT)
PREFILLED_SYRINGE | INTRAVENOUS | Status: DC | PRN
  Administered 2024-02-03 (×3): 80 ug via INTRAVENOUS

## 2024-02-03 MED ORDER — BUPIVACAINE-EPINEPHRINE (PF) 0.25% -1:200000 IJ SOLN
INTRAMUSCULAR | Status: AC
  Filled 2024-02-03: qty 60

## 2024-02-03 MED ORDER — ENSURE PRE-SURGERY PO LIQD: 296.0000 mL | Freq: Once | ORAL | Status: DC

## 2024-02-03 MED ORDER — FENTANYL CITRATE (PF) 100 MCG/2ML IJ SOLN
INTRAMUSCULAR | Status: AC
  Filled 2024-02-03: qty 2

## 2024-02-03 MED ORDER — NAPHAZOLINE-GLYCERIN 0.012-0.25 % OP SOLN: 1.0000 [drp] | Freq: Four times a day (QID) | OPHTHALMIC | Status: DC | PRN

## 2024-02-03 MED ORDER — FENTANYL CITRATE PF 50 MCG/ML IJ SOSY
PREFILLED_SYRINGE | INTRAMUSCULAR | Status: AC
  Filled 2024-02-03: qty 2

## 2024-02-03 MED ORDER — ENSURE SURGERY PO LIQD
237.0000 mL | Freq: Two times a day (BID) | ORAL | Status: DC
  Administered 2024-02-04: 237 mL via ORAL

## 2024-02-03 MED ORDER — ALVIMOPAN 12 MG PO CAPS
12.0000 mg | ORAL_CAPSULE | Freq: Two times a day (BID) | ORAL | Status: DC
  Administered 2024-02-04 (×2): 12 mg via ORAL
  Filled 2024-02-03 (×2): qty 1

## 2024-02-03 MED ORDER — FENTANYL CITRATE PF 50 MCG/ML IJ SOSY
25.0000 ug | PREFILLED_SYRINGE | INTRAMUSCULAR | Status: DC | PRN
  Administered 2024-02-03 (×2): 50 ug via INTRAVENOUS

## 2024-02-03 MED ORDER — STERILE WATER FOR IRRIGATION IR SOLN
Status: DC | PRN
  Administered 2024-02-03: 1000 mL

## 2024-02-03 MED ORDER — LACTATED RINGERS IR SOLN
Status: DC | PRN
  Administered 2024-02-03: 1000 mL

## 2024-02-03 MED ORDER — ONDANSETRON HCL 4 MG/2ML IJ SOLN: 4.0000 mg | Freq: Four times a day (QID) | INTRAMUSCULAR | Status: DC | PRN

## 2024-02-03 MED ORDER — GABAPENTIN 100 MG PO CAPS
200.0000 mg | ORAL_CAPSULE | ORAL | Status: AC
  Administered 2024-02-03: 200 mg via ORAL
  Filled 2024-02-03: qty 2

## 2024-02-03 MED ORDER — KCL IN DEXTROSE-NACL 20-5-0.45 MEQ/L-%-% IV SOLN
INTRAVENOUS | Status: DC
  Filled 2024-02-03 (×2): qty 1000

## 2024-02-03 MED ORDER — CHLORHEXIDINE GLUCONATE 0.12 % MT SOLN
15.0000 mL | Freq: Once | OROMUCOSAL | Status: AC
  Administered 2024-02-03: 15 mL via OROMUCOSAL

## 2024-02-03 MED ORDER — METHOCARBAMOL 1000 MG/10ML IJ SOLN: 1000.0000 mg | Freq: Four times a day (QID) | INTRAMUSCULAR | Status: DC | PRN

## 2024-02-03 MED ORDER — ROCURONIUM BROMIDE 10 MG/ML (PF) SYRINGE
PREFILLED_SYRINGE | INTRAVENOUS | Status: AC
Start: 2024-02-03 — End: 2024-02-03
  Filled 2024-02-03: qty 10

## 2024-02-03 MED ORDER — SODIUM CHLORIDE 0.9% FLUSH: 3.0000 mL | INTRAVENOUS | Status: DC | PRN

## 2024-02-03 MED ORDER — ENSURE PRE-SURGERY PO LIQD: 592.0000 mL | Freq: Once | ORAL | Status: DC

## 2024-02-03 MED ORDER — DIPHENHYDRAMINE HCL 12.5 MG/5ML PO ELIX: 12.5000 mg | ORAL_SOLUTION | Freq: Four times a day (QID) | ORAL | Status: DC | PRN

## 2024-02-03 MED ORDER — PROCHLORPERAZINE EDISYLATE 10 MG/2ML IJ SOLN: 5.0000 mg | Freq: Four times a day (QID) | INTRAMUSCULAR | Status: DC | PRN

## 2024-02-03 MED ORDER — ALUM & MAG HYDROXIDE-SIMETH 200-200-20 MG/5ML PO SUSP: 30.0000 mL | Freq: Four times a day (QID) | ORAL | Status: DC | PRN

## 2024-02-03 MED ORDER — ONDANSETRON HCL 4 MG/2ML IJ SOLN
INTRAMUSCULAR | Status: DC | PRN
  Administered 2024-02-03: 4 mg via INTRAVENOUS

## 2024-02-03 MED ORDER — LOSARTAN POTASSIUM 50 MG PO TABS
50.0000 mg | ORAL_TABLET | Freq: Every day | ORAL | Status: DC
  Filled 2024-02-03 (×2): qty 1

## 2024-02-03 MED ORDER — TRAMADOL HCL 50 MG PO TABS
50.0000 mg | ORAL_TABLET | Freq: Four times a day (QID) | ORAL | Status: DC | PRN
  Administered 2024-02-04 (×2): 100 mg via ORAL
  Filled 2024-02-03 (×2): qty 2

## 2024-02-03 MED ORDER — PHENOL 1.4 % MT LIQD: 2.0000 | OROMUCOSAL | Status: DC | PRN

## 2024-02-03 MED ORDER — PROCHLORPERAZINE MALEATE 10 MG PO TABS: 10.0000 mg | ORAL_TABLET | Freq: Four times a day (QID) | ORAL | Status: DC | PRN

## 2024-02-03 MED ORDER — ALVIMOPAN 12 MG PO CAPS
12.0000 mg | ORAL_CAPSULE | ORAL | Status: AC
  Administered 2024-02-03: 12 mg via ORAL
  Filled 2024-02-03: qty 1

## 2024-02-03 SURGICAL SUPPLY — 93 items
BAG COUNTER SPONGE SURGICOUNT (BAG) ×1 IMPLANT
BLADE EXTENDED COATED 6.5IN (ELECTRODE) IMPLANT
CANNULA REDUCER 12-8 DVNC XI (CANNULA) IMPLANT
CELLS DAT CNTRL 66122 CELL SVR (MISCELLANEOUS) IMPLANT
CHLORAPREP W/TINT 26 (MISCELLANEOUS) IMPLANT
CLIP APPLIE 5 13 M/L LIGAMAX5 (MISCELLANEOUS) IMPLANT
CLIP APPLIE ROT 10 11.4 M/L (STAPLE) IMPLANT
COVER SURGICAL LIGHT HANDLE (MISCELLANEOUS) ×2 IMPLANT
COVER TIP SHEARS 8 DVNC (MISCELLANEOUS) ×1 IMPLANT
DEFOGGER SCOPE WARM SEASHARP (MISCELLANEOUS) ×1 IMPLANT
DEVICE TROCAR PUNCTURE CLOSURE (ENDOMECHANICALS) IMPLANT
DRAIN CHANNEL 19F RND (DRAIN) IMPLANT
DRAPE ARM DVNC X/XI (DISPOSABLE) ×4 IMPLANT
DRAPE COLUMN DVNC XI (DISPOSABLE) ×1 IMPLANT
DRAPE CV SPLIT W-CLR ANES SCRN (DRAPES) ×1 IMPLANT
DRAPE PERI GROIN 82X75IN TIB (DRAPES) ×1 IMPLANT
DRAPE SURG IRRIG POUCH 19X23 (DRAPES) ×1 IMPLANT
DRIVER NDL LRG 8 DVNC XI (INSTRUMENTS) ×1 IMPLANT
DRIVER NDLE LRG 8 DVNC XI (INSTRUMENTS) IMPLANT
DRSG OPSITE POSTOP 4X10 (GAUZE/BANDAGES/DRESSINGS) IMPLANT
DRSG OPSITE POSTOP 4X6 (GAUZE/BANDAGES/DRESSINGS) IMPLANT
DRSG OPSITE POSTOP 4X8 (GAUZE/BANDAGES/DRESSINGS) IMPLANT
DRSG TEGADERM 2-3/8X2-3/4 SM (GAUZE/BANDAGES/DRESSINGS) ×5 IMPLANT
DRSG TEGADERM 4X4.75 (GAUZE/BANDAGES/DRESSINGS) IMPLANT
ELECT PENCIL ROCKER SW 15FT (MISCELLANEOUS) ×1 IMPLANT
ELECT REM PT RETURN 15FT ADLT (MISCELLANEOUS) ×1 IMPLANT
ENDOLOOP SUT PDS II 0 18 (SUTURE) IMPLANT
EVACUATOR SILICONE 100CC (DRAIN) IMPLANT
FORCEPS BPLR FENES DVNC XI (FORCEP) IMPLANT
GAUZE SPONGE 2X2 8PLY STRL LF (GAUZE/BANDAGES/DRESSINGS) ×1 IMPLANT
GLOVE BIO SURGEON STRL SZ 6.5 (GLOVE) IMPLANT
GLOVE ECLIPSE 8.0 STRL XLNG CF (GLOVE) ×3 IMPLANT
GLOVE INDICATOR 6.5 STRL GRN (GLOVE) IMPLANT
GLOVE INDICATOR 8.0 STRL GRN (GLOVE) ×3 IMPLANT
GOWN SRG XL LVL 4 BRTHBL STRL (GOWNS) ×1 IMPLANT
GOWN STRL REUS W/ TWL XL LVL3 (GOWN DISPOSABLE) ×4 IMPLANT
GRASPER SUT TROCAR 14GX15 (MISCELLANEOUS) IMPLANT
GRASPER TIP-UP FEN DVNC XI (INSTRUMENTS) ×1 IMPLANT
HOLDER FOLEY CATH W/STRAP (MISCELLANEOUS) ×1 IMPLANT
IRRIGATION SUCT STRKRFLW 2 WTP (MISCELLANEOUS) ×1 IMPLANT
KIT PROCEDURE DVNC SI (MISCELLANEOUS) IMPLANT
KIT SIGMOIDOSCOPE (SET/KITS/TRAYS/PACK) IMPLANT
KIT TURNOVER KIT A (KITS) ×1 IMPLANT
NDL INSUFFLATION 14GA 120MM (NEEDLE) ×1 IMPLANT
NDL SPNL 20GX3.5 QUINCKE YW (NEEDLE) ×1 IMPLANT
NEEDLE INSUFFLATION 14GA 120MM (NEEDLE) ×1 IMPLANT
NEEDLE SPNL 20GX3.5 QUINCKE YW (NEEDLE) ×1 IMPLANT
PACK COLON (CUSTOM PROCEDURE TRAY) ×1 IMPLANT
PAD POSITIONING PINK XL (MISCELLANEOUS) ×1 IMPLANT
PROTECTOR NERVE ULNAR (MISCELLANEOUS) ×2 IMPLANT
RELOAD STAPLE 45 3.5 BLU DVNC (STAPLE) IMPLANT
RELOAD STAPLE 45 4.3 GRN DVNC (STAPLE) IMPLANT
RELOAD STAPLE 60 2.5 WHT DVNC (STAPLE) IMPLANT
RELOAD STAPLE 60 3.5 BLU DVNC (STAPLE) IMPLANT
RELOAD STAPLE 60 4.3 GRN DVNC (STAPLE) IMPLANT
RELOAD STAPLER 2.5X60 WHT DVNC (STAPLE) ×1 IMPLANT
RELOAD STAPLER 3.5X45 BLU DVNC (STAPLE) IMPLANT
RELOAD STAPLER 3.5X60 BLU DVNC (STAPLE) ×3 IMPLANT
RELOAD STAPLER 4.3X45 GRN DVNC (STAPLE) IMPLANT
RELOAD STAPLER 4.3X60 GRN DVNC (STAPLE) IMPLANT
RETRACTOR WND ALEXIS 18 MED (MISCELLANEOUS) IMPLANT
SCISSORS LAP 5X35 DISP (ENDOMECHANICALS) ×1 IMPLANT
SCISSORS MNPLR CVD DVNC XI (INSTRUMENTS) ×1 IMPLANT
SEAL UNIV 5-12 XI (MISCELLANEOUS) ×4 IMPLANT
SEALER VESSEL EXT DVNC XI (MISCELLANEOUS) ×1 IMPLANT
SOLUTION ELECTROSURG ANTI STCK (MISCELLANEOUS) ×1 IMPLANT
SPIKE FLUID TRANSFER (MISCELLANEOUS) ×1 IMPLANT
STAPLER 45 SUREFORM DVNC (STAPLE) IMPLANT
STAPLER 60 SUREFORM DVNC (STAPLE) IMPLANT
STAPLER ECHELON POWER CIR 29 (STAPLE) IMPLANT
STAPLER ECHELON POWER CIR 31 (STAPLE) IMPLANT
STOPCOCK 4 WAY LG BORE MALE ST (IV SETS) ×2 IMPLANT
SURGILUBE 2OZ TUBE FLIPTOP (MISCELLANEOUS) IMPLANT
SUT MNCRL AB 4-0 PS2 18 (SUTURE) ×1 IMPLANT
SUT PDS AB 1 CT1 27 (SUTURE) ×2 IMPLANT
SUT PROLENE 0 CT 2 (SUTURE) IMPLANT
SUT PROLENE 2 0 KS (SUTURE) IMPLANT
SUT PROLENE 2 0 SH DA (SUTURE) IMPLANT
SUT SILK 2 0 SH CR/8 (SUTURE) IMPLANT
SUT SILK 3 0 SH CR/8 (SUTURE) ×1 IMPLANT
SUT VIC AB 2-0 SH 18 (SUTURE) ×1 IMPLANT
SUT VIC AB 3-0 SH 18 (SUTURE) IMPLANT
SUT VIC AB 3-0 SH 27XBRD (SUTURE) IMPLANT
SUT VICRYL 0 UR6 27IN ABS (SUTURE) IMPLANT
SUTURE V-LC BRB 180 2/0GR6GS22 (SUTURE) IMPLANT
SYR 20ML ECCENTRIC (SYRINGE) ×1 IMPLANT
SYSTEM LAPSCP GELPORT 120MM (MISCELLANEOUS) IMPLANT
SYSTEM WOUND ALEXIS 18CM MED (MISCELLANEOUS) ×1 IMPLANT
TAPE UMBILICAL 1/8 X36 TWILL (MISCELLANEOUS) ×1 IMPLANT
TRAY FOLEY MTR SLVR 16FR STAT (SET/KITS/TRAYS/PACK) ×1 IMPLANT
TROCAR ADV FIXATION 5X100MM (TROCAR) ×1 IMPLANT
TUBING CONNECTING 10 (TUBING) ×2 IMPLANT
TUBING INSUFFLATION 10FT LAP (TUBING) ×1 IMPLANT

## 2024-02-03 NOTE — Op Note (Signed)
 02/03/2024  2:18 PM  PATIENT:  Kathryn Diaz  73 y.o. female  Patient Care Team: Yolande Toribio MATSU, MD as PCP - General (Internal Medicine) Court Dorn PARAS, MD as Consulting Physician (Cardiology) Sheldon Standing, MD as Consulting Physician (General Surgery) McGreal, Inocente HERO, MD as Consulting Physician (Gastroenterology)  PRE-OPERATIVE DIAGNOSIS:  ASCENDING COLON MASS WITH BLEEDING AND ANEMIA  POST-OPERATIVE DIAGNOSIS:  ASCENDING COLON MASS WITH INTUSSUSCEPTION, BLEEDING, AND ANEMIA  PROCEDURE:   -ROBOTIC PROXIMAL RIGHT COLECTOMY WITH ANASTOMOSIS -LYSIS OF ADHESIONS x75 MINUTES (50% OF CASE) -TRANSVERSUS ABDOMINIS PLANE (TAP) BLOCK - BILATERAL  SURGEON:  Standing KYM Sheldon, MD  ASSISTANT:  Bernarda Ned, MD  An experienced assistant was required given the standard of surgical care given the complexity of the case.  This assistant was needed for exposure, dissection, suction, tissue approximation, retraction, perception, etc  ANESTHESIA:  General endotracheal intubation anesthesia (GETA) and Regional TRANSVERSUS ABDOMINIS PLANE (TAP) nerve block -BILATERAL for perioperative & postoperative pain control at the level of the transverse abdominis & preperitoneal spaces along the flank at the anterior axillary line, from subcostal ridge to iliac crest under laparoscopic guidance provided with 50 mL of bupivicaine 0.25% with epinephrine   Estimated Blood Loss (EBL):   Total I/O In: 100 [IV Piggyback:100] Out: - .   (See anesthesia record)  Delay start of Pharmacological VTE agent (>24hrs) due to concerns of significant anemia, surgical blood loss, or risk of bleeding?:  no  DRAINS: (None)  SPECIMEN:  Colon - proximal right  DISPOSITION OF SPECIMEN:  Pathology  COUNTS:  Sponge, needle, & instrument counts CORRECT at the conclusion of the case.      PLAN OF CARE: Admit to inpatient   PATIENT DISPOSITION:  PACU - hemodynamically stable.  INDICATION:    Patient found to have  motor deficiency anemia of bleeding.  Known ascending colon mass presumably benign lipoma.  However larger with resolution of bleeding.  I recommended segmental resection:  The anatomy & physiology of the digestive tract was discussed.  The pathophysiology was discussed.  Natural history risks without surgery was discussed.   I worked to give an overview of the disease and the frequent need to have multispecialty involvement.  I feel the risks of no intervention will lead to serious problems that outweigh the operative risks; therefore, I recommended a partial colectomy to remove the pathology.  Laparoscopic & open techniques were discussed.   Risks such as bleeding, infection, abscess, leak, reoperation, possible ostomy, hernia, heart attack, death, and other risks were discussed.  I noted a good likelihood this will help address the problem.   Goals of post-operative recovery were discussed as well.  We will work to minimize complications.  An educational handout on the pathology was given as well.  Questions were answered.    The patient expresses understanding & wishes to proceed with surgery.  OR FINDINGS:   Patient had large but soft ascending colon mass intussuscepting towards hepatic flexure consistent with lipoma. No obvious metastatic disease on visceral parietal peritoneum or liver.  It is an isoperistaltic ileocolonic anastomosis that rests in the region.  CASE DATA: Type of patient?: Elective [facility] Private Case Status of Case? Elective Scheduled Infection Present At Time Of Surgery (PATOS)?  NO     DESCRIPTION:   Informed consent was confirmed.  The patient underwent general anaesthesia without difficulty.  The patient was positioned with arms tucked & secured appropriately.  VTE prevention in place.  The patient's abdomen was clipped, prepped, & draped  in a sterile fashion.  Surgical timeout confirmed our plan.  The patient was positioned in reverse Trendelenburg.  Abdominal  entry was gained using Varess technique at the left subcostal ridge on the anterior abdominal wall.  No elevated EtCO2 noted.  Port placed.  Camera inspection revealed no injury.  Extra ports were carefully placed under direct laparoscopic visualization.  We docked the [employer] Vinci robot carefully and placed intstruments under visualization  I mobilized & reflected the greater omentum in the upper abdomen.  Position the small bowel into the left lower quadrant and pelvis.  Could see a bulky but soft mass in the mid ascending colon intussuscepting to the hepatic flexure.  Ischemia or perforation.  Tattooing near the hepatic flexure.  I was able to elevate the proximal colon to isolate the ileocolonic pedicle.  I scored the ileal mesentery just proximal to that.   I carried that further dissection in a medial to lateral fashion.  I was able to bluntly get into the retro-mesenteric plane on the right side.  I freed the proximal right sided colonic mesentery off the retroperitoneum including the duodenal sweep, pancreatic head, & Gerota's fascia of the right kidney.  I was able to get underneath the hepatic flexure.  I was able to get underneath the proximal and mid transverse colon.  I isolated the proximal ileocecal pedicle.  I skeletonized it & transected the vessels.    I then proceeded to mobilize the terminal ileum & proximal right colon in a lateral to medial fashion.  Came down to the pelvis and she had very dense small bowel adhesions to the entire pelvis limiting the ability of the ileum to reach into the upper abdomen.  We ended up having to redocked the robot a more towards the pelvis and to focus lyse adhesions to free the numerous loops of bowel that were densely adherent to the bladder dome and vaginal cuff.  Patient had minimal adhesions to the rectum providing an internal window.  There were some twisting and torsion but no full closed-loop obstruction.   Some adhesions to the retroperitoneum  especially gonadal vessels and right ureter.  We able to carefully isolate and freed those off. Eventually freed the bowel out of the pelvis.  I then freed off interloop adhesions.  I then ran the bowel from the ileocecal valve to ligament of Treitz to confirm no injury or other concern and viability.  I mobilized the ascending colon off It is side wall attachments to the paracolic gutter and retroperitoneum.  I also mobilized the greater omentum off the mid transverse colon and mobilized the mid to proximal transverse colon in a superior to inferior fashion.  This allowed me to mobilize the hepatic flexure and get a complete mobilization of the proximal right colon to the mid-transverse colon.   I could isolate the pathology.   Chose an area in the distal ileum and transected the mesentery radially chose an area in the transverse colon just proximal to a dominant middle colic artery pedicle and transected that in a radial fashion to good location.  We confirmed good viability of the ileum and transverse colon plan for anastomosis. When he went ahead and proceeded with transection.  We transected at the distal ileum with a robotic stapler 60mm blue load.  We then transected transverse colon with a robotic stapler 60mm blue load.  We confirmed hemostasis.     I did a side-to-side stapled anastomosis of ileum to mid-transverse colon using a 60mm  white load in an isoperistaltic fashion.  (Distal stump of ileum to mid transverse colon for the distal end of the anastomosis.  Proximal end of colon stump to more proximal ileum for the proximal end of the anastomosis).  I sewed the common staple channel wound with an absorbable suture (V-lock) in a running Canfield fashion from each corner and meeting in the center.  We did meticulous inspection prove an airtight closure.  I protected the anastomosis line with an anterior omentopexy of greater omentum using V lock suture.  Ran one to sutures to close the ileocolonic  mesenteric defect since it was somewhat oozy to have good closure and close mesentery to protect - but not include - the duodenal sweep carefully  We did reinspection of the abdomen.  Hemostasis was good.   Ureters, retroperitoneum, and bowel uninjured.  The anastomosis looked healthy.   Endoluminal gas was evacuated.  We placed the wound protector through the suprapubic 12mm port site after it was enlarged in a Pfannenstiel fashion.  Specimen removed without incident.   Ports & wound protector removed.  Hemostasis was good.  Sterile unused instruments were used from this point.  I closed the skin at the port sites using Monocryl stitch and sterile dressing.  I closed the extraction wound using a 0 Vicryl vertical peritoneal closure and a #1 PDS transverse anterior rectal fascial closure like a small Pfannenstiel closure. I closed the skin with some interrupted Monocryl stitches.  I placed sterile dressings.     Patient is being extubated go to recovery room. I discussed postop care with the patient in detail the office & in the holding area. Instructions are written. I made an attempt to reach the patient's daughter with no answer.  We will try again later.    Elspeth KYM Schultze, M.D., F.A.C.S. Gastrointestinal and Minimally Invasive Surgery [facility] [address], [address] [place], [place] [address] [phone] Main / Paging

## 2024-02-03 NOTE — Discharge Instructions (Signed)
 SURGERY: POST OP INSTRUCTIONS (Surgery for small bowel obstruction, colon resection, etc)   ######################################################################  EAT Gradually transition to a high fiber diet with a fiber supplement over the next few days after discharge  WALK Walk an hour a day.  Control your pain to do that.    CONTROL PAIN Control pain so that you can walk, sleep, tolerate sneezing/coughing, go up/down stairs.  HAVE A BOWEL MOVEMENT DAILY Keep your bowels regular to avoid problems.  OK to try a laxative to override constipation.  OK to use an antidairrheal to slow down diarrhea.  Call if not better after 2 tries  CALL IF YOU HAVE PROBLEMS/CONCERNS Call if you are still struggling despite following these instructions. Call if you have concerns not answered by these instructions  ######################################################################   DIET Follow a light diet the first few days at home.  Start with a bland diet such as soups, liquids, starchy foods, low fat foods, etc.  If you feel full, bloated, or constipated, stay on a ful liquid or pureed/blenderized diet for a few days until you feel better and no longer constipated. Be sure to drink plenty of fluids every day to avoid getting dehydrated (feeling dizzy, not urinating, etc.). Gradually add a fiber supplement to your diet over the next week.  Gradually get back to a regular solid diet.  Avoid fast food or heavy meals the first week as you are more likely to get nauseated. It is expected for your digestive tract to need a few months to get back to normal.  It is common for your bowel movements and stools to be irregular.  You will have occasional bloating and cramping that should eventually fade away.  Until you are eating solid food normally, off all pain medications, and back to regular activities; your bowels will not be normal. Focus on eating a low-fat, high fiber diet the rest of your life  (See Getting to Good Bowel Health, below).  CARE of your INCISION or WOUND  It is good for closed incisions and even open wounds to be washed every day.  Shower every day.  Short baths are fine.  Wash the incisions and wounds clean with soap & water .    You may leave closed incisions open to air if it is dry.   You may cover the incision with clean gauze & replace it after your daily shower for comfort.  TEGADERM:  You have clear gauze band-aid dressings over your closed incision(s).  Remove the dressings 2 days after surgery = 7/11.    If you have an open wound with a wound vac, see wound vac care instructions.    ACTIVITIES as tolerated Start light daily activities --- self-care, walking, climbing stairs-- beginning the day after surgery.  Gradually increase activities as tolerated.  Control your pain to be active.  Stop when you are tired.  Ideally, walk several times a day, eventually an hour a day.   Most people are back to most day-to-day activities in a few weeks.  It takes 4-8 weeks to get back to unrestricted, intense activity. If you can walk 30 minutes without difficulty, it is safe to try more intense activity such as jogging, treadmill, bicycling, low-impact aerobics, swimming, etc. Save the most intensive and strenuous activity for last (Usually 4-8 weeks after surgery) such as sit-ups, heavy lifting, contact sports, etc.  Refrain from any intense heavy lifting or straining until you are off narcotics for pain control.  You will have off  days, but things should improve week-by-week. DO NOT PUSH THROUGH PAIN.  Let pain be your guide: If it hurts to do something, don't do it.  Pain is your body warning you to avoid that activity for another week until the pain goes down. You may drive when you are no longer taking narcotic prescription pain medication, you can comfortably wear a seatbelt, and you can safely make sudden turns/stops to protect yourself without hesitating due to  pain. You may have sexual intercourse when it is comfortable. If it hurts to do something, stop.   MEDICATIONS Take your usually prescribed home medications unless otherwise directed.    Blood thinners:  You can restart any strong blood thinners after the second postoperative day  for example: COUMADIN (warfarin), XERELTO (rivaroxaban), ELIQUIS (apixaban), PLAVIX (clopidigrel), BRILINTA (ticagrelor), EFFIENT (prasugrel), PRADAXA (dabigatran), etc  Continue aspirin  before & after surgery..     Some oozing/bleeding the first 1-2 weeks is common but should taper down & be small volume.    If you are passing many large clots or having uncontrolling bleeding, call your surgeon    PAIN CONTROL Pain after surgery or related to activity is often due to strain/injury to muscle, tendon, nerves and/or incisions.  This pain is usually short-term and will improve in a few months.  To help speed the process of healing and to get back to regular activity more quickly, DO THE FOLLOWING THINGS TOGETHER: Increase activity gradually.  DO NOT PUSH THROUGH PAIN Use Ice and/or Heat Try Gentle Massage and/or Stretching Take over the counter pain medication Take Narcotic prescription pain medication for more severe pain  Good pain control = faster recovery.  It is better to take more medicine to be more active than to stay in bed all day to avoid medications.  Increase activity gradually Avoid heavy lifting at first, then increase to lifting as tolerated over the next 6 weeks. Do not "push through" the pain.  Listen to your body and avoid positions and maneuvers than reproduce the pain.  Wait a few days before trying something more intense Walking an hour a day is encouraged to help your body recover faster and more safely.  Start slowly and stop when getting sore.  If you can walk 30 minutes without stopping or pain, you can try more intense activity (running, jogging, aerobics, cycling, swimming,  treadmill, sex, sports, weightlifting, etc.) Remember: If it hurts to do it, then don't do it! Use Ice and/or Heat You will have swelling and bruising around the incisions.  This will take several weeks to resolve. Ice packs or heating pads (6-8 times a day, 30-60 minutes at a time) will help sooth soreness & bruising. Some people prefer to use ice alone, heat alone, or alternate between ice & heat.  Experiment and see what works best for you.  Consider trying ice for the first few days to help decrease swelling and bruising; then, switch to heat to help relax sore spots and speed recovery. Shower every day.  Short baths are fine.  It feels good!  Keep the incisions and wounds clean with soap & water .   Try Gentle Massage and/or Stretching Massage at the area of pain many times a day Stop if you feel pain - do not overdo it Take over the counter pain medication This helps the muscle and nerve tissues become less irritable and calm down faster Choose ONE of the following over-the-counter anti-inflammatory medications: Acetaminophen  500mg  tabs (Tylenol ) 1-2 pills with every meal  and just before bedtime (avoid if you have liver problems or if you have acetaminophen  in you narcotic prescription) Naproxen  220mg  tabs (ex. Aleve , Naprosyn ) 1-2 pills twice a day (avoid if you have kidney, stomach, IBD, or bleeding problems) Ibuprofen  200mg  tabs (ex. Advil , Motrin ) 3-4 pills with every meal and just before bedtime (avoid if you have kidney, stomach, IBD, or bleeding problems) Take with food/snack several times a day as directed for at least 2 weeks to help keep pain / soreness down & more manageable. Take Narcotic prescription pain medication for more severe pain A prescription for strong pain control is often given to you upon discharge (for example: oxycodone /Percocet, hydrocodone/Norco/Vicodin, or tramadol /Ultram ) Take your pain medication as prescribed. Be mindful that most narcotic prescriptions  contain Tylenol  (acetaminophen ) as well - avoid taking too much Tylenol . If you are having problems/concerns with the prescription medicine (does not control pain, nausea, vomiting, rash, itching, etc.), please call us  [phone] to see if we need to switch you to a different pain medicine that will work better for you and/or control your side effects better. If you need a refill on your pain medication, you must call the office before 4 pm and on weekdays only.  By federal law, prescriptions for narcotics cannot be called into a pharmacy.  They must be filled out on paper & picked up from our office by the patient or authorized caretaker.  Prescriptions cannot be filled after 4 pm nor on weekends.    WHEN TO CALL US  [phone] Severe uncontrolled or worsening pain  Fever over 101 F (38.5 C) Concerns with the incision: Worsening pain, redness, rash/hives, swelling, bleeding, or drainage Reactions / problems with new medications (itching, rash, hives, nausea, etc.) Nausea and/or vomiting Difficulty urinating Difficulty breathing Worsening fatigue, dizziness, lightheadedness, blurred vision Other concerns If you are not getting better after two weeks or are noticing you are getting worse, contact our office [phone] for further advice.  We may need to adjust your medications, re-evaluate you in the office, send you to the emergency room, or see what other things we can do to help. The clinic staff is available to answer your questions during regular business hours (8:30am-5pm).  Please don't hesitate to call and ask to speak to one of our nurses for clinical concerns.    A surgeon from [facility] is always on call at the hospitals 24 hours/day If you have a medical emergency, go to the nearest emergency room or call 911.  FOLLOW UP in our office One the day of your discharge from the hospital (or the next business weekday), please call [facility] to set up  or confirm an appointment to see your surgeon in the office for a follow-up appointment.  Usually it is 2-3 weeks after your surgery.   If you have skin staples at your incision(s), let the office know so we can set up a time in the office for the nurse to remove them (usually around 10 days after surgery). Make sure that you call for appointments the day of discharge (or the next business weekday) from the hospital to ensure a convenient appointment time. IF YOU HAVE DISABILITY OR FAMILY LEAVE FORMS, BRING THEM TO THE OFFICE FOR PROCESSING.  DO NOT GIVE THEM TO YOUR DOCTOR.  [facility] [address], [address], [place], [place]  [address] ? [phone] - Main [phone] - Toll Free,  [phone] - Fax [email or web address]  GETTING TO GOOD BOWEL HEALTH. It is expected for your digestive tract to need a few months to get back to normal.  It is common for your bowel movements and stools to be irregular.  You will have occasional bloating and cramping that should eventually fade away.  Until you are eating solid food normally, off all pain medications, and back to regular activities; your bowels will not be normal.   Avoiding constipation The goal: ONE SOFT BOWEL MOVEMENT A DAY!    Drink plenty of fluids.  Choose water  first. TAKE A FIBER SUPPLEMENT EVERY DAY THE REST OF YOUR LIFE During your first week back home, gradually add back a fiber supplement every day Experiment which form you can tolerate.   There are many forms such as powders, tablets, wafers, gummies, etc Psyllium bran (Metamucil), methylcellulose (Citrucel), Miralax  or Glycolax , Benefiber, Flax Seed.  Adjust the dose week-by-week (1/2 dose/day to 6 doses a day) until you are moving your bowels 1-2 times a day.  Cut back the dose or try a different fiber product if it is giving you problems such as diarrhea or bloating. Sometimes a laxative is needed to help jump-start bowels if  constipated until the fiber supplement can help regulate your bowels.  If you are tolerating eating & you are farting, it is okay to try a gentle laxative such as double dose MiraLax , prune juice, or Milk of Magnesia.  Avoid using laxatives too often. Stool softeners can sometimes help counteract the constipating effects of narcotic pain medicines.  It can also cause diarrhea, so avoid using for too long. If you are still constipated despite taking fiber daily, eating solids, and a few doses of laxatives, call our office. Controlling diarrhea Try drinking liquids and eating bland foods for a few days to avoid stressing your intestines further. Avoid dairy products (especially milk & ice cream) for a short time.  The intestines often can lose the ability to digest lactose when stressed. Avoid foods that cause gassiness or bloating.  Typical foods include beans and other legumes, cabbage, broccoli, and dairy foods.  Avoid greasy, spicy, fast foods.  Every person has some sensitivity to other foods, so listen to your body and avoid those foods that trigger problems for you. Probiotics (such as active yogurt, Align, etc) may help repopulate the intestines and colon with normal bacteria and calm down a sensitive digestive tract Adding a fiber supplement gradually can help thicken stools by absorbing excess fluid and retrain the intestines to act more normally.  Slowly increase the dose over a few weeks.  Too much fiber too soon can backfire and cause cramping & bloating. It is okay to try and slow down diarrhea with a few doses of antidiarrheal medicines.   Bismuth  subsalicylate (ex. Kayopectate, Pepto Bismol) for a few doses can help control diarrhea.  Avoid if pregnant.   Loperamide (Imodium) can slow down diarrhea.  Start with one tablet (2mg ) first.  Avoid if you are having fevers or severe pain.  ILEOSTOMY PATIENTS WILL HAVE CHRONIC DIARRHEA since their colon is not in use.    Drink plenty of liquids.   You will need to drink even more glasses of water /liquid a day to avoid getting dehydrated. Record output from your ileostomy.  Expect to empty the bag every 3-4 hours at first.  Most people with a permanent ileostomy empty their bag 4-6 times at the least.   Use antidiarrheal medicine (especially Imodium) several times a day to avoid getting dehydrated.  Start with a dose at bedtime & breakfast.  Adjust up or down as needed.  Increase antidiarrheal medications as directed to avoid emptying the bag more than 8 times a day (every 3 hours). Work with your wound ostomy nurse to learn care for your ostomy.  See ostomy care instructions. TROUBLESHOOTING IRREGULAR BOWELS 1) Start with a soft & bland diet. No spicy, greasy, or fried foods.  2) Avoid gluten/wheat or dairy products from diet to see if symptoms improve. 3) Miralax  17gm or flax seed mixed in 8oz. water  or juice-daily. May use 2-4 times a day as needed. 4) Gas-X, Phazyme, etc. as needed for gas & bloating.  5) Prilosec (omeprazole ) over-the-counter as needed 6)  Consider probiotics (Align, Activa, etc) to help calm the bowels down  Call your doctor if you are getting worse or not getting better.  Sometimes further testing (cultures, endoscopy, X-ray studies, CT scans, bloodwork, etc.) may be needed to help diagnose and treat the cause of the diarrhea. [facility] [address], [address], [place], [place]  [address] [phone] - Main.    [phone]  - Toll Free.   [phone] - Fax [email or web address]

## 2024-02-03 NOTE — Transfer of Care (Signed)
 Immediate Anesthesia Transfer of Care Note  Patient: Kathryn Diaz  Procedure(s) Performed: COLECTOMY, PARTIAL, ROBOT-ASSISTED, LAPAROSCOPIC (Abdomen) LYSIS, ADHESIONS, ROBOT-ASSISTED, LAPAROSCOPIC (Abdomen)  Patient Location: PACU  Anesthesia Type:General  Level of Consciousness: awake, alert , and oriented  Airway & Oxygen Therapy: Patient Spontanous Breathing and Patient connected to face mask oxygen  Post-op Assessment: Report given to RN and Post -op Vital signs reviewed and stable  Post vital signs: Reviewed and stable  Last Vitals:  Vitals Value Taken Time  BP 133/72 02/03/24 14:23  Temp    Pulse 94 02/03/24 14:28  Resp 11 02/03/24 14:28  SpO2 100 % 02/03/24 14:28  Vitals shown include unfiled device data.  Last Pain:  Vitals:   02/03/24 1020  TempSrc:   PainSc: 0-No pain         Complications: No notable events documented.

## 2024-02-03 NOTE — H&P (Signed)
02/03/2024    PATIENT NAME: Kathryn Diaz MRN: [id number] DOB: [birth year] PHYSICIANS:  REFERRING PHYSICIAN: McGreal, Inocente Jansky, MD  CARE TEAM:  Patient Care Team: Yolande Toribio Bare, MD as PCP - General (Internal Medicine) McGreal, Inocente Jansky, MD as Referring Physician (Gastroenterology) Sheldon, Elspeth Bitter, MD as Consulting Provider (General Surgery) Sarrah Rosaline LABOR, MD as Referring Physician (Obstetrics and Gynecology)  CONSULTING PROVIDER: ELSPETH BITTER SHELDON, MD  SUBJECTIVE   Chief Complaint: Colon Polyps   Kathryn Diaz is a 73 y.o. female  who is seen today as an office consultation  at the request of DrRONITA Hausen  for evaluation of anemia and ascending colon mass.  History of Present Illness:  73 year old woman. Comes today with her daughter. Has been struggling with anemia for some time. Underwent hysterectomy by Dr. Sarrah. Also had some uterine prolapse. That was in February 2023 without issues. She has had some persistent iron deficiency anemia. Has had upper and lower endoscopies. Followed by [facility] gastroenterology. Has had an ascending colon mass for some time presumed to be a lipoma. Usually not bothersome but on colonoscopy last month found to be larger and ulcerated. Concern for malignancy but biopsies are negative. CAT scan notes 7 x 5 x 4 cm mass. Patient also was found to have H. pylori gastritis. He has been treated with appropriate triple therapy and completed that weeks ago. Still feeling tired and anemic.  Patient claims she moves her bowels every day or so. Aside from hysterectomy and tubal ligation in the 1970s, no other abdominal surgery. She gets tired after walking a block or 2 but no exertional chest pain. She is not on blood thinners. She has been told she has prediabetes. Hemoglobin A1c last year 6.1. Not any medications for that. No sleep apnea. She was found to have a right upper lobe lung mass as well suspicious for possible  malignancy. She is in the process of meeting up with pulmonary and perhaps thoracic surgery at [facility] to discuss treatments. She is more nervous about the colon mass and wants to do with that first before deciding on any lung treatments.  Medical History:  Past Medical History:  Diagnosis Date  Anemia  Chronic kidney disease  History of cancer  Hyperlipidemia  Hypertension  Sleep apnea  Thyroid  disease   Patient Active Problem List  Diagnosis  Helicobacter pylori gastritis  Iron deficiency anemia due to chronic blood loss  Lipoma of colon  Chronic lower gastrointestinal bleeding  Pre-diabetes  Hiatal hernia with gastroesophageal reflux  Mass of upper lobe of right lung  DOE (dyspnea on exertion)  CKD (chronic kidney disease) stage 2, GFR 60-89 ml/min   Past Surgical History:  Procedure Laterality Date  HYSTERECTOMY  MASTECTOMY    No Known Allergies  Current Outpatient Medications on File Prior to Visit  Medication Sig Dispense Refill  acetaminophen  (TYLENOL ) 650 MG ER tablet Take 650 mg by mouth every 8 (eight) hours as needed  allopurinoL  (ZYLOPRIM ) 300 MG tablet Take 1 tablet by mouth once daily  BERBERINE CHLORIDE ORAL Take 1 capsule by mouth  carvediloL  (COREG ) 3.125 MG tablet Take 1 tablet by mouth 2 (two) times daily  ferrous gluconate  (FERGON) 324 MG tablet Take 324 mg by mouth daily with breakfast  levothyroxine  (SYNTHROID ) 100 MCG tablet Take 1 tablet by mouth once daily  losartan  (COZAAR ) 50 MG tablet Take 1 tablet by mouth once daily  metFORMIN  (GLUCOPHAGE ) 500 MG tablet Take 1 tablet by mouth daily with breakfast  omeprazole  (  PRILOSEC) 20 MG DR capsule  omeprazole  (PRILOSEC) 40 MG DR capsule Take 40 mg by mouth  rosuvastatin  (CRESTOR ) 20 MG tablet Take 20 mg by mouth once daily  triamterene -hydroCHLOROthiazide  (MAXZIDE -25) 37.5-25 mg tablet Take 1 tablet by mouth every morning   No current facility-administered medications on file prior to visit.   Family  History  Problem Relation Age of Onset  High blood pressure (Hypertension) Sister  Hyperlipidemia (Elevated cholesterol) Sister  Coronary Artery Disease (Blocked arteries around heart) Sister  Breast cancer Sister    Social History   Tobacco Use  Smoking Status Never  Smokeless Tobacco Never    Social History   Socioeconomic History  Marital status: Divorced  Tobacco Use  Smoking status: Never  Smokeless tobacco: Never  Substance and Sexual Activity  Alcohol  use: Never  Drug use: Never   Social Drivers of Health   Housing Stability: Unknown (01/05/2024)  Housing Stability Vital Sign  Homeless in the Last Year: No   ############################################################  Review of Systems: A complete review of systems (ROS) was obtained from the patient.  We have reviewed this information and discussed as appropriate with the patient.  See HPI as well for other pertinent ROS.  Constitutional: No fevers, chills, sweats. Weight stable Eyes: No vision changes, No discharge HENT: No sore throats, nasal drainage Lymph: No neck swelling, No bruising easily Pulmonary: No cough, productive sputum CV: No orthopnea, PND . No exertional chest/neck/shoulder/arm pain. Patient can walk 10 minutes gradually.   GI: No personal nor family history of GI/colon cancer, inflammatory bowel disease, irritable bowel syndrome, allergy such as Celiac Sprue, dietary/dairy problems, colitis, ulcers nor gastritis. No recent sick contacts/gastroenteritis. No travel outside the country. No changes in diet.  Renal: No UTIs, No hematuria Genital: No drainage, bleeding, masses Musculoskeletal: No severe joint pain. Good ROM major joints Skin: No sores or lesions Heme/Lymph: No easy bleeding. No swollen lymph nodes Neuro: No active seizures. No facial droop Psych: No hallucinations. No agitation  OBJECTIVE   Vitals:  01/05/24 1059  BP: 110/78  Pulse: 88  Weight: 90.3 kg (199 lb)   Height: 165.1 cm (5' 5)   Body mass index is 33.12 kg/m.  PHYSICAL EXAM:  Constitutional: Not cachectic. Hygeine adequate. Vitals signs as above.  Eyes: No glasses. Vision adequate,Pupils reactive, normal extraocular movements. Sclera nonicteric Neuro: CN II-XII intact. No major focal sensory defects. No major motor deficits. Lymph: No head/neck/groin lymphadenopathy Psych: No severe agitation. No severe anxiety. Judgment & insight Adequate, Oriented x4, HENT: Normocephalic, Mucus membranes moist. No thrush. Hearing: adequate Neck: Supple, No tracheal deviation. No obvious thyromegaly Chest: No pain to chest wall compression. Good respiratory excursion. No audible wheezing CV: Pulses intact. regular. No major extremity edema Ext: No obvious deformity or contracture. Edema: Not present. No cyanosis Skin: No major subcutaneous nodules. Warm and dry Musculoskeletal: Severe joint rigidity not present. No obvious clubbing. No digital petechiae. Mobility: no assist device moving easily without restrictions  Abdomen: Obese Soft. Nondistended. Nontender. Hernia: Not present. Diastasis recti: Not present. No hepatomegaly. No splenomegaly.  Genital/Pelvic: Inguinal hernia: Not present. Inguinal lymph nodes: without lymphadenopathy nor hidradenitis.   Rectal: (Deferred)    ###################################################################  Labs, Imaging and Diagnostic Testing:  Located in 'Care Everywhere' section of Epic EMR chart  PRIOR [facility] CLINIC NOTES:  Not applicable  SURGERY NOTES:  Not applicable  PATHOLOGY:  Located in 'Care Everywhere' section of Epic EMR chart  Assessment and Plan:  DIAGNOSES:  Diagnoses and all orders for this  visit:  Chronic lower gastrointestinal bleeding  Lipoma of colon  Iron deficiency anemia due to chronic blood loss  Helicobacter pylori gastritis - ondansetron  (ZOFRAN ) 4 MG tablet; Take 1 tablet (4 mg total) by mouth every 8  (eight) hours as needed for Nausea  Hiatal hernia with gastroesophageal reflux - ondansetron  (ZOFRAN ) 4 MG tablet; Take 1 tablet (4 mg total) by mouth every 8 (eight) hours as needed for Nausea  Pre-diabetes  Mass of upper lobe of right lung  DOE (dyspnea on exertion)  CKD (chronic kidney disease) stage 2, GFR 60-89 ml/min  Other orders - neomycin  500 mg tablet; Take 2 tablets (1,000 mg total) by mouth 3 (three) times daily for 3 doses SEE BOWEL PREP INSTRUCTIONS: Take 2 tablets at 2pm, 3pm, and 10pm the day prior to your colon operation. - metroNIDAZOLE  (FLAGYL ) 500 MG tablet; Take 2 tablets (1,000 mg total) by mouth 3 (three) times daily for 3 doses SEE BOWEL PREP INSTRUCTIONS: Take 2 tablets at 2pm, 3pm, and 10pm the day prior to your colon operation. - polyethylene glycol (MIRALAX ) powder; Take 233.75 g by mouth once for 1 dose Take according to your procedure prep instructions. - bisacodyL  (DULCOLAX) 5 mg EC tablet; Take 4 tablets (20 mg total) by mouth once daily as needed for Constipation for up to 1 dose    ASSESSMENT/PLAN  Pleasant woman with persistent iron deficiency anemia. Hiatal hernia and H. pylori gastritis status posttreatment. Large ellipsoid mass in ascending colon that seems to be primarily fat-containing consistent with lipoma with evidence of erosion and persistent iron deficiency anemia on latest colonoscopy.  Because she has a large lipoma in the right colon with iron deficiency anemia and dyspnea on exertion despite treatment of H. pylori gastritis and hysterectomy, I think she would benefit from surgery to remove the bleeding mass. It seems to have gotten slowly larger over time. Reasonable candidate for robotic resection with intracorporeal anastomosis.  The anatomy & physiology of the digestive tract was discussed. The pathophysiology of the colon was discussed. Natural history risks without surgery was discussed. I feel the risks of no intervention will lead to  serious problems that outweigh the operative risks; therefore, I recommended a partial colectomy to remove the pathology. Minimally invasive (Robotic/Laparoscopic) & open techniques were discussed.   Risks such as bleeding, infection, abscess, leak, reoperation, injury to other organs, need for repair of tissues / organs, possible ostomy, hernia, heart attack, stroke, death, and other risks were discussed. I noted a good likelihood this will help address the problem. Goals of post-operative recovery were discussed as well. Need for adequate nutrition, daily bowel regimen and healthy physical activity, to optimize recovery was noted as well. We will work to minimize complications. Educational materials were available as well. Questions were answered. The patient expresses understanding & wishes to proceed with surgery.   Elspeth KYM Schultze, MD, FACS, MASCRS Esophageal, Gastrointestinal & Colorectal Surgery Robotic and Minimally Invasive Surgery  [facility] [address] [place], [place] [address] [phone] Fax [phone] Main  CONTACT INFORMATION: Weekday (9AM-5PM): Call [facility] main office at [phone] Weeknight (5PM-9AM) or Weekend/Holiday: Check EPIC Web Links tab & use AMION (password  [email or web address]) for General Surgery [facility] coverage  Please, DO NOT use SecureChat  (it is not reliable communication to reach operating surgeons & will lead to a delay in care).   Epic staff messaging available for outptient concerns needing 1-2 business day response.  02/03/2024     

## 2024-02-03 NOTE — Anesthesia Postprocedure Evaluation (Signed)
 Anesthesia Post Note  Patient: Kathryn Diaz  Procedure(s) Performed: COLECTOMY, PARTIAL, ROBOT-ASSISTED, LAPAROSCOPIC (Abdomen) LYSIS, ADHESIONS, ROBOT-ASSISTED, LAPAROSCOPIC (Abdomen)     Patient location during evaluation: PACU Anesthesia Type: General Level of consciousness: awake Pain management: pain level controlled Vital Signs Assessment: post-procedure vital signs reviewed and stable Respiratory status: spontaneous breathing, nonlabored ventilation and respiratory function stable Cardiovascular status: blood pressure returned to baseline and stable Postop Assessment: no apparent nausea or vomiting Anesthetic complications: no   No notable events documented.  Last Vitals:  Vitals:   02/03/24 1500 02/03/24 1555  BP: 118/67 106/65  Pulse: (!) 43 (!) 42  Resp: 13 16  Temp:  36.7 C  SpO2: 99% 99%    Last Pain:  Vitals:   02/03/24 1555  TempSrc:   PainSc: Asleep                 Yasmen Cortner P Delonte Musich

## 2024-02-03 NOTE — Anesthesia Procedure Notes (Signed)
 Procedure Name: Intubation Date/Time: 02/03/2024 11:33 AM  Performed by: Gladis Honey, CRNAPre-anesthesia Checklist: Patient identified, Emergency Drugs available, Suction available and Patient being monitored Patient Re-evaluated:Patient Re-evaluated prior to induction Oxygen Delivery Method: Circle System Utilized Preoxygenation: Pre-oxygenation with 100% oxygen Induction Type: IV induction Ventilation: Mask ventilation without difficulty Laryngoscope Size: Miller and 2 Grade View: Grade I Tube type: Oral Number of attempts: 1 Airway Equipment and Method: Stylet and Oral airway Placement Confirmation: ETT inserted through vocal cords under direct vision, positive ETCO2 and breath sounds checked- equal and bilateral Secured at: 22 cm Tube secured with: Tape Dental Injury: Teeth and Oropharynx as per pre-operative assessment

## 2024-02-03 NOTE — Interval H&P Note (Signed)
 History and Physical Interval Note:  02/03/2024 10:34 AM  Kathryn Diaz  has presented today for surgery, with the diagnosis of ASCENDING COLON MASS WITH BLEEDING AND ANEMIA.  The various methods of treatment have been discussed with the patient and family. After consideration of risks, benefits and other options for treatment, the patient has consented to  Procedure(s) with comments: COLECTOMY, PARTIAL, ROBOT-ASSISTED, LAPAROSCOPIC (N/A) - ROBOTIC COLECTOMY PROXIMAL as a surgical intervention.  The patient's history has been reviewed, patient examined, no change in status, stable for surgery.  I have reviewed the patient's chart and labs.  Questions were answered to the patient's satisfaction.    I have re-reviewed the the patient's records, history, medications, and allergies.  I have re-examined the patient.  I again discussed intraoperative plans and goals of post-operative recovery.  The patient agrees to proceed.  Kathryn Diaz  [birth year] [id number]  Patient Care Team: Yolande Toribio MATSU, MD as PCP - General (Internal Medicine) Court Dorn PARAS, MD as Consulting Physician (Cardiology) Sheldon Standing, MD as Consulting Physician (General Surgery) McGreal, Inocente HERO, MD as Consulting Physician (Gastroenterology)  Patient Active Problem List   Diagnosis Date Noted   Peripheral vascular disease (HCC) 01/05/2024   Diabetes mellitus with peripheral vascular disease (HCC) 01/05/2024   Arthritis of midfoot 01/05/2024   Vaginitis and vulvovaginitis 01/05/2024   Lipoma of colon 01/05/2024   Helicobacter pylori gastritis 01/05/2024   Hematochezia 10/28/2023   Impacted cerumen of both ears 10/17/2023   Chronic rhinitis 10/17/2023   Hypertrophy of nasal turbinates 10/17/2023   Postoperative state 09/09/2023   Uterine prolapse 07/09/2023   History of adenomatous polyp of colon 12/28/2017   Obesity (BMI 30-39.9) 12/28/2017   History of right breast cancer 12/28/2017   Gout 12/28/2017    Second degree AV block, Mobitz type I 01/16/2012   Bradycardia 01/16/2012   Chest pain, negative MI, normal Echo, outpt. Nuc. stress test 01/15/2012   Diabetes mellitus (HCC) 01/15/2012   Hypertension 01/15/2012   Hyperlipidemia 01/15/2012   Hypothyroidism 01/15/2012    Past Medical History:  Diagnosis Date   Anemia    Arthritis    Cancer (HCC) 1982   Breast Cancer   Cervicalgia    Chronic gout    04-01-2023  pt stated last episode > 20 yrs ago   CKD (chronic kidney disease), stage III (HCC)    Cystocele with uterine prolapse    Headache(784.0)    History of adenomatous polyp of colon    GI-- dr charlanne   History of hiatal hernia    History of right breast cancer 1982   Hyperlipidemia, mixed    Hypertension    Hypothyroidism    endocrinologist-- dr trixie;  hx benign nodule bx 1998   Mild obstructive sleep apnea    09-03-2023  per pt last used oral appliance since after 09/ 2024 had issue w/ appliance then issue w/ medicare had not got fixed yet   Mobitz type 1 second degree atrioventricular block 12/2011   cardiologist--- dr court;  dx 06/ 2013  w/ bradycardia;  nuclear stress test 01-23-2012  normal perfusion no ischemia, nuclear ef 66%;  echo 01-16-2012 ef 60-65%, G1DD, mild MR/ TR   Type 2 diabetes mellitus [facility])    endocrinologist--- dr trixie;   (09-03-2023 per pt check blood sugar 2 times wkly)   Urethral hypermobility     Past Surgical History:  Procedure Laterality Date   BLADDER SUSPENSION N/A 09/09/2023   Procedure: TRANSVAGINAL TAPE (TVT) PROCEDURE;  Surgeon: Elisabeth Valli BIRCH, MD;  Location: [facility];  Service: Urology;  Laterality: N/A;   BREAST RECONSTRUCTION Right 1983   COLONOSCOPY  12/2017   dr kemper   CYSTO WITH HYDRODISTENSION  09/09/2023   Procedure: CYSTOSCOPY/;  Surgeon: Elisabeth Valli BIRCH, MD;  Location: [facility];  Service: Urology;;   CYSTOCELE REPAIR N/A 09/09/2023   Procedure: ANTERIOR REPAIR (CYSTOCELE);   Surgeon: Elisabeth Valli BIRCH, MD;  Location: [facility];  Service: Urology;  Laterality: N/A;   CYSTOSCOPY N/A 09/09/2023   Procedure: CYSTOSCOPY;  Surgeon: Elisabeth Valli BIRCH, MD;  Location: [facility];  Service: Urology;  Laterality: N/A;   HYSTEROSCOPY WITH D & C N/A 04/08/2023   Procedure: DILATATION AND CURETTAGE /HYSTEROSCOPY;  Surgeon: Sarrah Browning, MD;  Location: [facility];  Service: Gynecology;  Laterality: N/A;   LAPAROSCOPY  11/11/2011   Procedure: LAPAROSCOPY OPERATIVE;  Surgeon: Peggye Gull, MD;  Location: [facility];  Service: Gynecology;  Laterality: N/A;   MASTECTOMY Right 1982   RIGHT BREAST    REPAIR TENDONS FOOT Right 1999   right ankle   RIGHT ANKLE REPAIR Right 1989   SALPINGOOPHORECTOMY  11/11/2011   Procedure: SALPINGO OOPHERECTOMY;  Surgeon: Peggye Gull, MD;  Location: [facility];  Service: Gynecology;  Laterality: Bilateral;   SINUS ENDO WITH FUSION Left 01/07/2021   Procedure: SINUS ENDOSCOPY WITH FUSION NAVIGATION;  Surgeon: Karis Clunes, MD;  Location: [facility];  Service: ENT;  Laterality: Left;   SPHENOIDECTOMY Left 01/07/2021   Procedure: LEFT ENDOSCOPIC  SPHENOIDECTOMY WITH TISSUE REMOVAL;  Surgeon: Karis Clunes, MD;  Location: [facility];  Service: ENT;  Laterality: Left;   TUBAL LIGATION  1974   VAGINAL HYSTERECTOMY N/A 09/09/2023   Procedure: HYSTERECTOMY VAGINAL;  Surgeon: Elisabeth Valli BIRCH, MD;  Location: [facility];  Service: Urology;  Laterality: N/A;    Social History   Socioeconomic History   Marital status: Divorced    Spouse name: Not on file   Number of children: 2   Years of education: Not on file   Highest education level: Not on file  Occupational History   Occupation: Scientist, 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[employer]     Comment: retired  Tobacco Use   Smoking status: Never   Smokeless tobacco: Never  Vaping Use   Vaping status: Never Used   Substance and Sexual Activity   Alcohol  use: No   Drug use: Never   Sexual activity: Not on file  Other Topics Concern   Not on file  Social History Narrative   Not on file   Social Drivers of Health   Financial Resource Strain: Not on file  Food Insecurity: Not on file  Transportation Needs: Not on file  Physical Activity: Not on file  Stress: Not on file  Social Connections: Not on file  Intimate Partner Violence: Not on file    Family History  Problem Relation Age of Onset   Clotting disorder Mother        blood clot   Thyroid  disease Sister    Lung cancer Sister    Heart Problems Brother    Kidney disease Brother    Other Brother        cerebral hemorrhage   Colon cancer Neg Hx    Colon polyps Neg Hx    Esophageal cancer Neg Hx    Stomach cancer Neg Hx  Ulcerative colitis Neg Hx     Medications Prior to Admission  Medication Sig Dispense Refill Last Dose/Taking   allopurinol  (ZYLOPRIM ) 300 MG tablet Take 300 mg by mouth in the morning.   02/03/2024 at  7:30 AM   Blood Glucose Monitoring Suppl (ONE TOUCH ULTRA 2) w/Device KIT Check blood sugar 1 time daily 1 kit 0 Past Month   carvedilol  (COREG ) 3.125 MG tablet Take 3.125 mg by mouth 2 (two) times daily with a meal.   02/03/2024 at  7:30 AM   glucose blood (ONETOUCH VERIO) test strip Use as instructed to check blood sugar 1X daily 100 each 12 Past Month   Lancets (ONETOUCH ULTRASOFT) lancets Use as instructed to check blood sugar 1X daily 100 each 12 Past Month   levothyroxine  (SYNTHROID ) 75 MCG tablet Take 1 tablet (75 mcg total) by mouth daily. 45 tablet 3 02/03/2024 at  7:30 AM   losartan  (COZAAR ) 50 MG tablet Take 50 mg by mouth daily.   02/02/2024   metFORMIN  (GLUCOPHAGE ) 500 MG tablet Take 500 mg by mouth daily with breakfast.   Past Month   OneTouch Delica Lancets 30G MISC Check blood sugar 1 time daly 100 each 12 Past Month   rosuvastatin  (CRESTOR ) 20 MG tablet Take 20 mg by mouth in the morning.   02/02/2024  Morning   triamterene -hydrochlorothiazide  (MAXZIDE -25) 37.5-25 MG tablet Take 1 tablet by mouth every morning.   02/02/2024   acetaminophen  (TYLENOL ) 650 MG CR tablet Take 650 mg by mouth every 8 (eight) hours as needed for pain.   More than a month    Current Facility-Administered Medications  Medication Dose Route Frequency Provider Last Rate Last Admin   bupivacaine  liposome (EXPAREL ) 1.3 % injection 266 mg  20 mL Infiltration Once Sheldon Standing, MD       cefoTEtan  (CEFOTAN ) 2 g in sodium chloride  0.9 % 100 mL IVPB  2 g Intravenous On Call to OR Sheldon Standing, MD       insulin  aspart (novoLOG ) injection 0-7 Units  0-7 Units Subcutaneous Q2H PRN Ellender, Bernardino SQUIBB, MD       lactated ringers  infusion   Intravenous Continuous Leonce Athens, MD         No Known Allergies  BP 113/69   Pulse (!) 51   Temp 98.5 F (36.9 C) (Oral)   Resp 16   Ht 5' 5 (1.651 m)   Wt 90 kg   SpO2 98%   BMI 33.02 kg/m   Labs: Results for orders placed or performed during the hospital encounter of 02/03/24 (from the past 48 hours)  Glucose, capillary     Status: Abnormal   Collection Time: 02/03/24  9:38 AM  Result Value Ref Range   Glucose-Capillary 109 (H) 70 - 99 mg/dL    Comment: Glucose reference range applies only to samples taken after fasting for at least 8 hours.   Comment 1 Notify RN    Comment 2 Document in Chart     Imaging / Studies: No results found.   Briant KYM Sheldon, M.D., F.A.C.S. Gastrointestinal and Minimally Invasive Surgery [facility] [address] [place], [place] [address] [phone] Main / Paging  02/03/2024 10:34 AM    Standing JAYSON Sheldon

## 2024-02-04 ENCOUNTER — Encounter (HOSPITAL_COMMUNITY): Payer: Self-pay | Admitting: Surgery

## 2024-02-04 ENCOUNTER — Telehealth: Payer: Self-pay

## 2024-02-04 LAB — CBC
HCT: 27.4 % — ABNORMAL LOW (ref 36.0–46.0)
Hemoglobin: 8.6 g/dL — ABNORMAL LOW (ref 12.0–15.0)
MCH: 25.4 pg — ABNORMAL LOW (ref 26.0–34.0)
MCHC: 31.4 g/dL (ref 30.0–36.0)
MCV: 81.1 fL (ref 80.0–100.0)
Platelets: 268 K/uL (ref 150–400)
RBC: 3.38 MIL/uL — ABNORMAL LOW (ref 3.87–5.11)
RDW: 18.2 % — ABNORMAL HIGH (ref 11.5–15.5)
WBC: 10.7 K/uL — ABNORMAL HIGH (ref 4.0–10.5)
nRBC: 0 % (ref 0.0–0.2)

## 2024-02-04 LAB — GLUCOSE, CAPILLARY
Glucose-Capillary: 133 mg/dL — ABNORMAL HIGH (ref 70–99)
Glucose-Capillary: 137 mg/dL — ABNORMAL HIGH (ref 70–99)
Glucose-Capillary: 133 mg/dL — ABNORMAL HIGH (ref 70–99)
Glucose-Capillary: 131 mg/dL — ABNORMAL HIGH (ref 70–99)

## 2024-02-04 LAB — BASIC METABOLIC PANEL WITH GFR
Anion gap: 8 (ref 5–15)
BUN: 13 mg/dL (ref 8–23)
CO2: 21 mmol/L — ABNORMAL LOW (ref 22–32)
Calcium: 8.6 mg/dL — ABNORMAL LOW (ref 8.9–10.3)
Chloride: 101 mmol/L (ref 98–111)
Creatinine, Ser: 1.05 mg/dL — ABNORMAL HIGH (ref 0.44–1.00)
GFR, Estimated: 56 mL/min — ABNORMAL LOW (ref >60)
Glucose, Bld: 218 mg/dL — ABNORMAL HIGH (ref 70–99)
Potassium: 3.6 mmol/L (ref 3.5–5.1)
Sodium: 130 mmol/L — ABNORMAL LOW (ref 135–145)

## 2024-02-04 LAB — MAGNESIUM: Magnesium: 1.9 mg/dL (ref 1.7–2.4)

## 2024-02-04 LAB — HEMOGLOBIN A1C
Hgb A1c MFr Bld: 6 % — ABNORMAL HIGH (ref 4.8–5.6)
Mean Plasma Glucose: 126 mg/dL

## 2024-02-04 MED ORDER — INSULIN ASPART 100 UNIT/ML IJ SOLN: 0.0000 [IU] | Freq: Every day | INTRAMUSCULAR | Status: DC

## 2024-02-04 MED ORDER — INSULIN ASPART 100 UNIT/ML IJ SOLN
0.0000 [IU] | Freq: Three times a day (TID) | INTRAMUSCULAR | Status: DC
  Administered 2024-02-04 (×3): 2 [IU] via SUBCUTANEOUS

## 2024-02-04 NOTE — Plan of Care (Signed)
  Problem: Nutritional: Goal: Will attain and maintain optimal nutritional status will improve Outcome: Progressing   Problem: Nutrition: Goal: Adequate nutrition will be maintained Outcome: Progressing

## 2024-02-04 NOTE — Telephone Encounter (Signed)
 Copied from CRM [id number]. Topic: Appointments - Scheduling Inquiry for Clinic >> Jan 21, 2024 11:04 AM Corean SAUNDERS wrote: Reason for CRM: Patient states she was seen at [facility] yesterday 6/25 for the same reason that she has an appointment with Camie Lites on 7/9 - Patient is wanting to confirm if she still needs to keep this appointment with Camie Lites.   Patient canceled appointment.NFN

## 2024-02-04 NOTE — Progress Notes (Signed)
   02/04/24 1252  TOC Brief Assessment  Insurance and Status Reviewed  Patient has primary care physician Yes  Home environment has been reviewed home alone  Prior level of function: independent  Prior/Current Home Services No current home services  Social Drivers of Health Review SDOH reviewed no interventions necessary  Readmission risk has been reviewed Yes  Transition of care needs no transition of care needs at this time

## 2024-02-04 NOTE — Progress Notes (Signed)
 02/04/2024  Kathryn Diaz [id number] Apr 29, 1951  CARE TEAM: PCP: Yolande Toribio MATSU, MD  Outpatient Care Team: Patient Care Team: Yolande Toribio MATSU, MD as PCP - General (Internal Medicine) Court Dorn PARAS, MD as Consulting Physician (Cardiology) Sheldon Standing, MD as Consulting Physician (General Surgery) McGreal, Inocente HERO, MD as Consulting Physician (Gastroenterology)  Inpatient Treatment Team: Treatment Team:  Sheldon Standing, MD Lenette Barron DEL, RN   Problem List:   Principal Problem:   Ascneding colon lipoma with intussusception & bleeding s/p colectomy 02/03/2024 Active Problems:   Mass of colon   02/03/2024  POST-OPERATIVE DIAGNOSIS:  ASCENDING COLON MASS WITH INTUSSUSCEPTION, BLEEDING, AND ANEMIA   PROCEDURE:   -ROBOTIC PROXIMAL RIGHT COLECTOMY WITH ANASTOMOSIS -LYSIS OF ADHESIONS x75 MINUTES (50% OF CASE) -TRANSVERSUS ABDOMINIS PLANE (TAP) BLOCK - BILATERAL   SURGEON:  Standing KYM Sheldon, MD    Assessment [facility] Stay = 1 days) 1 Day Post-Op    Recovering well so far    Plan:  ERAS protocol  IV to med lock.  And as needed boluses.  Tolerating liquids.  Advance diet gradually.    Follow-up in pathology.  Most likely intussuscepting chronic colonic lipoma.  Foley catheter out  Postoperative hyperglycemia in the setting of diabetes.  He does tend to avoid medications.  Will do sliding scale for now agree group.  Her A1c was not markedly elevated so hopefully a temporary hyperglycemia due to perioperative PONV steroids.  -monitor electrolytes & replace as needed  Keep K>4, Mg>2, Phos>3  -VTE prophylaxis- SCDs.  Anticoagulation prophyllaxis SQ as appropriate  -mobilize as tolerated to help recovery.  Enlist therapies in moderate/high risk patients as appropriate  I updated the patient's status to the patient and daughter  Recommendations were made.  Questions were answered.  They expressed understanding & appreciation.  -Disposition:   Disposition:  The patient is from: Home Anticipate discharge to:  Home Anticipated Date of Discharge is:  July 11,2025   Barriers to discharge:  Pending Clinical improvement (more likely than not)  Patient currently is NOT MEDICALLY STABLE for discharge from the hospital from a surgery standpoint.      I reviewed last 24 h vitals and pain scores, last 48 h intake and output, last 24 h labs and trends, and last 24 h imaging results.  I have reviewed this patient's available data, including medical history, events of note, test results, etc as part of my evaluation.   A significant portion of that time was spent in counseling. Care during the described time interval was provided by me.  This care required moderate level of medical decision making.  02/04/2024    Subjective: (Chief complaint)  Patient feeling better overall.  A little lightheaded getting out of bed but feeling stronger today.  Tolerating liquids.    Family in the room.  Objective:  Vital signs:  Vitals:   02/03/24 1816 02/03/24 2118 02/04/24 0155 02/04/24 0632  BP: 100/63 101/68 102/70 (!) 96/57  Pulse: (!) 48  (!) 58   Resp: 16 15 15 15   Temp: (!) 97.4 F (36.3 C) 97.6 F (36.4 C) 98.1 F (36.7 C) 98.1 F (36.7 C)  TempSrc: Oral Oral Oral Oral  SpO2: 100% 100% 100% 96%  Weight:      Height:           Intake/Output   Yesterday:  07/09 0701 - 07/10 0700 In: 2550.8 [P.O.:1200; I.V.:1250.8; IV Piggyback:100] Out: 1150 [Urine:1150] This shift:  No intake/output data recorded.  Bowel function:  Flatus: YES  BM:  No  Drain: (No drain)   Physical Exam:  General: Pt awake/alert in no acute distress Eyes: PERRL, normal EOM.  Sclera clear.  No icterus Neuro: CN II-XII intact w/o focal sensory/motor deficits. Lymph: No head/neck/groin lymphadenopathy Psych:  No delerium/psychosis/paranoia.  Oriented x 4 HENT: Normocephalic, Mucus membranes moist.  No thrush Neck: Supple, No tracheal  deviation.  No obvious thyromegaly Chest: No pain to chest wall compression.  Good respiratory excursion.  No audible wheezing CV:  Pulses intact.  Regular rhythm.  No major extremity edema MS: Normal AROM mjr joints.  No obvious deformity  Abdomen: Soft.  Nondistended.  Nontender.  Dressings clean dry and intact.  no evidence of peritonitis.  No incarcerated hernias.  Ext:  No deformity.  No mjr edema.  No cyanosis Skin: No petechiae / purpurea.  No major sores.  Warm and dry    Results:   Cultures: No results found for this or any previous visit (from the past 720 hours).  Labs: Results for orders placed or performed during the hospital encounter of 02/03/24 (from the past 48 hours)  Glucose, capillary     Status: Abnormal   Collection Time: 02/03/24  9:38 AM  Result Value Ref Range   Glucose-Capillary 109 (H) 70 - 99 mg/dL    Comment: Glucose reference range applies only to samples taken after fasting for at least 8 hours.   Comment 1 Notify RN    Comment 2 Document in Chart   Glucose, capillary     Status: Abnormal   Collection Time: 02/03/24  2:44 PM  Result Value Ref Range   Glucose-Capillary 142 (H) 70 - 99 mg/dL    Comment: Glucose reference range applies only to samples taken after fasting for at least 8 hours.  Hemoglobin A1c     Status: Abnormal   Collection Time: 02/03/24  5:51 PM  Result Value Ref Range   Hgb A1c MFr Bld 6.0 (H) 4.8 - 5.6 %    Comment: (NOTE)         Prediabetes: 5.7 - 6.4         Diabetes: >6.4         Glycemic control for adults with diabetes: <7.0    Mean Plasma Glucose 126 mg/dL    Comment: (NOTE) Performed At: [facility] [address] [place], [place] [id number] Jennette Shorter MD Ey:[id number]   Basic metabolic panel     Status: Abnormal   Collection Time: 02/04/24  4:41 AM  Result Value Ref Range   Sodium 130 (L) 135 - 145 mmol/L   Potassium 3.6 3.5 - 5.1 mmol/L   Chloride 101 98 - 111 mmol/L   CO2 21 (L) 22 - 32  mmol/L   Glucose, Bld 218 (H) 70 - 99 mg/dL    Comment: Glucose reference range applies only to samples taken after fasting for at least 8 hours.   BUN 13 8 - 23 mg/dL   Creatinine, Ser 8.94 (H) 0.44 - 1.00 mg/dL   Calcium  8.6 (L) 8.9 - 10.3 mg/dL   GFR, Estimated 56 (L) >60 mL/min    Comment: (NOTE) Calculated using the CKD-EPI Creatinine Equation (2021)    Anion gap 8 5 - 15    Comment: Performed at [facility], [address] [place], [place] [address]  CBC     Status: Abnormal   Collection Time: 02/04/24  4:41 AM  Result Value Ref Range   WBC 10.7 (H) 4.0 -  10.5 K/uL   RBC 3.38 (L) 3.87 - 5.11 MIL/uL   Hemoglobin 8.6 (L) 12.0 - 15.0 g/dL   HCT 72.5 (L) 63.9 - 53.9 %   MCV 81.1 80.0 - 100.0 fL   MCH 25.4 (L) 26.0 - 34.0 pg   MCHC 31.4 30.0 - 36.0 g/dL   RDW 81.7 (H) 88.4 - 84.4 %   Platelets 268 150 - 400 K/uL   nRBC 0.0 0.0 - 0.2 %    Comment: Performed at [facility], [address], [place], [place] [address]  Magnesium     Status: None   Collection Time: 02/04/24  4:41 AM  Result Value Ref Range   Magnesium 1.9 1.7 - 2.4 mg/dL    Comment: Performed at [facility], [address], [place], [place] [address]    Imaging / Studies: No results found.  Medications / Allergies: per chart  Antibiotics: Anti-infectives (From admission, onward)    Start     Dose/Rate Route Frequency Ordered Stop   02/03/24 2200  cefoTEtan  (CEFOTAN ) 2 g in sodium chloride  0.9 % 100 mL IVPB        2 g 200 mL/hr over 30 Minutes Intravenous Every 12 hours 02/03/24 1603 02/03/24 2142   02/03/24 1400  neomycin  (MYCIFRADIN ) tablet 1,000 mg  Status:  Discontinued       Placed in And Linked Group   1,000 mg Oral 3 times per day 02/03/24 0950 02/03/24 0952   02/03/24 1400  metroNIDAZOLE  (FLAGYL ) tablet 1,000 mg  Status:  Discontinued       Placed in And Linked Group   1,000 mg Oral 3 times per day 02/03/24 0950 02/03/24 0952    02/03/24 1000  cefoTEtan  (CEFOTAN ) 2 g in sodium chloride  0.9 % 100 mL IVPB        2 g 200 mL/hr over 30 Minutes Intravenous On call to O.R. 02/03/24 0950 02/03/24 1219         Note: Portions of this report may have been transcribed using voice recognition software. Every effort was made to ensure accuracy; however, inadvertent computerized transcription errors may be present.   Any transcriptional errors that result from this process are unintentional.    Elspeth KYM Schultze, MD, FACS, MASCRS Esophageal, Gastrointestinal & Colorectal Surgery Robotic and Minimally Invasive Surgery  [facility] [address], [address] [place], [place] [address] [phone] Fax [phone] Main  CONTACT INFORMATION: Weekday (9AM-5PM): Call [facility] main office at [phone] Weeknight (5PM-9AM) or Weekend/Holiday: Check EPIC Web Links tab & use AMION (password  [email or web address]) for General Surgery [facility] coverage  Please, DO NOT use SecureChat  (it is not reliable communication to reach operating surgeons & will lead to a delay in care).   Epic staff messaging available for outptient concerns needing 1-2 business day response.      02/04/2024  7:05 AM

## 2024-02-05 ENCOUNTER — Ambulatory Visit: Payer: Self-pay | Admitting: Surgery

## 2024-02-05 LAB — GLUCOSE, CAPILLARY: Glucose-Capillary: 93 mg/dL (ref 70–99)

## 2024-02-05 LAB — SURGICAL PATHOLOGY

## 2024-02-05 NOTE — Plan of Care (Signed)
  Problem: Education: Goal: Understanding of discharge needs will improve Outcome: Adequate for Discharge Goal: Verbalization of understanding of the causes of altered bowel function will improve Outcome: Adequate for Discharge   Problem: Activity: Goal: Ability to tolerate increased activity will improve Outcome: Adequate for Discharge   Problem: Bowel/Gastric: Goal: Gastrointestinal status for postoperative course will improve Outcome: Adequate for Discharge   Problem: Health Behavior/Discharge Planning: Goal: Identification of community resources to assist with postoperative recovery needs will improve Outcome: Adequate for Discharge   Problem: Nutritional: Goal: Will attain and maintain optimal nutritional status will improve Outcome: Adequate for Discharge   Problem: Clinical Measurements: Goal: Postoperative complications will be avoided or minimized Outcome: Adequate for Discharge   Problem: Respiratory: Goal: Respiratory status will improve Outcome: Adequate for Discharge   Problem: Skin Integrity: Goal: Will show signs of wound healing Outcome: Adequate for Discharge   Problem: Education: Goal: Knowledge of General Education information will improve Description: Including pain rating scale, medication(s)/side effects and non-pharmacologic comfort measures Outcome: Adequate for Discharge   Problem: Health Behavior/Discharge Planning: Goal: Ability to manage health-related needs will improve Outcome: Adequate for Discharge   Problem: Clinical Measurements: Goal: Ability to maintain clinical measurements within normal limits will improve Outcome: Adequate for Discharge Goal: Will remain free from infection Outcome: Adequate for Discharge Goal: Diagnostic test results will improve Outcome: Adequate for Discharge Goal: Respiratory complications will improve Outcome: Adequate for Discharge Goal: Cardiovascular complication will be avoided Outcome: Adequate for  Discharge   Problem: Activity: Goal: Risk for activity intolerance will decrease Outcome: Adequate for Discharge   Problem: Nutrition: Goal: Adequate nutrition will be maintained Outcome: Adequate for Discharge   Problem: Coping: Goal: Level of anxiety will decrease Outcome: Adequate for Discharge   Problem: Elimination: Goal: Will not experience complications related to bowel motility Outcome: Adequate for Discharge Goal: Will not experience complications related to urinary retention Outcome: Adequate for Discharge   Problem: Pain Managment: Goal: General experience of comfort will improve and/or be controlled Outcome: Adequate for Discharge   Problem: Safety: Goal: Ability to remain free from injury will improve Outcome: Adequate for Discharge   Problem: Skin Integrity: Goal: Risk for impaired skin integrity will decrease Outcome: Adequate for Discharge   Problem: Education: Goal: Ability to describe self-care measures that may prevent or decrease complications (Diabetes Survival Skills Education) will improve Outcome: Adequate for Discharge Goal: Individualized Educational Video(s) Outcome: Adequate for Discharge   Problem: Coping: Goal: Ability to adjust to condition or change in health will improve Outcome: Adequate for Discharge   Problem: Fluid Volume: Goal: Ability to maintain a balanced intake and output will improve Outcome: Adequate for Discharge   Problem: Health Behavior/Discharge Planning: Goal: Ability to identify and utilize available resources and services will improve Outcome: Adequate for Discharge Goal: Ability to manage health-related needs will improve Outcome: Adequate for Discharge   Problem: Metabolic: Goal: Ability to maintain appropriate glucose levels will improve Outcome: Adequate for Discharge   Problem: Nutritional: Goal: Maintenance of adequate nutrition will improve Outcome: Adequate for Discharge Goal: Progress toward  achieving an optimal weight will improve Outcome: Adequate for Discharge   Problem: Skin Integrity: Goal: Risk for impaired skin integrity will decrease Outcome: Adequate for Discharge   Problem: Tissue Perfusion: Goal: Adequacy of tissue perfusion will improve Outcome: Adequate for Discharge

## 2024-02-05 NOTE — Discharge Summary (Signed)
 Physician Discharge Summary    Kathryn Diaz MRN: [id number] DOB/AGE: [birth year] = 74 y.o.  Patient Care Team: Yolande Toribio MATSU, MD as PCP - General (Internal Medicine) Court Dorn PARAS, MD as Consulting Physician (Cardiology) Sheldon Standing, MD as Consulting Physician (General Surgery) McGreal, Inocente HERO, MD as Consulting Physician (Gastroenterology)  Admit date: 02/03/2024  Discharge date: 02/05/2024  Hospital Stay = 2 days    Discharge Diagnoses:  Principal Problem:   Ascending colon lipoma with intussusception & bleeding s/p colectomy 02/03/2024 Active Problems:   Diabetes mellitus (HCC)   Hypertension   Hypothyroidism   Second degree AV block, Mobitz type I   Bradycardia   Obesity (BMI 30-39.9)   Mass of colon   2 Days Post-Op  02/03/2024  POST-OPERATIVE DIAGNOSIS:   ASCENDING COLON MASS WITH BLEEDING AND ANEMIA  SURGERY:  02/03/2024  Procedure(s): COLECTOMY, PARTIAL, ROBOT-ASSISTED, LAPAROSCOPIC LYSIS, ADHESIONS, ROBOT-ASSISTED, LAPAROSCOPIC  SURGEON:    Surgeon(s): Debby Hila, MD Debby Hila, MD Sheldon Standing, MD  Consults: Anesthesia  Hospital Course:   The patient underwent the surgery above.  Postoperatively, the patient gradually mobilized and advanced to a solid diet.  Pain and other symptoms were treated aggressively.    By the time of discharge, the patient was walking well the hallways, eating food, having flatus.  Pain was well-controlled on an oral medications.  Based on meeting discharge criteria and continuing to recover, I felt it was safe for the patient to be discharged from the hospital to further recover with close followup. Postoperative recommendations were discussed in detail.  They are written as well.  Discharged Condition: good  Discharge Exam: Blood pressure 111/68, pulse (!) 53, temperature 98 F (36.7 C), temperature source Oral, resp. rate 18, height 5' 5 (1.651 m), weight 100.4 kg, SpO2 98%.  General: Pt  awake/alert/oriented x4 in No acute distress Eyes: PERRL, normal EOM.  Sclera clear.  No icterus Neuro: CN II-XII intact w/o focal sensory/motor deficits. Lymph: No head/neck/groin lymphadenopathy Psych:  No delerium/psychosis/paranoia HENT: Normocephalic, Mucus membranes moist.  No thrush Neck: Supple, No tracheal deviation Chest:  No chest wall pain w good excursion CV:  Pulses intact.  Regular rhythm MS: Normal AROM mjr joints.  No obvious deformity Abdomen: Soft.  Nondistended.  Mildly tender at incisions only.  No evidence of peritonitis.  No incarcerated hernias. Ext:  SCDs BLE.  No mjr edema.  No cyanosis Skin: No petechiae / purpura   Disposition:    Follow-up Information     Sheldon Standing, MD Follow up on 03/01/2024.   Specialties: General Surgery, Colon and Rectal Surgery Why: To follow up after your operation Contact information: [address] [place] [address] [phone]                 Discharge disposition: 01-Home or Self Care       Discharge Instructions     Call MD for:   Complete by: As directed    FEVER > 101.5 F  (temperatures < 101.5 F are not significant)   Call MD for:  extreme fatigue   Complete by: As directed    Call MD for:  persistant dizziness or light-headedness   Complete by: As directed    Call MD for:  persistant nausea and vomiting   Complete by: As directed    Call MD for:  redness, tenderness, or signs of infection (pain, swelling, redness, odor or green/yellow discharge around incision site)   Complete by: As directed  Call MD for:  severe uncontrolled pain   Complete by: As directed    Diet - low sodium heart healthy   Complete by: As directed    Start with a bland diet such as soups, liquids, starchy foods, low fat foods, etc. the first few days at home. Gradually advance to a solid, low-fat, high fiber diet by the end of the first week at home.   Add a fiber supplement to your diet (Metamucil,  etc) If you feel full, bloated, or constipated, stay on a full liquid or pureed/blenderized diet for a few days until you feel better and are no longer constipated.   Discharge instructions   Complete by: As directed    See Discharge Instructions If you are not getting better after two weeks or are noticing you are getting worse, contact our office [phone] for further advice.  We may need to adjust your medications, re-evaluate you in the office, send you to the emergency room, or see what other things we can do to help. The clinic staff is available to answer your questions during regular business hours (8:30am-5pm).  Please don't hesitate to call and ask to speak to one of our nurses for clinical concerns.    A surgeon from [facility] is always on call at the hospitals 24 hours/day If you have a medical emergency, go to the nearest emergency room or call 911.   Discharge wound care:   Complete by: As directed    It is good for closed incisions and even open wounds to be washed every day.  Shower every day.  Short baths are fine.  Wash the incisions and wounds clean with soap & water .    You may leave closed incisions open to air if it is dry.   You may cover the incision with clean gauze & replace it after your daily shower for comfort.  TEGADERM:  You have clear gauze band-aid dressings over your closed incision(s).  Remove the dressings 2 days after surgery.   Driving Restrictions   Complete by: As directed    You may drive when: - you are no longer taking narcotic prescription pain medication - you can comfortably wear a seatbelt - you can safely make sudden turns/stops without pain.   Increase activity slowly   Complete by: As directed    Start light daily activities --- self-care, walking, climbing stairs- beginning the day after surgery.  Gradually increase activities as tolerated.  Control your pain to be active.  Stop when you are tired.  Ideally, walk several  times a day, eventually an hour a day.   Most people are back to most day-to-day activities in a few weeks.  It takes 4-6 weeks to get back to unrestricted, intense activity. If you can walk 30 minutes without difficulty, it is safe to try more intense activity such as jogging, treadmill, bicycling, low-impact aerobics, swimming, etc. Save the most intensive and strenuous activity for last (Usually 4-8 weeks after surgery) such as sit-ups, heavy lifting, contact sports, etc.  Refrain from any intense heavy lifting or straining until you are off narcotics for pain control.  You will have off days, but things should improve week-by-week. DO NOT PUSH THROUGH PAIN.  Let pain be your guide: If it hurts to do something, don't do it.   Lifting restrictions   Complete by: As directed    If you can walk 30 minutes without difficulty, it is safe to try more intense  activity such as jogging, treadmill, bicycling, low-impact aerobics, swimming, etc. Save the most intensive and strenuous activity for last (Usually 4-8 weeks after surgery) such as sit-ups, heavy lifting, contact sports, etc.   Refrain from any intense heavy lifting or straining until you are off narcotics for pain control.  You will have off days, but things should improve week-by-week. DO NOT PUSH THROUGH PAIN.  Let pain be your guide: If it hurts to do something, don't do it.  Pain is your body warning you to avoid that activity for another week until the pain goes down.   May shower / Bathe   Complete by: As directed    May walk up steps   Complete by: As directed    Remove dressing in 48 hours   Complete by: As directed    Make sure all dressings have been removed on the second day after surgery = 7/11 Friday Leave incisions open to air.  OK to cover incisions with gauze or bandages as desired   Sexual Activity Restrictions   Complete by: As directed    You may have sexual intercourse when it is comfortable. If it hurts to do something,  stop.       Allergies as of 02/05/2024   No Known Allergies      Medication List     TAKE these medications    acetaminophen  650 MG CR tablet Commonly known as: TYLENOL  Take 650 mg by mouth every 8 (eight) hours as needed for pain.   allopurinol  300 MG tablet Commonly known as: ZYLOPRIM  Take 300 mg by mouth in the morning.   carvedilol  3.125 MG tablet Commonly known as: COREG  Take 3.125 mg by mouth 2 (two) times daily with a meal.   levothyroxine  75 MCG tablet Commonly known as: SYNTHROID  Take 1 tablet (75 mcg total) by mouth daily.   losartan  50 MG tablet Commonly known as: COZAAR  Take 50 mg by mouth daily.   metFORMIN  500 MG tablet Commonly known as: GLUCOPHAGE  Take 500 mg by mouth daily with breakfast.   ONE TOUCH ULTRA 2 w/Device Kit Check blood sugar 1 time daily   onetouch ultrasoft lancets Use as instructed to check blood sugar 1X daily   OneTouch Delica Lancets 30G Misc Check blood sugar 1 time daly   OneTouch Verio test strip Generic drug: glucose blood Use as instructed to check blood sugar 1X daily   rosuvastatin  20 MG tablet Commonly known as: CRESTOR  Take 20 mg by mouth in the morning.   traMADol  50 MG tablet Commonly known as: ULTRAM  Take 1-2 tablets (50-100 mg total) by mouth every 6 (six) hours as needed for moderate pain (pain score 4-6) or severe pain (pain score 7-10).   triamterene -hydrochlorothiazide  37.5-25 MG tablet Commonly known as: MAXZIDE -25 Take 1 tablet by mouth every morning.               Discharge Care Instructions  (From admission, onward)           Start     Ordered   02/03/24 0000  Discharge wound care:       Comments: It is good for closed incisions and even open wounds to be washed every day.  Shower every day.  Short baths are fine.  Wash the incisions and wounds clean with soap & water .    You may leave closed incisions open to air if it is dry.   You may cover the incision with clean gauze &  replace it after  your daily shower for comfort.  TEGADERM:  You have clear gauze band-aid dressings over your closed incision(s).  Remove the dressings 2 days after surgery.   02/03/24 1052            Significant Diagnostic Studies:  Results for orders placed or performed during the hospital encounter of 02/03/24 (from the past 72 hours)  Glucose, capillary     Status: Abnormal   Collection Time: 02/03/24  9:38 AM  Result Value Ref Range   Glucose-Capillary 109 (H) 70 - 99 mg/dL    Comment: Glucose reference range applies only to samples taken after fasting for at least 8 hours.   Comment 1 Notify RN    Comment 2 Document in Chart   Glucose, capillary     Status: Abnormal   Collection Time: 02/03/24  2:44 PM  Result Value Ref Range   Glucose-Capillary 142 (H) 70 - 99 mg/dL    Comment: Glucose reference range applies only to samples taken after fasting for at least 8 hours.  Hemoglobin A1c     Status: Abnormal   Collection Time: 02/03/24  5:51 PM  Result Value Ref Range   Hgb A1c MFr Bld 6.0 (H) 4.8 - 5.6 %    Comment: (NOTE)         Prediabetes: 5.7 - 6.4         Diabetes: >6.4         Glycemic control for adults with diabetes: <7.0    Mean Plasma Glucose 126 mg/dL    Comment: (NOTE) Performed At: [facility] [address] [place], [place] [id number] Jennette Shorter MD Ey:[id number]   Basic metabolic panel     Status: Abnormal   Collection Time: 02/04/24  4:41 AM  Result Value Ref Range   Sodium 130 (L) 135 - 145 mmol/L   Potassium 3.6 3.5 - 5.1 mmol/L   Chloride 101 98 - 111 mmol/L   CO2 21 (L) 22 - 32 mmol/L   Glucose, Bld 218 (H) 70 - 99 mg/dL    Comment: Glucose reference range applies only to samples taken after fasting for at least 8 hours.   BUN 13 8 - 23 mg/dL   Creatinine, Ser 8.94 (H) 0.44 - 1.00 mg/dL   Calcium  8.6 (L) 8.9 - 10.3 mg/dL   GFR, Estimated 56 (L) >60 mL/min    Comment: (NOTE) Calculated using the CKD-EPI Creatinine Equation  (2021)    Anion gap 8 5 - 15    Comment: Performed at [facility], [address], [place], [place] [address]  CBC     Status: Abnormal   Collection Time: 02/04/24  4:41 AM  Result Value Ref Range   WBC 10.7 (H) 4.0 - 10.5 K/uL   RBC 3.38 (L) 3.87 - 5.11 MIL/uL   Hemoglobin 8.6 (L) 12.0 - 15.0 g/dL   HCT 72.5 (L) 63.9 - 53.9 %   MCV 81.1 80.0 - 100.0 fL   MCH 25.4 (L) 26.0 - 34.0 pg   MCHC 31.4 30.0 - 36.0 g/dL   RDW 81.7 (H) 88.4 - 84.4 %   Platelets 268 150 - 400 K/uL   nRBC 0.0 0.0 - 0.2 %    Comment: Performed at [facility], [address] [place], [place] [address]  Magnesium     Status: None   Collection Time: 02/04/24  4:41 AM  Result Value Ref Range   Magnesium 1.9 1.7 - 2.4 mg/dL    Comment: Performed  at [facility], [address], [place], [place] [address]  Glucose, capillary     Status: Abnormal   Collection Time: 02/04/24  8:34 AM  Result Value Ref Range   Glucose-Capillary 133 (H) 70 - 99 mg/dL    Comment: Glucose reference range applies only to samples taken after fasting for at least 8 hours.  Glucose, capillary     Status: Abnormal   Collection Time: 02/04/24 11:32 AM  Result Value Ref Range   Glucose-Capillary 137 (H) 70 - 99 mg/dL    Comment: Glucose reference range applies only to samples taken after fasting for at least 8 hours.  Glucose, capillary     Status: Abnormal   Collection Time: 02/04/24  4:31 PM  Result Value Ref Range   Glucose-Capillary 133 (H) 70 - 99 mg/dL    Comment: Glucose reference range applies only to samples taken after fasting for at least 8 hours.  Glucose, capillary     Status: Abnormal   Collection Time: 02/04/24  9:27 PM  Result Value Ref Range   Glucose-Capillary 131 (H) 70 - 99 mg/dL    Comment: Glucose reference range applies only to samples taken after fasting for at least 8 hours.  Glucose, capillary     Status: None   Collection Time: 02/05/24  7:55 AM   Result Value Ref Range   Glucose-Capillary 93 70 - 99 mg/dL    Comment: Glucose reference range applies only to samples taken after fasting for at least 8 hours.    No results found.  Past Medical History:  Diagnosis Date   Anemia    Arthritis    Cancer (HCC) 1982   Breast Cancer   Cervicalgia    Chronic gout    04-01-2023  pt stated last episode > 20 yrs ago   CKD (chronic kidney disease), stage III (HCC)    Cystocele with uterine prolapse    Headache(784.0)    History of adenomatous polyp of colon    GI-- dr charlanne   History of hiatal hernia    History of right breast cancer 1982   Hyperlipidemia, mixed    Hypertension    Hypothyroidism    endocrinologist-- dr trixie;  hx benign nodule bx 1998   Mild obstructive sleep apnea    09-03-2023  per pt last used oral appliance since after 09/ 2024 had issue w/ appliance then issue w/ medicare had not got fixed yet   Mobitz type 1 second degree atrioventricular block 12/2011   cardiologist--- dr court;  dx 06/ 2013  w/ bradycardia;  nuclear stress test 01-23-2012  normal perfusion no ischemia, nuclear ef 66%;  echo 01-16-2012 ef 60-65%, G1DD, mild MR/ TR   Type 2 diabetes mellitus [facility])    endocrinologist--- dr trixie;   (09-03-2023 per pt check blood sugar 2 times wkly)   Urethral hypermobility     Past Surgical History:  Procedure Laterality Date   BLADDER SUSPENSION N/A 09/09/2023   Procedure: TRANSVAGINAL TAPE (TVT) PROCEDURE;  Surgeon: Elisabeth Valli BIRCH, MD;  Location: [facility];  Service: Urology;  Laterality: N/A;   BREAST RECONSTRUCTION Right 1983   COLONOSCOPY  12/2017   dr kemper   CYSTO WITH HYDRODISTENSION  09/09/2023   Procedure: CYSTOSCOPY/;  Surgeon: Elisabeth Valli BIRCH, MD;  Location: [facility];  Service: Urology;;   CYSTOCELE REPAIR N/A 09/09/2023   Procedure: ANTERIOR REPAIR (CYSTOCELE);  Surgeon: Elisabeth Valli BIRCH, MD;  Location: [facility];  Service: Urology;   Laterality: N/A;   CYSTOSCOPY N/A 09/09/2023   Procedure: CYSTOSCOPY;  Surgeon: Elisabeth Valli BIRCH, MD;  Location: [facility];  Service: Urology;  Laterality: N/A;   HYSTEROSCOPY WITH D & C N/A 04/08/2023   Procedure: DILATATION AND CURETTAGE /HYSTEROSCOPY;  Surgeon: Sarrah Browning, MD;  Location: [facility] ;  Service: Gynecology;  Laterality: N/A;   LAPAROSCOPY  11/11/2011   Procedure: LAPAROSCOPY OPERATIVE;  Surgeon: Peggye Gull, MD;  Location: [facility];  Service: Gynecology;  Laterality: N/A;   MASTECTOMY Right 1982   RIGHT BREAST    REPAIR TENDONS FOOT Right 1999   right ankle   RIGHT ANKLE REPAIR Right 1989   ROBOTIC ASSISTED LAPAROSCOPIC LYSIS OF ADHESION N/A 02/03/2024   Procedure: LYSIS, ADHESIONS, ROBOT-ASSISTED, LAPAROSCOPIC;  Surgeon: Sheldon Standing, MD;  Location: [facility];  Service: General;  Laterality: N/A;   SALPINGOOPHORECTOMY  11/11/2011   Procedure: SALPINGO OOPHERECTOMY;  Surgeon: Peggye Gull, MD;  Location: [facility];  Service: Gynecology;  Laterality: Bilateral;   SINUS ENDO WITH FUSION Left 01/07/2021   Procedure: SINUS ENDOSCOPY WITH FUSION NAVIGATION;  Surgeon: Karis Clunes, MD;  Location: [facility];  Service: ENT;  Laterality: Left;   SPHENOIDECTOMY Left 01/07/2021   Procedure: LEFT ENDOSCOPIC  SPHENOIDECTOMY WITH TISSUE REMOVAL;  Surgeon: Karis Clunes, MD;  Location: [facility];  Service: ENT;  Laterality: Left;   TUBAL LIGATION  1974   VAGINAL HYSTERECTOMY N/A 09/09/2023   Procedure: HYSTERECTOMY VAGINAL;  Surgeon: Elisabeth Valli BIRCH, MD;  Location: [facility];  Service: Urology;  Laterality: N/A;    Social History   Socioeconomic History   Marital status: Divorced    Spouse name: Not on file   Number of children: 2   Years of education: Not on file   Highest education level: Not on file  Occupational History   Occupation: Scientist, 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[employer]     Comment:  retired  Tobacco Use   Smoking status: Never   Smokeless tobacco: Never  Vaping Use   Vaping status: Never Used  Substance and Sexual Activity   Alcohol  use: No   Drug use: Never   Sexual activity: Not on file  Other Topics Concern   Not on file  Social History Narrative   Not on file   Social Drivers of Health   Financial Resource Strain: Not on file  Food Insecurity: No Food Insecurity (02/03/2024)   Hunger Vital Sign    Worried About Running Out of Food in the Last Year: Never true    Ran Out of Food in the Last Year: Never true  Transportation Needs: No Transportation Needs (02/03/2024)   PRAPARE - Administrator, Civil Service (Medical): No    Lack of Transportation (Non-Medical): No  Physical Activity: Not on file  Stress: Not on file  Social Connections: Unknown (02/03/2024)   Social Connection and Isolation Panel    Frequency of Communication with Friends and Family: Not on file    Frequency of Social Gatherings with Friends and Family: Not on file    Attends Religious Services: Not on file    Active Member of Clubs or Organizations: Not on file    Attends Banker Meetings: Not on file    Marital Status: Divorced  Intimate Partner Violence: Not At Risk (02/03/2024)   Humiliation, Afraid, Rape, and Kick questionnaire    Fear of Current or  Ex-Partner: No    Emotionally Abused: No    Physically Abused: No    Sexually Abused: No    Family History  Problem Relation Age of Onset   Clotting disorder Mother        blood clot   Thyroid  disease Sister    Lung cancer Sister    Heart Problems Brother    Kidney disease Brother    Other Brother        cerebral hemorrhage   Colon cancer Neg Hx    Colon polyps Neg Hx    Esophageal cancer Neg Hx    Stomach cancer Neg Hx    Ulcerative colitis Neg Hx     Current Facility-Administered Medications  Medication Dose Route Frequency Provider Last Rate Last Admin   0.9 %  sodium chloride  infusion  250 mL  Intravenous PRN Sheldon Standing, MD   Stopped at 02/04/24 0518   acetaminophen  (TYLENOL ) tablet 1,000 mg  1,000 mg Oral Q6H Lilas Diefendorf, MD   1,000 mg at 02/05/24 0548   allopurinol  (ZYLOPRIM ) tablet 300 mg  300 mg Oral Daily Sheldon Standing, MD   300 mg at 02/04/24 0905   alum & mag hydroxide-simeth (MAALOX/MYLANTA) 200-200-20 MG/5ML suspension 30 mL  30 mL Oral Q6H PRN Sheldon Standing, MD       alvimopan  (ENTEREG ) capsule 12 mg  12 mg Oral BID Sheldon Standing, MD   12 mg at 02/04/24 2111   carvedilol  (COREG ) tablet 3.125 mg  3.125 mg Oral BID WC Sheldon Standing, MD   3.125 mg at 02/04/24 1710   diphenhydrAMINE  (BENADRYL ) 12.5 MG/5ML elixir 12.5 mg  12.5 mg Oral Q6H PRN Sheldon Standing, MD       Or   diphenhydrAMINE  (BENADRYL ) injection 12.5 mg  12.5 mg Intravenous Q6H PRN Sheldon Standing, MD       enoxaparin  (LOVENOX ) injection 40 mg  40 mg Subcutaneous Q24H Sheldon Standing, MD   40 mg at 02/04/24 0900   feeding supplement (ENSURE SURGERY) liquid 237 mL  237 mL Oral BID BM Sheldon Standing, MD   237 mL at 02/04/24 9094   gabapentin  (NEURONTIN ) capsule 200 mg  200 mg Oral QHS Tirso Laws, MD   200 mg at 02/04/24 2111   hydrALAZINE  (APRESOLINE ) injection 10 mg  10 mg Intravenous Q2H PRN Sheldon Standing, MD       HYDROmorphone  (DILAUDID ) injection 0.5-2 mg  0.5-2 mg Intravenous Q4H PRN Sheldon Standing, MD   1 mg at 02/03/24 1719   insulin  aspart (novoLOG ) injection 0-15 Units  0-15 Units Subcutaneous TID WC Sheldon Standing, MD   2 Units at 02/04/24 1710   insulin  aspart (novoLOG ) injection 0-5 Units  0-5 Units Subcutaneous QHS Earon Rivest, MD       lactated ringers  infusion   Intravenous Q8H PRN Sheldon Standing, MD       levothyroxine  (SYNTHROID ) tablet 75 mcg  75 mcg Oral Daily Sheldon Standing, MD   75 mcg at 02/05/24 0547   losartan  (COZAAR ) tablet 50 mg  50 mg Oral Daily Sheldon Standing, MD       magic mouthwash  15 mL Oral QID PRN Sheldon Standing, MD       melatonin tablet 3 mg  3 mg Oral QHS PRN Sheldon Standing, MD        menthol -cetylpyridinium (CEPACOL) lozenge 3 mg  1 lozenge Oral PRN Sheldon Standing, MD       methocarbamol  (ROBAXIN ) injection 1,000 mg  1,000 mg Intravenous Q6H PRN Sheldon Standing,  MD       methocarbamol  (ROBAXIN ) tablet 1,000 mg  1,000 mg Oral Q6H PRN Sheldon Standing, MD       metoprolol  tartrate (LOPRESSOR ) injection 5 mg  5 mg Intravenous Q6H PRN Sheldon Standing, MD       naphazoline-glycerin  (CLEAR EYES REDNESS) ophth solution 1-2 drop  1-2 drop Both Eyes QID PRN Sheldon Standing, MD       ondansetron  (ZOFRAN ) tablet 4 mg  4 mg Oral Q6H PRN Sheldon Standing, MD       Or   ondansetron  (ZOFRAN ) injection 4 mg  4 mg Intravenous Q6H PRN Sheldon Standing, MD       phenol (CHLORASEPTIC) mouth spray 2 spray  2 spray Mouth/Throat PRN Sheldon Standing, MD       prochlorperazine  (COMPAZINE ) tablet 10 mg  10 mg Oral Q6H PRN Sheldon Standing, MD       Or   prochlorperazine  (COMPAZINE ) injection 5-10 mg  5-10 mg Intravenous Q6H PRN Sheldon Standing, MD       psyllium (HYDROCIL/METAMUCIL) 1 packet  1 packet Oral BID Sheldon Standing, MD   1 packet at 02/04/24 2112   rosuvastatin  (CRESTOR ) tablet 20 mg  20 mg Oral Daily Sheldon Standing, MD   20 mg at 02/04/24 9095   simethicone  (MYLICON) chewable tablet 40 mg  40 mg Oral Q6H PRN Sheldon Standing, MD       sodium chloride  (OCEAN) 0.65 % nasal spray 1-2 spray  1-2 spray Each Nare Q6H PRN Sheldon Standing, MD       sodium chloride  flush (NS) 0.9 % injection 3 mL  3 mL Intravenous Q12H Qunicy Higinbotham, MD   3 mL at 02/04/24 2112   sodium chloride  flush (NS) 0.9 % injection 3 mL  3 mL Intravenous PRN Sheldon Standing, MD       traMADol  (ULTRAM ) tablet 50-100 mg  50-100 mg Oral Q6H PRN Sheldon Standing, MD   100 mg at 02/04/24 2114   triamterene -hydrochlorothiazide  (MAXZIDE -25) 37.5-25 MG per tablet 1 tablet  1 tablet Oral q morning Sheldon Standing, MD       Current Outpatient Medications  Medication Sig Dispense Refill   allopurinol  (ZYLOPRIM ) 300 MG tablet Take 300 mg by mouth in the morning.      Blood Glucose Monitoring Suppl (ONE TOUCH ULTRA 2) w/Device KIT Check blood sugar 1 time daily 1 kit 0   carvedilol  (COREG ) 3.125 MG tablet Take 3.125 mg by mouth 2 (two) times daily with a meal.     glucose blood (ONETOUCH VERIO) test strip Use as instructed to check blood sugar 1X daily 100 each 12   Lancets (ONETOUCH ULTRASOFT) lancets Use as instructed to check blood sugar 1X daily 100 each 12   levothyroxine  (SYNTHROID ) 75 MCG tablet Take 1 tablet (75 mcg total) by mouth daily. 45 tablet 3   losartan  (COZAAR ) 50 MG tablet Take 50 mg by mouth daily.     metFORMIN  (GLUCOPHAGE ) 500 MG tablet Take 500 mg by mouth daily with breakfast.     OneTouch Delica Lancets 30G MISC Check blood sugar 1 time daly 100 each 12   rosuvastatin  (CRESTOR ) 20 MG tablet Take 20 mg by mouth in the morning.     traMADol  (ULTRAM ) 50 MG tablet Take 1-2 tablets (50-100 mg total) by mouth every 6 (six) hours as needed for moderate pain (pain score 4-6) or severe pain (pain score 7-10). 20 tablet 0   triamterene -hydrochlorothiazide  (MAXZIDE -25) 37.5-25 MG tablet Take 1 tablet by mouth  every morning.     acetaminophen  (TYLENOL ) 650 MG CR tablet Take 650 mg by mouth every 8 (eight) hours as needed for pain.       No Known Allergies  Signed:   Elspeth KYM Schultze, MD, FACS, MASCRS Esophageal, Gastrointestinal & Colorectal Surgery Robotic and Minimally Invasive Surgery  [facility] [address], [address] [place], [place] [address] [phone] Fax [phone] Main  CONTACT INFORMATION: Weekday (9AM-5PM): Call [facility] main office at [phone] Weeknight (5PM-9AM) or Weekend/Holiday: Check EPIC Web Links tab & use AMION (password  [email or web address]) for General Surgery [facility] coverage  Please, DO NOT use SecureChat  (it is not reliable communication to reach operating surgeons & will lead to a delay in care).   Epic staff messaging available for outptient concerns needing  1-2 business day response.      02/05/2024, 10:02 AM

## 2024-02-09 ENCOUNTER — Other Ambulatory Visit: Payer: Self-pay | Admitting: Internal Medicine

## 2024-03-07 ENCOUNTER — Other Ambulatory Visit

## 2024-03-07 DIAGNOSIS — E039 Hypothyroidism, unspecified: Secondary | ICD-10-CM | POA: Diagnosis not present

## 2024-03-07 LAB — T4, FREE: Free T4: 1.6 ng/dL (ref 0.8–1.8)

## 2024-03-07 LAB — TSH: TSH: 0.22 m[IU]/L — ABNORMAL LOW (ref 0.40–4.50)

## 2024-03-08 ENCOUNTER — Encounter: Payer: Self-pay | Admitting: Pediatrics

## 2024-03-08 MED ORDER — LEVOTHYROXINE SODIUM 75 MCG PO TABS: ORAL_TABLET | ORAL | Status: DC

## 2024-03-23 DIAGNOSIS — Z79899 Other long term (current) drug therapy: Secondary | ICD-10-CM | POA: Diagnosis not present

## 2024-03-23 DIAGNOSIS — R918 Other nonspecific abnormal finding of lung field: Secondary | ICD-10-CM | POA: Diagnosis not present

## 2024-03-23 DIAGNOSIS — R911 Solitary pulmonary nodule: Secondary | ICD-10-CM | POA: Diagnosis not present

## 2024-04-18 ENCOUNTER — Other Ambulatory Visit

## 2024-04-18 DIAGNOSIS — E039 Hypothyroidism, unspecified: Secondary | ICD-10-CM | POA: Diagnosis not present

## 2024-04-19 LAB — TSH: TSH: 2.51 m[IU]/L (ref 0.40–4.50)

## 2024-04-19 LAB — T4, FREE: Free T4: 1.2 ng/dL (ref 0.8–1.8)

## 2024-04-25 ENCOUNTER — Ambulatory Visit (INDEPENDENT_AMBULATORY_CARE_PROVIDER_SITE_OTHER): Admitting: Otolaryngology

## 2024-04-25 ENCOUNTER — Encounter (INDEPENDENT_AMBULATORY_CARE_PROVIDER_SITE_OTHER): Payer: Self-pay | Admitting: Otolaryngology

## 2024-04-25 VITALS — BP 100/66 | HR 68 | Temp 98.9°F | Ht 65.0 in | Wt 192.0 lb

## 2024-04-25 DIAGNOSIS — H6123 Impacted cerumen, bilateral: Secondary | ICD-10-CM

## 2024-04-25 DIAGNOSIS — J343 Hypertrophy of nasal turbinates: Secondary | ICD-10-CM

## 2024-04-25 DIAGNOSIS — J310 Chronic rhinitis: Secondary | ICD-10-CM | POA: Diagnosis not present

## 2024-04-25 DIAGNOSIS — H608X3 Other otitis externa, bilateral: Secondary | ICD-10-CM

## 2024-04-25 DIAGNOSIS — R0981 Nasal congestion: Secondary | ICD-10-CM

## 2024-04-25 MED ORDER — MOMETASONE FUROATE 0.1 % EX CREA: TOPICAL_CREAM | CUTANEOUS | 3 refills | Status: AC

## 2024-04-25 NOTE — Progress Notes (Unsigned)
 Patient ID: Kathryn Diaz, female   DOB: Aug 23, 1950, 73 y.o.   MRN: [id number]  Follow-up: Chronic left sphenoid sinusitis and fungus ball, recurrent cerumen impaction  HPI: The patient is a 73 year old female who returns today for her follow-up evaluation.  She has a history of chronic left sphenoid sinusitis and fungus ball.  She underwent endoscopic sinus surgery in June of 2022.  At her last visit, the patient was noted to have nasal mucosal congestion and bilateral inferior turbinate hypertrophy.  No acute infection was noted at her last visit.  She was treated with nasal saline irrigation as needed.  She denies any nasal drainage, fever, or visual change.  Exam: General: Communicates without difficulty, well nourished, no acute distress. Head: Normocephalic, no evidence injury, no tenderness, facial buttresses intact without stepoff. Face/sinus: No tenderness to palpation and percussion. Facial movement is normal and symmetric. Eyes: PERRL, EOMI. No scleral icterus, conjunctivae clear. Neuro: CN II exam reveals vision grossly intact.  No nystagmus at any point of gaze. Ears: Auricles well formed without lesions.  Bilateral cerumen impaction.  Nose: External evaluation reveals normal support and skin without lesions.  Dorsum is intact.  Anterior rhinoscopy reveals congested mucosa over anterior aspect of inferior turbinates and intact septum.  No purulence noted. Oral:  Oral cavity and oropharynx are intact, symmetric, without erythema or edema.  Mucosa is moist without lesions. Neck: Full range of motion without pain.  There is no significant lymphadenopathy.  No masses palpable.  Thyroid  bed within normal limits to palpation.  Parotid glands and submandibular glands equal bilaterally without mass.  Trachea is midline. Neuro:  CN 2-12 grossly intact.   Procedure: Bilateral cerumen disimpaction Anesthesia: None Description: Under the operating microscope, the cerumen is carefully removed with a  combination of cerumen currette, alligator forceps, and suction catheters.  After the cerumen is removed, the TMs are noted to be normal.  No mass, erythema, or lesions. The patient tolerated the procedure well.    Assessment: 1.  Bilateral cerumen impaction.  After the disimpaction procedure, both tympanic membranes and middle ear spaces are noted to be normal. 2.  Chronic rhinitis with nasal mucosal congestion and bilateral inferior turbinate hypertrophy. 3.  No acute or chronic sinusitis is noted today.  Plan: 1.  Otomicroscopy with bilateral cerumen disimpaction. 2.  The physical exam findings are reviewed with the patient. 3.  The patient is reassured that no acute or chronic sinusitis is noted today. 4.  Nasal saline irrigation and Flonase nasal spray as needed. 5.  The patient will return for reevaluation in 6 months.

## 2024-04-26 DIAGNOSIS — H608X3 Other otitis externa, bilateral: Secondary | ICD-10-CM | POA: Insufficient documentation

## 2024-05-03 DIAGNOSIS — M25562 Pain in left knee: Secondary | ICD-10-CM | POA: Diagnosis not present

## 2024-05-12 DIAGNOSIS — H35033 Hypertensive retinopathy, bilateral: Secondary | ICD-10-CM | POA: Diagnosis not present

## 2024-05-12 DIAGNOSIS — R7309 Other abnormal glucose: Secondary | ICD-10-CM | POA: Diagnosis not present

## 2024-05-12 DIAGNOSIS — R519 Headache, unspecified: Secondary | ICD-10-CM | POA: Diagnosis not present

## 2024-05-12 DIAGNOSIS — H25813 Combined forms of age-related cataract, bilateral: Secondary | ICD-10-CM | POA: Diagnosis not present

## 2024-05-23 DIAGNOSIS — Z1231 Encounter for screening mammogram for malignant neoplasm of breast: Secondary | ICD-10-CM | POA: Diagnosis not present

## 2024-05-31 DIAGNOSIS — Z1272 Encounter for screening for malignant neoplasm of vagina: Secondary | ICD-10-CM | POA: Diagnosis not present

## 2024-05-31 DIAGNOSIS — Z01419 Encounter for gynecological examination (general) (routine) without abnormal findings: Secondary | ICD-10-CM | POA: Diagnosis not present

## 2024-06-29 DIAGNOSIS — R911 Solitary pulmonary nodule: Secondary | ICD-10-CM | POA: Diagnosis not present

## 2024-06-29 DIAGNOSIS — R918 Other nonspecific abnormal finding of lung field: Secondary | ICD-10-CM | POA: Diagnosis not present

## 2024-07-09 ENCOUNTER — Other Ambulatory Visit: Payer: Self-pay | Admitting: Internal Medicine

## 2024-07-13 DIAGNOSIS — R911 Solitary pulmonary nodule: Secondary | ICD-10-CM | POA: Diagnosis not present

## 2024-07-14 ENCOUNTER — Other Ambulatory Visit: Payer: Self-pay | Admitting: Internal Medicine

## 2024-07-18 ENCOUNTER — Ambulatory Visit (INDEPENDENT_AMBULATORY_CARE_PROVIDER_SITE_OTHER): Admitting: Internal Medicine

## 2024-07-18 ENCOUNTER — Encounter: Payer: Self-pay | Admitting: Internal Medicine

## 2024-07-18 ENCOUNTER — Other Ambulatory Visit

## 2024-07-18 VITALS — BP 120/70 | HR 63 | Ht 65.0 in | Wt 197.4 lb

## 2024-07-18 DIAGNOSIS — E1151 Type 2 diabetes mellitus with diabetic peripheral angiopathy without gangrene: Secondary | ICD-10-CM | POA: Diagnosis not present

## 2024-07-18 DIAGNOSIS — E119 Type 2 diabetes mellitus without complications: Secondary | ICD-10-CM

## 2024-07-18 DIAGNOSIS — E039 Hypothyroidism, unspecified: Secondary | ICD-10-CM

## 2024-07-18 DIAGNOSIS — N189 Chronic kidney disease, unspecified: Secondary | ICD-10-CM | POA: Diagnosis not present

## 2024-07-18 DIAGNOSIS — E1122 Type 2 diabetes mellitus with diabetic chronic kidney disease: Secondary | ICD-10-CM

## 2024-07-18 LAB — POCT GLYCOSYLATED HEMOGLOBIN (HGB A1C): Hemoglobin A1C: 5.9 % — AB (ref 4.0–5.6)

## 2024-07-18 MED ORDER — ONETOUCH VERIO VI STRP: ORAL_STRIP | 12 refills | Status: AC

## 2024-07-18 MED ORDER — ONETOUCH DELICA PLUS LANCET33G MISC: 3 refills | Status: AC

## 2024-07-18 NOTE — Patient Instructions (Addendum)
 Please continue off Metformin .  Continue Levothyroxine  75 mcg 6/7 days and 37.5 mcg 1/7 days.  Take the thyroid  hormone every day, with water , at least 30 minutes before breakfast, separated by at least 4 hours from: - acid reflux medications - calcium  - iron - multivitamins  Please stop at the lab.  Please return for another visit in 6 months.

## 2024-07-18 NOTE — Progress Notes (Addendum)
 Patient ID: Kathryn Diaz, female   DOB: [birth year], 73 y.o.   MRN: [id number]  HPI: Kathryn Diaz is a 73 y.o.-year-old female, returning for follow-up for diabetes type 2, dx in 1995, non-insulin -dependent, controlled, with complications (peripheral vascular disease, CKD) and also uncontrolled hypothyroidism. Pt. previously saw Dr. Kassie, but last visit with me 6 months ago.  Interim history: No increased urination, blurry vision, nausea, chest pain.  Before last visit, she had TAH 09/09/2023.  She  then had rectal bleeding. She had 2 colonoscopies - has a mass (benign) -she had a laparoscopic partial colectomy 02/03/2024. During the above investigation, she was found to have a lung nodule.  Since last visit, she had bronchoscopy 07/13/2024: No cancer found.  Reviewed history: Reviewing her chart, she appears to have diabetes as an entry (patient tells me that she has a history of prediabetes).  DM2: Reviewed HbA1c:  Lab Results  Component Value Date   HGBA1C 6.0 (H) 02/03/2024   HGBA1C 5.9 (A) 01/15/2024   HGBA1C 6.1 (A) 01/15/2023   HGBA1C 5.8 (A) 07/15/2022   HGBA1C 6.2 (A) 02/28/2022   HGBA1C 6.6 (H) 11/14/2021  07/17/2023: HbA1c 6.2%  Pt is on a regimen of: - Metformin  ER 500 mg 1x a day, with a meal >> now off for the last few mo  Pt is checking sugars once a day: - am: 90-110 >> 99-111 >> 96 >> 90s-112 >> 100, 128 - 2h after b'fast: n/c >> 103 >> n/c - before lunch: n/c>> 67 >> 100, 110 >> n/c - 2h after lunch: 124 >> n/c >> 87, 98 >> 112-129 >> n/c - before dinner: n/c - 2h after dinner: n/c >> 99-125 >> 90-100 >> n/c  - bedtime: n/c >> 85, 123 - nighttime: n/c  Glucometer: none >> One Touch Verio  - + CKD, last BUN/creatinine: Lab Results  Component Value Date   BUN 13 02/04/2024   BUN 19 01/21/2024   CREATININE 1.05 (H) 02/04/2024   CREATININE 1.07 (H) 01/21/2024    Lab Results  Component Value Date   MICRALBCREAT NOTE 07/17/2023  She is on losartan   50 mg daily.  -+ hyperlipidemia; last set of lipids: 11/02/2023: 133/64/47/73 09/09/2022: 150/80/49/85 Lab Results  Component Value Date   CHOL 153 02/28/2022   HDL 55.10 02/28/2022   LDLCALC 87 02/28/2022   TRIG 53.0 02/28/2022   CHOLHDL 3 02/28/2022  Per review of Dr. Ranee note: 08/01/2021: total cholesterol 166, LDL 107 and HDL 42 She was on simvastatin 80 mg daily >> changed to Rosuvastatin  20 mg daily.  - last eye exam was 05/12/2024: No DR reportedly.  - no numbness and tingling in her feet. Sees [facility], but last foot exam was 01/15/2024 here in clinic.  Uncontrolled hypothyroidism:  Pt is on levothyroxine  75 mcg 6/7 days and 37.5 mcg 1/7 days (decreased 02/2024): - in am - fasting - 1h from b'fast - no calcium  - no iron - no multivitamins - stopped PPIs 12/2021 (was taking it in am) - not on Biotin  Reviewed her TFTs: Lab Results  Component Value Date   TSH 2.51 04/18/2024   TSH 0.22 (L) 03/07/2024   TSH 0.11 (L) 01/15/2024   TSH 2.96 01/14/2023   TSH 0.50 09/26/2022   TSH 0.03 (L) 07/15/2022   TSH 1.00 02/28/2022   TSH 26.13 (H) 11/14/2021   TSH 27.60 (H) 09/12/2021   TSH 0.300 (L) 01/16/2012  11/03/2023: TSH 0.302  She has a history of benign thyroid   nodule biopsy in 1998.  She does have a history of OSA-on CPAP, HTN, breast cancer-1982, HAs. In 2023, she started a healthy eating class at her [facility]. She improved her diet by reducing carbs and stopped sweet tea.   ROS: + see HPI  Past Medical History:  Diagnosis Date   Anemia    Arthritis    Cancer (HCC) 1982   Breast Cancer   Cervicalgia    Chronic gout    04-01-2023  pt stated last episode > 20 yrs ago   CKD (chronic kidney disease), stage III (HCC)    Cystocele with uterine prolapse    Headache(784.0)    History of adenomatous polyp of colon    GI-- dr charlanne   History of hiatal hernia    History of right breast cancer 1982   Hyperlipidemia, mixed    Hypertension     Hypothyroidism    endocrinologist-- dr trixie;  hx benign nodule bx 1998   Mild obstructive sleep apnea    09-03-2023  per pt last used oral appliance since after 09/ 2024 had issue w/ appliance then issue w/ medicare had not got fixed yet   Mobitz type 1 second degree atrioventricular block 12/2011   cardiologist--- dr court;  dx 06/ 2013  w/ bradycardia;  nuclear stress test 01-23-2012  normal perfusion no ischemia, nuclear ef 66%;  echo 01-16-2012 ef 60-65%, G1DD, mild MR/ TR   Type 2 diabetes mellitus [facility])    endocrinologist--- dr trixie;   (09-03-2023 per pt check blood sugar 2 times wkly)   Urethral hypermobility    Past Surgical History:  Procedure Laterality Date   BLADDER SUSPENSION N/A 09/09/2023   Procedure: TRANSVAGINAL TAPE (TVT) PROCEDURE;  Surgeon: Elisabeth Valli BIRCH, MD;  Location: [facility];  Service: Urology;  Laterality: N/A;   BREAST RECONSTRUCTION Right 1983   COLONOSCOPY  12/2017   dr kemper   CYSTO WITH HYDRODISTENSION  09/09/2023   Procedure: CYSTOSCOPY/;  Surgeon: Elisabeth Valli BIRCH, MD;  Location: [facility];  Service: Urology;;   CYSTOCELE REPAIR N/A 09/09/2023   Procedure: ANTERIOR REPAIR (CYSTOCELE);  Surgeon: Elisabeth Valli BIRCH, MD;  Location: [facility];  Service: Urology;  Laterality: N/A;   CYSTOSCOPY N/A 09/09/2023   Procedure: CYSTOSCOPY;  Surgeon: Elisabeth Valli BIRCH, MD;  Location: [facility];  Service: Urology;  Laterality: N/A;   HYSTEROSCOPY WITH D & C N/A 04/08/2023   Procedure: DILATATION AND CURETTAGE /HYSTEROSCOPY;  Surgeon: Sarrah Browning, MD;  Location: [facility];  Service: Gynecology;  Laterality: N/A;   LAPAROSCOPY  11/11/2011   Procedure: LAPAROSCOPY OPERATIVE;  Surgeon: Peggye Gull, MD;  Location: [facility];  Service: Gynecology;  Laterality: N/A;   MASTECTOMY Right 1982   RIGHT BREAST    REPAIR TENDONS FOOT Right 1999   right ankle   RIGHT ANKLE REPAIR Right 1989    ROBOTIC ASSISTED LAPAROSCOPIC LYSIS OF ADHESION N/A 02/03/2024   Procedure: LYSIS, ADHESIONS, ROBOT-ASSISTED, LAPAROSCOPIC;  Surgeon: Sheldon Standing, MD;  Location: [facility];  Service: General;  Laterality: N/A;   SALPINGOOPHORECTOMY  11/11/2011   Procedure: SALPINGO OOPHERECTOMY;  Surgeon: Peggye Gull, MD;  Location: [facility];  Service: Gynecology;  Laterality: Bilateral;   SINUS ENDO WITH FUSION Left 01/07/2021   Procedure: SINUS ENDOSCOPY WITH FUSION NAVIGATION;  Surgeon: Karis Clunes, MD;  Location: [facility];  Service: ENT;  Laterality: Left;   SPHENOIDECTOMY Left 01/07/2021   Procedure: LEFT ENDOSCOPIC  SPHENOIDECTOMY WITH  TISSUE REMOVAL;  Surgeon: Karis Clunes, MD;  Location:  [facility];  Service: ENT;  Laterality: Left;   TUBAL LIGATION  1974   VAGINAL HYSTERECTOMY N/A 09/09/2023   Procedure: HYSTERECTOMY VAGINAL;  Surgeon: Elisabeth Valli BIRCH, MD;  Location: [facility];  Service: Urology;  Laterality: N/A;   Social History   Socioeconomic History   Marital status: Divorced    Spouse name: Not on file   Number of children: 2   Years of education: Not on file   Highest education level: Not on file  Occupational History   Occupation: scientist, 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[employer]     Comment: retired  Tobacco Use   Smoking status: Never   Smokeless tobacco: Never  Vaping Use   Vaping status: Never Used  Substance and Sexual Activity   Alcohol  use: No   Drug use: Never   Sexual activity: Not on file  Other Topics Concern   Not on file  Social History Narrative   Not on file   Social Drivers of Health   Tobacco Use: Low Risk  (07/13/2024)   Received from [facility]   Patient History    Smoking Tobacco Use: Never    Smokeless Tobacco Use: Never    Passive Exposure: Past  Financial Resource Strain: Not on file  Food Insecurity: No Food Insecurity (02/03/2024)   Epic    Worried About Programme Researcher, Broadcasting/film/video in  the Last Year: Never true    Ran Out of Food in the Last Year: Never true  Transportation Needs: No Transportation Needs (02/03/2024)   Epic    Lack of Transportation (Medical): No    Lack of Transportation (Non-Medical): No  Physical Activity: Not on file  Stress: Not on file  Social Connections: Unknown (02/03/2024)   Social Connection and Isolation Panel    Frequency of Communication with Friends and Family: Not on file    Frequency of Social Gatherings with Friends and Family: Not on file    Attends Religious Services: Not on file    Active Member of Clubs or Organizations: Not on file    Attends Banker Meetings: Not on file    Marital Status: Divorced  Intimate Partner Violence: Not At Risk (02/03/2024)   Epic    Fear of Current or Ex-Partner: No    Emotionally Abused: No    Physically Abused: No    Sexually Abused: No  Depression (PHQ2-9): Not on file  Alcohol  Screen: Not on file  Housing: Low Risk (02/03/2024)   Epic    Unable to Pay for Housing in the Last Year: No    Number of Times Moved in the Last Year: 0    Homeless in the Last Year: No  Utilities: Not At Risk (02/03/2024)   Epic    Threatened with loss of utilities: No  Health Literacy: Not on file   Current Outpatient Medications on File Prior to Visit  Medication Sig Dispense Refill   acetaminophen  (TYLENOL ) 650 MG CR tablet Take 650 mg by mouth every 8 (eight) hours as needed for pain.     allopurinol  (ZYLOPRIM ) 300 MG tablet Take 300 mg by mouth in the morning.     Blood Glucose Monitoring Suppl (ONETOUCH VERIO FLEX SYSTEM) w/Device KIT USE AS DIRECTED TO  CHECK  BLOOD  SUGAR  ONCE  DAILY 1 kit 0   carvedilol  (COREG ) 3.125 MG tablet Take 3.125 mg by  mouth 2 (two) times daily with a meal.     glucose blood (ONETOUCH VERIO) test strip Use as instructed to check blood sugar 1X daily 100 each 12   Lancets (ONETOUCH ULTRASOFT) lancets Use as instructed to check blood sugar 1X daily 100 each 12    levothyroxine  (SYNTHROID ) 75 MCG tablet Take 1 tablet by mouth once daily 90 tablet 1   losartan  (COZAAR ) 50 MG tablet Take 50 mg by mouth daily.     metFORMIN  (GLUCOPHAGE ) 500 MG tablet Take 500 mg by mouth daily with breakfast.     mometasone  (ELOCON ) 0.1 % cream Apply topically daily as needed for itch. 15 g 3   rosuvastatin  (CRESTOR ) 20 MG tablet Take 20 mg by mouth in the morning.     traMADol  (ULTRAM ) 50 MG tablet Take 1-2 tablets (50-100 mg total) by mouth every 6 (six) hours as needed for moderate pain (pain score 4-6) or severe pain (pain score 7-10). 20 tablet 0   triamterene -hydrochlorothiazide  (MAXZIDE -25) 37.5-25 MG tablet Take 1 tablet by mouth every morning.     No current facility-administered medications on file prior to visit.   No Known Allergies Family History  Problem Relation Age of Onset   Clotting disorder Mother        blood clot   Thyroid  disease Sister    Lung cancer Sister    Heart Problems Brother    Kidney disease Brother    Other Brother        cerebral hemorrhage   Colon cancer Neg Hx    Colon polyps Neg Hx    Esophageal cancer Neg Hx    Stomach cancer Neg Hx    Ulcerative colitis Neg Hx    PE: BP 120/70   Pulse 63   Ht 5' 5 (1.651 m)   Wt 197 lb 6.4 oz (89.5 kg)   SpO2 98%   BMI 32.85 kg/m  Wt Readings from Last 15 Encounters:  07/18/24 197 lb 6.4 oz (89.5 kg)  04/25/24 192 lb (87.1 kg)  02/05/24 221 lb 5.5 oz (100.4 kg)  01/21/24 199 lb (90.3 kg)  01/15/24 199 lb 3.2 oz (90.4 kg)  12/17/23 209 lb (94.8 kg)  12/08/23 209 lb (94.8 kg)  11/06/23 209 lb (94.8 kg)  10/15/23 203 lb (92.1 kg)  09/09/23 206 lb 9.6 oz (93.7 kg)  07/17/23 209 lb (94.8 kg)  06/09/23 211 lb 2 oz (95.8 kg)  04/08/23 203 lb 14.4 oz (92.5 kg)  02/13/23 204 lb 12.8 oz (92.9 kg)  01/15/23 203 lb (92.1 kg)   Constitutional: overweight, in NAD Eyes: no exophthalmos ENT: no thyromegaly, no cervical lymphadenopathy Cardiovascular: RRR, No MRG Respiratory: CTA  B Musculoskeletal: no deformities Skin: no rashes Neurological: no tremor with outstretched hands  ASSESSMENT: DM2, controlled, with complications - PVD - CKD  2.  Uncontrolled hypothyroidism  PLAN:  1. Patient with history of controlled diabetes, on oral antidiabetic regimen with low-dose metformin , 500 mg daily, with good control.  At last visit, HbA1c was lower, at 5.9% but she had another HbA1c that was slightly higher at 6.0% in 01/2024 and 5.8% in 04/2024. - At last visit, sugars were all at goal.  She inquired about the metformin  safety with CKD and we discussed that metformin  may even help with CKD outcomes.  We continued the lower dose. - However, at today's visit she tells me that she did stop metformin  due to fear of interference with CKD.  We again discussed about the contraindication  to metformin  only in end-stage renal disease patient and the benefit in other patients with CKD.  However, due to the excellent HbA1c today, we discussed about staying off the metformin  for now. I advised her to let me know if the sugars worsen before next visit, and we will start it at that point.  She agrees with this plan. - I suggested to:  Patient Instructions  Please continue off Metformin .  Continue Levothyroxine  75 mcg 6/7 days and 37.5 mcg 1/7 days.  Take the thyroid  hormone every day, with water , at least 30 minutes before breakfast, separated by at least 4 hours from: - acid reflux medications - calcium  - iron - multivitamins  Please stop at the lab.  Please return for another visit in 6 months.  - advised to check sugars at different times of the day - 1x a day, rotating check times - advised for yearly eye exams >> she is UTD - return to clinic in 4-6 months  2.  Uncontrolled hypothyroidism - latest thyroid  labs reviewed with pt. >> normal: Lab Results  Component Value Date   TSH 2.51 04/18/2024  - She is now on LT4 75 mcg 6/7 days and 37.5 mcg 1/7 days, dose decreased  02/2024 - pt feels good on this dose. - we discussed about taking the thyroid  hormone every day, with water , >30 minutes before breakfast, separated by >4 hours from acid reflux medications, calcium , iron, multivitamins. Pt. is taking it correctly. - will check thyroid  tests today: TSH and fT4 - If labs are abnormal, she will need to return for repeat TFTs in 1.5 months  Component     Latest Ref Rng 07/18/2024  TSH     0.40 - 4.50 mIU/L 6.68 (H)   T4,Free(Direct)     0.8 - 1.8 ng/dL 1.2   Hemoglobin J8R     4.0 - 5.6 % 5.9 !   TSH is elevated.  Will go ahead and increase the dose of levothyroxine  to 1 full tablet of 75 mcg daily and recheck the tests in 1.5 months.  Lela Fendt, MD PhD [facility]

## 2024-07-19 ENCOUNTER — Ambulatory Visit: Payer: Self-pay | Admitting: Internal Medicine

## 2024-07-19 LAB — T4, FREE: Free T4: 1.2 ng/dL (ref 0.8–1.8)

## 2024-07-19 LAB — TSH: TSH: 6.68 m[IU]/L — ABNORMAL HIGH (ref 0.40–4.50)

## 2024-07-19 NOTE — Addendum Note (Signed)
 Addended by: TRIXIE FILE on: 07/19/2024 03:04 PM   Modules accepted: Orders

## 2024-08-30 ENCOUNTER — Other Ambulatory Visit

## 2024-09-01 ENCOUNTER — Other Ambulatory Visit

## 2024-09-08 ENCOUNTER — Other Ambulatory Visit

## 2024-10-17 ENCOUNTER — Ambulatory Visit (INDEPENDENT_AMBULATORY_CARE_PROVIDER_SITE_OTHER): Admitting: Otolaryngology

## 2025-01-16 ENCOUNTER — Ambulatory Visit: Admitting: Internal Medicine
